# Patient Record
Sex: Female | Born: 1975 | Race: White | Hispanic: Yes | Marital: Married | Smoking: Never smoker
Health system: Southern US, Community
[De-identification: ages and names within clinical notes are randomized; demographics above are authoritative.]

## PROBLEM LIST (undated history)

## (undated) ENCOUNTER — Ambulatory Visit: Admission: EM | Payer: Self-pay | Source: Home / Self Care

## (undated) DIAGNOSIS — F32A Depression, unspecified: Secondary | ICD-10-CM

## (undated) DIAGNOSIS — K649 Unspecified hemorrhoids: Secondary | ICD-10-CM

## (undated) DIAGNOSIS — E7800 Pure hypercholesterolemia, unspecified: Secondary | ICD-10-CM

## (undated) DIAGNOSIS — F419 Anxiety disorder, unspecified: Secondary | ICD-10-CM

## (undated) DIAGNOSIS — F329 Major depressive disorder, single episode, unspecified: Secondary | ICD-10-CM

## (undated) DIAGNOSIS — R300 Dysuria: Secondary | ICD-10-CM

## (undated) HISTORY — DX: Pure hypercholesterolemia, unspecified: E78.00

## (undated) HISTORY — DX: Unspecified hemorrhoids: K64.9

## (undated) HISTORY — PX: HEMORRHOID SURGERY: SHX153

## (undated) HISTORY — DX: Dysuria: R30.0

---

## 2002-12-22 ENCOUNTER — Encounter: Payer: Self-pay | Admitting: Internal Medicine

## 2002-12-22 ENCOUNTER — Encounter: Admission: RE | Admit: 2002-12-22 | Discharge: 2002-12-22 | Payer: Self-pay | Admitting: Internal Medicine

## 2005-09-06 ENCOUNTER — Ambulatory Visit: Payer: Self-pay | Admitting: Internal Medicine

## 2005-09-10 ENCOUNTER — Ambulatory Visit: Payer: Self-pay | Admitting: *Deleted

## 2005-09-10 ENCOUNTER — Ambulatory Visit: Payer: Self-pay | Admitting: Nurse Practitioner

## 2005-09-11 ENCOUNTER — Ambulatory Visit (HOSPITAL_COMMUNITY): Admission: RE | Admit: 2005-09-11 | Discharge: 2005-09-11 | Payer: Self-pay | Admitting: Internal Medicine

## 2005-09-27 ENCOUNTER — Ambulatory Visit: Payer: Self-pay | Admitting: Internal Medicine

## 2005-10-25 ENCOUNTER — Ambulatory Visit: Payer: Self-pay | Admitting: Nurse Practitioner

## 2005-11-04 ENCOUNTER — Ambulatory Visit: Payer: Self-pay | Admitting: Nurse Practitioner

## 2005-11-11 ENCOUNTER — Ambulatory Visit: Payer: Self-pay | Admitting: Internal Medicine

## 2006-02-17 ENCOUNTER — Ambulatory Visit: Payer: Self-pay | Admitting: Nurse Practitioner

## 2006-03-03 ENCOUNTER — Ambulatory Visit: Payer: Self-pay | Admitting: Nurse Practitioner

## 2006-03-25 ENCOUNTER — Ambulatory Visit: Payer: Self-pay | Admitting: Nurse Practitioner

## 2006-04-09 ENCOUNTER — Ambulatory Visit: Payer: Self-pay | Admitting: Internal Medicine

## 2006-05-13 ENCOUNTER — Ambulatory Visit: Payer: Self-pay | Admitting: Internal Medicine

## 2006-06-10 ENCOUNTER — Ambulatory Visit: Payer: Self-pay | Admitting: Nurse Practitioner

## 2006-07-14 ENCOUNTER — Ambulatory Visit: Payer: Self-pay | Admitting: Internal Medicine

## 2006-07-30 ENCOUNTER — Ambulatory Visit: Payer: Self-pay | Admitting: Nurse Practitioner

## 2006-08-04 ENCOUNTER — Encounter: Admission: RE | Admit: 2006-08-04 | Discharge: 2006-08-04 | Payer: Self-pay | Admitting: Family Medicine

## 2007-05-06 ENCOUNTER — Encounter (INDEPENDENT_AMBULATORY_CARE_PROVIDER_SITE_OTHER): Payer: Self-pay | Admitting: *Deleted

## 2007-08-31 ENCOUNTER — Ambulatory Visit: Payer: Self-pay | Admitting: Internal Medicine

## 2007-09-28 ENCOUNTER — Ambulatory Visit: Payer: Self-pay | Admitting: Internal Medicine

## 2007-09-29 ENCOUNTER — Ambulatory Visit (HOSPITAL_COMMUNITY): Admission: RE | Admit: 2007-09-29 | Discharge: 2007-09-29 | Payer: Self-pay | Admitting: Internal Medicine

## 2007-12-10 ENCOUNTER — Ambulatory Visit: Payer: Self-pay | Admitting: Internal Medicine

## 2008-06-16 ENCOUNTER — Ambulatory Visit: Payer: Self-pay | Admitting: Internal Medicine

## 2008-06-20 ENCOUNTER — Ambulatory Visit: Payer: Self-pay | Admitting: *Deleted

## 2008-10-05 ENCOUNTER — Ambulatory Visit: Payer: Self-pay | Admitting: Internal Medicine

## 2008-11-14 ENCOUNTER — Encounter (INDEPENDENT_AMBULATORY_CARE_PROVIDER_SITE_OTHER): Payer: Self-pay | Admitting: General Surgery

## 2008-11-14 ENCOUNTER — Ambulatory Visit (HOSPITAL_BASED_OUTPATIENT_CLINIC_OR_DEPARTMENT_OTHER): Admission: RE | Admit: 2008-11-14 | Discharge: 2008-11-14 | Payer: Self-pay | Admitting: General Surgery

## 2009-01-10 ENCOUNTER — Ambulatory Visit: Payer: Self-pay | Admitting: Internal Medicine

## 2009-02-06 ENCOUNTER — Ambulatory Visit: Payer: Self-pay | Admitting: Internal Medicine

## 2009-02-15 ENCOUNTER — Ambulatory Visit: Payer: Self-pay | Admitting: Internal Medicine

## 2009-06-23 ENCOUNTER — Ambulatory Visit: Payer: Self-pay | Admitting: Internal Medicine

## 2009-07-24 ENCOUNTER — Ambulatory Visit: Payer: Self-pay | Admitting: Internal Medicine

## 2009-08-09 ENCOUNTER — Telehealth (INDEPENDENT_AMBULATORY_CARE_PROVIDER_SITE_OTHER): Payer: Self-pay | Admitting: *Deleted

## 2009-09-07 ENCOUNTER — Ambulatory Visit: Payer: Self-pay | Admitting: Internal Medicine

## 2009-09-25 ENCOUNTER — Ambulatory Visit: Payer: Self-pay | Admitting: Internal Medicine

## 2009-09-25 LAB — CONVERTED CEMR LAB
Cholesterol: 210 mg/dL — ABNORMAL HIGH (ref 0–200)
HDL: 58 mg/dL (ref >39)
LDL Cholesterol: 110 mg/dL — ABNORMAL HIGH (ref 0–99)
Total CHOL/HDL Ratio: 3.6
Triglycerides: 211 mg/dL — ABNORMAL HIGH (ref <150)
VLDL: 42 mg/dL — ABNORMAL HIGH (ref 0–40)

## 2009-10-05 ENCOUNTER — Ambulatory Visit: Payer: Self-pay | Admitting: Internal Medicine

## 2009-11-01 ENCOUNTER — Ambulatory Visit: Payer: Self-pay | Admitting: Internal Medicine

## 2009-11-01 LAB — CONVERTED CEMR LAB: Helicobacter Pylori Antibody-IgG: 3.9 — ABNORMAL HIGH

## 2009-12-21 ENCOUNTER — Ambulatory Visit: Payer: Self-pay | Admitting: Internal Medicine

## 2010-03-07 ENCOUNTER — Ambulatory Visit: Payer: Self-pay | Admitting: Internal Medicine

## 2010-03-08 ENCOUNTER — Encounter (INDEPENDENT_AMBULATORY_CARE_PROVIDER_SITE_OTHER): Payer: Self-pay | Admitting: Internal Medicine

## 2010-03-27 ENCOUNTER — Encounter (INDEPENDENT_AMBULATORY_CARE_PROVIDER_SITE_OTHER): Payer: Self-pay | Admitting: Family Medicine

## 2010-03-27 ENCOUNTER — Ambulatory Visit: Payer: Self-pay | Admitting: Internal Medicine

## 2010-06-18 ENCOUNTER — Encounter (INDEPENDENT_AMBULATORY_CARE_PROVIDER_SITE_OTHER): Payer: Self-pay | Admitting: *Deleted

## 2010-06-18 LAB — CONVERTED CEMR LAB
ALT: 18 U/L (ref 0–35)
AST: 15 U/L (ref 0–37)
Albumin: 4.3 g/dL (ref 3.5–5.2)
Alkaline Phosphatase: 43 U/L (ref 39–117)
BUN: 12 mg/dL (ref 6–23)
CO2: 27 meq/L (ref 19–32)
Calcium: 9.4 mg/dL (ref 8.4–10.5)
Chloride: 100 meq/L (ref 96–112)
Creatinine, Ser: 0.59 mg/dL (ref 0.40–1.20)
Glucose, Bld: 83 mg/dL (ref 70–99)
Potassium: 4.1 meq/L (ref 3.5–5.3)
Sodium: 138 meq/L (ref 135–145)
Total Bilirubin: 0.3 mg/dL (ref 0.3–1.2)
Total Protein: 7.1 g/dL (ref 6.0–8.3)

## 2010-08-19 NOTE — L&D Delivery Note (Signed)
Delivery Note At  a viable and healthy female was delivered via SVD (Presentation: LOA ).  APGAR: 9,9 ; weight 3,714 g .   Placenta status: spontaneous , via Tomasa Blase.  Cord: 3 vessels  with the following complications: None    Anesthesia:  None Episiotomy: None  Lacerations: None  Est. Blood Loss (mL): 250 mL  Mom to postpartum.  Baby to nursery-stable.  Jennifer Noble 05/26/2011, 9:00 PM

## 2010-09-09 ENCOUNTER — Encounter: Payer: Self-pay | Admitting: Internal Medicine

## 2010-09-19 NOTE — Miscellaneous (Signed)
 Summary: VIP  Patient: Jennifer Noble Note: All result statuses are Final unless otherwise noted.  Tests: (1) VIP (Medications)   LLIMPORTMEDS              Result Below...       RESULT: ZYRTEC ALLERGY TABS 10 MG*TOMAR UNA TABLETA DIARIA (TAKE ONE TABLET ONCE A DAY)*06/10/2006*Last Refill: Lwxwntw*[id number]***   LLIMPORTMEDS              Result Below...       RESULT: NAPROXEN  TABS 500 MG*TOMAR UNA TABLETA DOS VECES AL DIA DESPUES DE LAS COMIDAS SI LO NECISITA PARA DOLOR (TAKE 1 TABLET TWICE DAILY AFTER MEALS AS NEEDED FOR PAIN)*07/14/2006*Last Refill: Lwxwntw*[id number]*******   LLIMPORTMEDS              Result Below...       RESULT: DICLOFENAC SODIUM TBEC 75 MG*TOMAR UNA TABLETA DOS VECES AL DIA DESPUES DE LA COMIDAS (TAKE 1 TABLET 2 TIMES DAILY AFTER A MEAL)*05/01/2007*Last Refill: Lwxwntw*[id number]*******   LLIMPORTMEDS              Result Below...       RESULT: DESONIDE CREA 0.05 %*APLIQUE EN LAS AREAS AFECTADAS DOS VECES AL DIA POR 2 SEMANAS ENTONCES SI LA NECESITA (APPLY 2 TIMES A DAY FOR 2 WEEKS, THEN AS NEEDED)*06/10/2006*Last  Refill: Unknown*[id number]*******   LLIMPORTALLS              NKDA***  Note: An exclamation mark (!) indicates a result that was not dispersed into the flowsheet. Document Creation Date: 06/18/2007 3:00 PM _______________________________________________________________________  (1) Order result status: Final Collection or observation date-time: 05/06/2007 Requested date-time: 05/06/2007 Receipt date-time:  Reported date-time: 05/06/2007 Referring Physician:   Ordering Physician:   Specimen Source:  Source: [name] Order Number:  Lab site:

## 2010-09-19 NOTE — Progress Notes (Signed)
 Summary: triage-cold symptoms  Phone Note Call from Patient   Details for Reason: c/o 3 days of body aches, runny nose, flu symptoms. Has taken OTC flu cold medication with no improvement. Advised to use good hand hygine, ibu for body aches, can alternate with tylenol  if running fevers, plenty of fluids and rest. Advised to call office for worsening symptoms, respiratory distress call 911. This was communicated using interpretor Initial call taken by: Olam Alert RN,  August 09, 2009 2:59 PM

## 2010-10-12 ENCOUNTER — Ambulatory Visit: Payer: Self-pay | Admitting: Family Medicine

## 2010-10-12 DIAGNOSIS — Z0289 Encounter for other administrative examinations: Secondary | ICD-10-CM

## 2010-11-29 LAB — BASIC METABOLIC PANEL WITH GFR
BUN: 9 mg/dL (ref 6–23)
CO2: 25 meq/L (ref 19–32)
Calcium: 9.2 mg/dL (ref 8.4–10.5)
Chloride: 103 meq/L (ref 96–112)
Creatinine, Ser: 0.43 mg/dL (ref 0.4–1.2)
GFR calc Af Amer: >60 mL/min (ref >60)
GFR calc non Af Amer: >60 mL/min (ref >60)
Glucose, Bld: 116 mg/dL — ABNORMAL HIGH (ref 70–99)
Potassium: 3.9 meq/L (ref 3.5–5.1)
Sodium: 135 meq/L (ref 135–145)

## 2010-11-29 LAB — CBC
HCT: 38.5 % (ref 36.0–46.0)
Hemoglobin: 13.2 g/dL (ref 12.0–15.0)
MCHC: 34.4 g/dL (ref 30.0–36.0)
MCV: 90.9 fL (ref 78.0–100.0)
Platelets: 253 K/uL (ref 150–400)
RBC: 4.24 MIL/uL (ref 3.87–5.11)
RDW: 13.3 % (ref 11.5–15.5)
WBC: 10 K/uL (ref 4.0–10.5)

## 2010-11-29 LAB — DIFFERENTIAL
Basophils Absolute: 0 K/uL (ref 0.0–0.1)
Basophils Relative: 0 % (ref 0–1)
Eosinophils Absolute: 0.1 K/uL (ref 0.0–0.7)
Eosinophils Relative: 1 % (ref 0–5)
Lymphocytes Relative: 27 % (ref 12–46)
Lymphs Abs: 2.7 K/uL (ref 0.7–4.0)
Monocytes Absolute: 0.5 K/uL (ref 0.1–1.0)
Monocytes Relative: 5 % (ref 3–12)
Neutro Abs: 6.7 K/uL (ref 1.7–7.7)
Neutrophils Relative %: 67 % (ref 43–77)

## 2010-11-29 LAB — POCT HEMOGLOBIN-HEMACUE: Hemoglobin: 14.2 g/dL (ref 12.0–15.0)

## 2010-12-24 ENCOUNTER — Other Ambulatory Visit: Payer: Self-pay | Admitting: Family Medicine

## 2010-12-24 DIAGNOSIS — Z3689 Encounter for other specified antenatal screening: Secondary | ICD-10-CM

## 2010-12-24 LAB — ANTIBODY SCREEN: Antibody Screen: NEGATIVE

## 2010-12-24 LAB — HIV ANTIBODY (ROUTINE TESTING W REFLEX): HIV: NONREACTIVE

## 2010-12-24 LAB — ABO/RH: RH Type: POSITIVE

## 2010-12-24 LAB — RUBELLA ANTIBODY, IGM: Rubella: IMMUNE

## 2010-12-24 LAB — HEPATITIS B SURFACE ANTIGEN: Hepatitis B Surface Ag: NEGATIVE

## 2010-12-24 LAB — GC/CHLAMYDIA PROBE AMP, GENITAL: Chlamydia: NEGATIVE

## 2010-12-31 ENCOUNTER — Ambulatory Visit (HOSPITAL_COMMUNITY)
Admission: RE | Admit: 2010-12-31 | Discharge: 2010-12-31 | Disposition: A | Discharge status: Home / Self Care | Payer: Medicaid Other | Source: Ambulatory Visit | Attending: Family Medicine | Admitting: Family Medicine

## 2010-12-31 DIAGNOSIS — O358XX0 Maternal care for other (suspected) fetal abnormality and damage, not applicable or unspecified: Secondary | ICD-10-CM | POA: Insufficient documentation

## 2010-12-31 DIAGNOSIS — Z1389 Encounter for screening for other disorder: Secondary | ICD-10-CM | POA: Insufficient documentation

## 2010-12-31 DIAGNOSIS — Z3689 Encounter for other specified antenatal screening: Secondary | ICD-10-CM

## 2010-12-31 DIAGNOSIS — Z363 Encounter for antenatal screening for malformations: Secondary | ICD-10-CM | POA: Insufficient documentation

## 2011-01-01 NOTE — Op Note (Signed)
NAMEGENOVA, KINER  ACCOUNT NO.:  [id number]   MEDICAL RECORD NO.:  [id number]          PATIENT TYPE:  AMB   LOCATION:  DSC                          FACILITY:  [facility]   PHYSICIAN:  Juanetta Gosling, MDDATE OF BIRTH:  1976-01-04   DATE OF PROCEDURE:  DATE OF DISCHARGE:                               OPERATIVE REPORT   PREOPERATIVE DIAGNOSIS:  External hemorrhoids, status post incision and  drainage.   POSTOPERATIVE DIAGNOSIS:  External hemorrhoids, status post incision and  drainage.   PROCEDURES:  1. Exam under anesthesia with anoscopy.  2. External hemorrhoidectomy x1 column.   SURGEON:  Juanetta Gosling, MD   ASSISTANT:  None.   ANESTHESIA:  General.   FINDINGS:  Left lateral external hemorrhoidal skin tag.   SPECIMENS:  External hemorrhoidal skin tag to pathology.   ESTIMATED BLOOD LOSS:  Minimal.   COMPLICATIONS:  None.   DRAINS:  None.   DISPOSITION:  To recovery room in stable condition.   INDICATIONS:  Ms. [name] is a 35 year old female who in mid February  had a left lateral thrombosed external hemorrhoid that I incised and  evacuated clot at this time.  She still had some occasional discomfort  at this region as well as some itching around this area, some difficulty  with hygiene.  She returned desiring excision.  She and I talked for a  long time about conservative management for hemorrhoids, but she very  much wanted this area excised due to the hygiene issues.  We discussed  an external hemorrhoidectomy in this position.  I did discuss with her  before that this would not clear all her hemorrhoidal disease, just the  1 area that we could both identify that was bothering her at this point  and certainly she was at risk of having these areas again.   PROCEDURE IN DETAIL:  After informed consent was obtained, the patient  was taken to the operating room.  She was placed under general  anesthesia and rolled into the prone position.   She was appropriately  padded.  Sequential compression devices were placed on lower extremities  prior to induction.  Her buttocks were then taped apart and prepped and  draped in the standard sterile surgical fashion.  A surgical time-out was performed.  On external examination, she had a left lateral hemorrhoidal skin tag  from my prior clot evacuation as well as some very small anterior  hemorrhoidal disease.  We both identified the left-sided area as the  source of her current problems, and this certainly was the largest  hemorrhoidal disease.  I dilated her anus with 2 fingers.  I then placed  an anoscope.  She had very minimal internal hemorrhoidal disease  present, and there were no evidence of any other abnormalities on  anoscopy.  Following this, I then placed 2-0 chromic stitch at the apex  of this external and small internal hemorrhoidal complex.  I then used  electrocautery to remove this external hemorrhoidal complex off the  sphincter muscles.  I bovied several areas until they were hemostatic.  The specimen was then passed off the table.  I then did a running  chromic suture  that I locked and left the last 2 mm of this incision  open.  This was hemostatic upon completion.  I then infiltrated 10 mL of  0.25% Marcaine into a deep perianal block.  I also inserted a piece of  Gelfoam with dibucaine present on it overlying the incision.  She  tolerated this well, was extubated in the operating room, and  transferred to the recovery room in stable condition.      Juanetta Gosling, MD  Electronically Signed     MCW/MEDQ  D:  11/14/2008  T:  11/14/2008  Job:  [id number]

## 2011-01-18 ENCOUNTER — Inpatient Hospital Stay (HOSPITAL_COMMUNITY): Admission: AD | Admit: 2011-01-18 | Payer: Self-pay | Source: Ambulatory Visit | Admitting: Family Medicine

## 2011-01-21 ENCOUNTER — Other Ambulatory Visit: Payer: Self-pay | Admitting: Family Medicine

## 2011-01-21 DIAGNOSIS — Z0374 Encounter for suspected problem with fetal growth ruled out: Secondary | ICD-10-CM

## 2011-02-12 ENCOUNTER — Ambulatory Visit (HOSPITAL_COMMUNITY)
Admission: RE | Admit: 2011-02-12 | Discharge: 2011-02-12 | Disposition: A | Discharge status: Home / Self Care | Payer: Medicaid Other | Source: Ambulatory Visit | Attending: Family Medicine | Admitting: Family Medicine

## 2011-02-12 ENCOUNTER — Emergency Department (HOSPITAL_COMMUNITY)
Admission: EM | Admit: 2011-02-12 | Discharge: 2011-02-12 | Disposition: A | Discharge status: Home / Self Care | Payer: Medicaid Other | Attending: Emergency Medicine | Admitting: Emergency Medicine

## 2011-02-12 DIAGNOSIS — R6889 Other general symptoms and signs: Secondary | ICD-10-CM | POA: Insufficient documentation

## 2011-02-12 DIAGNOSIS — Z0374 Encounter for suspected problem with fetal growth ruled out: Secondary | ICD-10-CM

## 2011-02-12 DIAGNOSIS — Z3689 Encounter for other specified antenatal screening: Secondary | ICD-10-CM | POA: Insufficient documentation

## 2011-02-12 DIAGNOSIS — J029 Acute pharyngitis, unspecified: Secondary | ICD-10-CM | POA: Insufficient documentation

## 2011-02-12 DIAGNOSIS — R1310 Dysphagia, unspecified: Secondary | ICD-10-CM | POA: Insufficient documentation

## 2011-02-12 DIAGNOSIS — F458 Other somatoform disorders: Secondary | ICD-10-CM | POA: Insufficient documentation

## 2011-02-12 LAB — RAPID STREP SCREEN (MED CTR MEBANE ONLY): Streptococcus, Group A Screen (Direct): NEGATIVE

## 2011-02-21 ENCOUNTER — Ambulatory Visit (HOSPITAL_COMMUNITY): Payer: Self-pay

## 2011-05-02 LAB — STREP B DNA PROBE: GBS: NEGATIVE

## 2011-05-26 ENCOUNTER — Encounter (HOSPITAL_COMMUNITY): Payer: Self-pay

## 2011-05-26 ENCOUNTER — Inpatient Hospital Stay (HOSPITAL_COMMUNITY)
Admission: AD | Admit: 2011-05-26 | Discharge: 2011-05-28 | DRG: 775 | Disposition: A | Discharge status: Home / Self Care | Payer: Medicaid Other | Source: Ambulatory Visit | Attending: Obstetrics and Gynecology | Admitting: Obstetrics and Gynecology

## 2011-05-26 LAB — CBC
HCT: 38.1 % (ref 36.0–46.0)
Hemoglobin: 12.7 g/dL (ref 12.0–15.0)
MCH: 30.8 pg (ref 26.0–34.0)
MCHC: 33.3 g/dL (ref 30.0–36.0)
MCV: 92.5 fL (ref 78.0–100.0)
Platelets: 174 K/uL (ref 150–400)
RBC: 4.12 MIL/uL (ref 3.87–5.11)
RDW: 14.7 % (ref 11.5–15.5)
WBC: 11.5 K/uL — ABNORMAL HIGH (ref 4.0–10.5)

## 2011-05-26 LAB — SYPHILIS: RPR W/REFLEX TO RPR TITER AND TREPONEMAL ANTIBODIES, TRADITIONAL SCREENING AND DIAGNOSIS ALGORITHM: RPR Ser Ql: NONREACTIVE

## 2011-05-26 MED ORDER — NALBUPHINE SYRINGE 5 MG/0.5 ML
10.0000 mg | INJECTION | INTRAMUSCULAR | Status: DC | PRN
  Administered 2011-05-26 (×2): 10 mg via INTRAVENOUS
  Filled 2011-05-26 (×2): qty 1

## 2011-05-26 MED ORDER — LACTATED RINGERS IV SOLN: 500.0000 mL | INTRAVENOUS | Status: DC | PRN

## 2011-05-26 MED ORDER — OXYTOCIN 20 UNITS IN LACTATED RINGERS INFUSION - SIMPLE: 125.0000 mL/h | Freq: Once | INTRAVENOUS | Status: DC

## 2011-05-26 MED ORDER — WITCH HAZEL-GLYCERIN EX PADS: 1.0000 "application " | MEDICATED_PAD | CUTANEOUS | Status: DC | PRN

## 2011-05-26 MED ORDER — OXYCODONE-ACETAMINOPHEN 5-325 MG PO TABS: 2.0000 | ORAL_TABLET | ORAL | Status: DC | PRN

## 2011-05-26 MED ORDER — OXYTOCIN BOLUS FROM INFUSION
500.0000 mL | Freq: Once | INTRAVENOUS | Status: DC
  Administered 2011-05-26: 500 mL via INTRAVENOUS
  Filled 2011-05-26: qty 500
  Filled 2011-05-26: qty 1000

## 2011-05-26 MED ORDER — FLEET ENEMA 7-19 GM/118ML RE ENEM: 1.0000 | ENEMA | RECTAL | Status: DC | PRN

## 2011-05-26 MED ORDER — LANOLIN HYDROUS EX OINT: TOPICAL_OINTMENT | CUTANEOUS | Status: DC | PRN

## 2011-05-26 MED ORDER — LIDOCAINE HCL (PF) 1 % IJ SOLN
30.0000 mL | INTRAMUSCULAR | Status: DC | PRN
  Filled 2011-05-26: qty 30

## 2011-05-26 MED ORDER — LACTATED RINGERS IV SOLN
INTRAVENOUS | Status: DC
  Administered 2011-05-26: 17:00:00 via INTRAVENOUS

## 2011-05-26 MED ORDER — SIMETHICONE 80 MG PO CHEW: 80.0000 mg | CHEWABLE_TABLET | ORAL | Status: DC | PRN

## 2011-05-26 MED ORDER — IBUPROFEN 600 MG PO TABS
600.0000 mg | ORAL_TABLET | Freq: Four times a day (QID) | ORAL | Status: DC
  Administered 2011-05-26 – 2011-05-28 (×7): 600 mg via ORAL
  Filled 2011-05-26 (×7): qty 1

## 2011-05-26 MED ORDER — ZOLPIDEM TARTRATE 5 MG PO TABS: 5.0000 mg | ORAL_TABLET | Freq: Every evening | ORAL | Status: DC | PRN

## 2011-05-26 MED ORDER — PRENATAL PLUS 27-1 MG PO TABS
1.0000 | ORAL_TABLET | Freq: Every day | ORAL | Status: DC
  Administered 2011-05-27 – 2011-05-28 (×3): 1 via ORAL
  Filled 2011-05-26 (×3): qty 1

## 2011-05-26 MED ORDER — ONDANSETRON HCL 4 MG PO TABS: 4.0000 mg | ORAL_TABLET | ORAL | Status: DC | PRN

## 2011-05-26 MED ORDER — SENNOSIDES-DOCUSATE SODIUM 8.6-50 MG PO TABS
2.0000 | ORAL_TABLET | Freq: Every day | ORAL | Status: DC
  Administered 2011-05-27: 2 via ORAL

## 2011-05-26 MED ORDER — DIBUCAINE 1 % RE OINT: 1.0000 "application " | TOPICAL_OINTMENT | RECTAL | Status: DC | PRN

## 2011-05-26 MED ORDER — DIPHENHYDRAMINE HCL 25 MG PO CAPS: 25.0000 mg | ORAL_CAPSULE | Freq: Four times a day (QID) | ORAL | Status: DC | PRN

## 2011-05-26 MED ORDER — TETANUS-DIPHTH-ACELL PERTUSSIS 5-2.5-18.5 LF-MCG/0.5 IM SUSP
0.5000 mL | Freq: Once | INTRAMUSCULAR | Status: AC
  Administered 2011-05-27: 0.5 mL via INTRAMUSCULAR
  Filled 2011-05-26: qty 0.5

## 2011-05-26 MED ORDER — ONDANSETRON HCL 4 MG/2ML IJ SOLN: 4.0000 mg | INTRAMUSCULAR | Status: DC | PRN

## 2011-05-26 MED ORDER — ONDANSETRON HCL 4 MG/2ML IJ SOLN: 4.0000 mg | Freq: Four times a day (QID) | INTRAMUSCULAR | Status: DC | PRN

## 2011-05-26 MED ORDER — OXYCODONE-ACETAMINOPHEN 5-325 MG PO TABS: 1.0000 | ORAL_TABLET | ORAL | Status: DC | PRN

## 2011-05-26 MED ORDER — ACETAMINOPHEN 325 MG PO TABS: 650.0000 mg | ORAL_TABLET | ORAL | Status: DC | PRN

## 2011-05-26 MED ORDER — IBUPROFEN 600 MG PO TABS: 600.0000 mg | ORAL_TABLET | Freq: Four times a day (QID) | ORAL | Status: DC | PRN

## 2011-05-26 MED ORDER — CITRIC ACID-SODIUM CITRATE 334-500 MG/5ML PO SOLN: 30.0000 mL | ORAL | Status: DC | PRN

## 2011-05-26 MED ORDER — BENZOCAINE-MENTHOL 20-0.5 % EX AERO: 1.0000 "application " | INHALATION_SPRAY | CUTANEOUS | Status: DC | PRN

## 2011-05-26 NOTE — Progress Notes (Signed)
Interpreter at bedside.

## 2011-05-26 NOTE — Progress Notes (Signed)
Marlynn Perking CNM notified of patient arrival for labor eval. Notified of cervical exam. Plan to monitor patient times 1 hr and re-check.

## 2011-05-26 NOTE — H&P (Signed)
Jennifer Noble is a 35 y.o. female presenting for labor. Maternal Medical History:  Reason for admission: Reason for admission: contractions.  Contractions: Onset was 3-5 hours ago.   Frequency: regular.   Perceived severity is moderate.    Fetal activity: Perceived fetal activity is normal.   Last perceived fetal movement was within the past hour.      OB History    Grav Para Term Preterm Abortions TAB SAB Ect Mult Living   3 2 2  0 0 0 0 0 0 2     No past medical history on file. No past surgical history on file. Family History: family history is not on file. Social History:  does not have a smoking history on file. She does not have any smokeless tobacco history on file. Her alcohol and drug histories not on file.  Review of Systems  Constitutional: Negative.   HENT: Negative.   Eyes: Negative.   Respiratory: Negative.   Cardiovascular: Negative.   Gastrointestinal: Positive for abdominal pain.  Genitourinary: Negative.   Musculoskeletal: Negative.   Skin: Negative.   Neurological: Negative.   Psychiatric/Behavioral: Negative.     Dilation: 4 Effacement (%): 80 Station: -3;Ballotable Exam by:: J. Rasch RN  Blood pressure 126/66, pulse 81, temperature 98.9 F (37.2 C), temperature source Oral, resp. rate 16, height 5\' 3"  (1.6 m), weight 74.571 kg (164 lb 6.4 oz). Maternal Exam:  Uterine Assessment: Contraction strength is moderate.  Contraction frequency is regular.   Abdomen: Patient reports no abdominal tenderness. Fetal presentation: vertex  Introitus: Normal vulva. Normal vagina.    Physical Exam  Constitutional: She is oriented to person, place, and time. She appears well-developed and well-nourished.  HENT:  Head: Normocephalic.  Neck: Normal range of motion.  Cardiovascular: Normal rate, regular rhythm and normal heart sounds.   Respiratory: Effort normal and breath sounds normal.  GI: Soft. Bowel sounds are normal.  Genitourinary: Vagina  normal and uterus normal.  Musculoskeletal: Normal range of motion.  Neurological: She is alert and oriented to person, place, and time. She has normal reflexes.  Skin: Skin is warm and dry.  Psychiatric: She has a normal mood and affect. Her behavior is normal. Judgment and thought content normal.    Prenatal labs: ABO, Rh:   Antibody:   Rubella:   RPR:    HBsAg:    HIV:    GBS:     Assessment/Plan: Cervix now 4-5/9-/-1. Will admitin labor.   Jennifer Noble 05/26/2011, 3:05 PM

## 2011-05-26 NOTE — Progress Notes (Signed)
Contractions since 0200 this morning, G3P2   39.4 weeks EDC 05/29/11 dark black blood

## 2011-05-26 NOTE — Progress Notes (Signed)
05/26/11 1940  Pain Management  Is pain due to contractions? Yes  Labor Response Breathing well through contractions  Observed Emotional State Accepting  interpreter at bedside. Pt states nubian is not helping. Pt informed about pain management options, refused epidural at this time.

## 2011-05-26 NOTE — Progress Notes (Signed)
Marlynn Perking CNM notified of patient cervical exam 4, 80, -3, ballotable. Plan to offer patient to walk times 1 hr.

## 2011-05-26 NOTE — Progress Notes (Signed)
Jennifer Noble is a 35 y.o. G3P2002 at [redacted]w[redacted]d by ultrasound admitted labor.   Subjective: Patient without complaints.  Feels contractions Q 3 minutes.  Received some Nubain for pain about 1 hour ago, doing well now.    Objective: BP 121/70  Pulse 75  Temp(Src) 97.9 F (36.6 C) (Oral)  Resp 18  Ht 5\' 3"  (1.6 m)  Wt 164 lb 6.4 oz (74.571 kg)  BMI 29.12 kg/m2      FHT:  FHR: 150 bpm, variability: moderate,  accelerations:  Present,  decelerations:  Absent UC:   regular, every 3-5 minutes SVE:   Dilation: 5 Effacement (%): 80 Station: -2 Exam by:: Dr. Gwendolyn Grant  Labs: Lab Results  Component Value Date   WBC 11.5* 05/26/2011   HGB 12.7 05/26/2011   HCT 38.1 05/26/2011   MCV 92.5 05/26/2011   PLT 174 05/26/2011    Assessment / Plan: Spontaneous labor, progressing normally AROM at 1630, moderate meconium.  Still with minimal forebag, ruptured remaining forebag this examination.  Moderate meconium persists.    Labor: Progressing normally  Preeclampsia:  no signs or symptoms of toxicity Fetal Wellbeing:  Category I Pain Control:  Nubain x 1 dose I/D:  n/a Anticipated MOD:  NSVD  Jennifer Noble,JEFF 05/26/2011, 6:43 PM

## 2011-05-26 NOTE — Progress Notes (Signed)
Pt here for labor evaluation. Pt is G3P2 at 39.4 weeks.

## 2011-05-27 NOTE — Progress Notes (Signed)
Post Partum Day 1 Subjective: up ad lib, voiding, tolerating PO and mild abdominal cramping  Objective: Blood pressure 109/63, pulse 75, temperature 97.8 F (36.6 C), temperature source Oral, resp. rate 18, height 5\' 3"  (1.6 m), weight 164 lb 6.4 oz (74.571 kg), SpO2 97.00%, unknown if currently breastfeeding.  Physical Exam:  General: alert, cooperative, appears stated age and no distress Lochia: appropriate Uterine Fundus: firm DVT Evaluation: No evidence of DVT seen on physical exam.   Basename 05/26/11 1640  HGB 12.7  HCT 38.1    Assessment/Plan: Plan for discharge tomorrow, Breastfeeding and Contraception patient would like to know about possibility of BTL; She receives care at [facility]    LOS: 1 day   Mat Carne 05/27/2011, 5:25 AM

## 2011-05-27 NOTE — Progress Notes (Signed)
UR chart review completed.  

## 2011-05-28 MED ORDER — PRENATAL PLUS 27-1 MG PO TABS: 1.0000 | ORAL_TABLET | Freq: Every day | ORAL | Status: DC

## 2011-05-28 MED ORDER — IBUPROFEN 600 MG PO TABS: 600.0000 mg | ORAL_TABLET | Freq: Four times a day (QID) | ORAL | Status: AC

## 2011-05-28 NOTE — Discharge Instructions (Signed)
Nacimiento . Marland Kitchen Si tuvo un parto vaginal Cuidados luego (Birth - Vaginal Delivery, Care Following) 1. Cambie el apsito cada vez que vaya al bao.  1. Lmpiese suavemente con un papel tis durante su estada en el hospital, y siempre hgalo desde adelante hacia atrs. Puede utilizar un rociador con agua caliente del grifo o el tis descartable, o ambos.  1. Coloque el apsito y el tis sucios en el cesto de basura del bao, en la bolsa plstica de color rojo.  4. Durante su permanencia en el hospital, guarde los cogulos. Si elimina un cogulo, por favor no tire la  cadena. Tambin, si su flujo vaginal le parece excesivo, notifquelo al personal de enfermera. 5. La primera vez que se levante de la cama despus del parto, espere la ayuda de la enfermera. No se levante  sola en ningn momento si se siente dbil o mareada. 6. Flexione y extienda los tobillos vigorosamente, de modo que pueda sentir que las pantorrillas se endurecen,  media docena de veces por hora, cuando est en la cama y despierta. 7. No se siente sobre un pie suyo ni deje las piernas colgando del borde de la cama ni mantenga una posicin  que dificulte la circulacin de las piernas. 8. Muchas mujeres experimentan dolores de entuerto (contracciones uterinas leves) en los dos o tres das  despus del [place]. Pdale a la enfermera un medicamento contra el dolor si lo necesita. Algunas veces la  alimentacin a pecho estimula los entuertos; si le ocurre esto, pida la medicacin  -  de hora antes de la  prxima vez que alimente al beb.  9. Para su proteccin y la de su beb, le pedimos que no traspase las puertas dobles de la entrada de la unidad  de obstetricia. No lleve al beb en brazos por el corredor. Cuando saque o entre al beb de la habitacin, por  favor colquelo en el cochecito y empjelo. 10. Las mams pueden tener a sus bebs en la habitacin todo lo que deseen.  Document Released: 08/05/2005 Document Re-Released:  01/22/2008 [facility] Patient Information 2011 [place], [place].

## 2011-05-28 NOTE — Progress Notes (Signed)
Post Partum Day 3 Subjective: no complaints  Objective: Temp:  [97.9 F (36.6 C)-98.9 F (37.2 C)] 97.9 F (36.6 C) (10/09 0556) Pulse Rate:  [80-87] 80  (10/09 0556) Resp:  [18] 18  (10/09 0556) BP: (99-102)/(63-74) 99/65 mmHg (10/09 0556)  Physical Exam:  General: alert, cooperative and no distress Lochia: appropriate Uterine Fundus: firm DVT Evaluation: No evidence of DVT seen on physical exam.   Basename 05/26/11 1640  HGB 12.7  HCT 38.1    Assessment/Plan: Discharge home, Breastfeeding and Contraception Patient would like bilateral tubal ligation, but does not have Medicaid. Therefore, she will need to address this in the outpatient setting with her PCP at the health department.    LOS: 2 days   Mat Carne 05/28/2011, 9:11 AM

## 2011-05-28 NOTE — Discharge Summary (Signed)
Obstetric Discharge Summary Reason for Admission: onset of labor Prenatal Procedures: none Intrapartum Procedures: spontaneous vaginal delivery Postpartum Procedures: none Complications-Operative and Postpartum: none Hemoglobin  Date Value Range Status  05/26/2011 12.7  12.0-15.0 (g/dL) Final     HCT  Date Value Range Status  05/26/2011 38.1  36.0-46.0 (%) Final    Discharge Diagnoses: Term Pregnancy-delivered  Discharge Information: Date: 05/28/2011 Activity: pelvic rest Diet: routine Medications: PNV and Ibuprofen Condition: stable Instructions: refer to practice specific booklet Discharge to: home Follow-up Information    Make an appointment with [facility] [place]. (You need to be seen in 4-6 weeks for your postpartum visit.)    Contact information:   [address] [place] [address]          Newborn Data: Live born female  Birth Weight: 8 lb 3 oz (3714 g) APGAR: 9, 9  Home with mother.  Cam Hai 05/28/2011, 12:02 PM

## 2011-05-28 NOTE — Discharge Summary (Signed)
Obstetric Discharge Summary  Reason for Admission: onset of labor  Prenatal Procedures: none  Intrapartum Procedures: spontaneous vaginal delivery  Postpartum Procedures: none  Complications-Operative and Postpartum: none    Hemoglobin    Date  Value  Range  Status    05/26/2011  12.7  12.0-15.0 (g/dL)  Final       HCT    Date  Value  Range  Status    05/26/2011  38.1  36.0-46.0 (%)  Final     Discharge Diagnoses: Term Pregnancy-delivered  Discharge Information:  Date: 05/28/2011  Activity: pelvic rest  Diet: routine  Medications: PNV and Ibuprofen  Condition: stable  Instructions: refer to practice specific booklet  Discharge to: home    Follow-up Information     Make an appointment with [facility] [place]. (You need to be seen in 4-6 weeks for your postpartum visit.)     Contact information:     [address]  [place] [address]            Newborn Data:  Live born female  Birth Weight: 8 lb 3 oz (3714 g)  APGAR: 9, 9  Home with mother.         Clinton Sawyer, Ronesha Heenan 05/28/2011, 3:18 AM

## 2013-04-08 ENCOUNTER — Encounter: Payer: Self-pay | Admitting: Cardiovascular Disease

## 2013-04-08 ENCOUNTER — Ambulatory Visit (INDEPENDENT_AMBULATORY_CARE_PROVIDER_SITE_OTHER): Payer: Self-pay | Admitting: Cardiovascular Disease

## 2013-04-08 ENCOUNTER — Encounter: Payer: Self-pay | Admitting: *Deleted

## 2013-04-08 VITALS — BP 102/63 | HR 75 | Wt 144.0 lb

## 2013-04-08 DIAGNOSIS — R300 Dysuria: Secondary | ICD-10-CM

## 2013-04-08 DIAGNOSIS — Z8249 Family history of ischemic heart disease and other diseases of the circulatory system: Secondary | ICD-10-CM | POA: Insufficient documentation

## 2013-04-08 DIAGNOSIS — E785 Hyperlipidemia, unspecified: Secondary | ICD-10-CM | POA: Insufficient documentation

## 2013-04-08 DIAGNOSIS — K649 Unspecified hemorrhoids: Secondary | ICD-10-CM | POA: Insufficient documentation

## 2013-04-08 NOTE — Patient Instructions (Addendum)
Your physician recommends that you continue on your current medications as directed. Please refer to the Current Medication list given to you today.  Your physician recommends that you schedule a follow-up appointment in: as needed  

## 2013-04-08 NOTE — Assessment & Plan Note (Signed)
Asymptomatic yound hispanic female.  No indication for stress testing in absence of symptoms.  Rx risk factors  Could have calcium score but this is a $150 dollar out of pocket expense that she cannot afford

## 2013-04-08 NOTE — Assessment & Plan Note (Signed)
Per primary continue Rx  F/u labs in 2 months  Although recent guidelines suggest diet Rx appropriate

## 2013-04-08 NOTE — Progress Notes (Signed)
Patient ID: Jennifer Noble, female   DOB: 09-May-1976, 37 y.o.   MRN: [id number] 37 yo referred by primary for family history of CAD and elevated lipids  She has no symptoms.  She does not work From [place] 14 years ago and cares for 3 children Doesn't speak much english and daughter helps with history. No chest pain palpitatoins dyspnea or syncope.  TC 186 with LDL 124 started on chol Rx 4 months ago with no side effects.  She does not smoke.    ROS: Denies fever, malais, weight loss, blurry vision, decreased visual acuity, cough, sputum, SOB, hemoptysis, pleuritic pain, palpitaitons, heartburn, abdominal pain, melena, lower extremity edema, claudication, or rash.  All other systems reviewed and negative   General: Affect appropriate Healthy:  appears stated age HEENT: normal Neck supple with no adenopathy JVP normal no bruits no thyromegaly Lungs clear with no wheezing and good diaphragmatic motion Heart:  S1/S2 no murmur,rub, gallop or click PMI normal Abdomen: benighn, BS positve, no tenderness, no AAA no bruit.  No HSM or HJR Distal pulses intact with no bruits No edema Neuro non-focal Skin warm and dry No muscular weakness  Medications Current Outpatient Prescriptions  Medication Sig Dispense Refill  . FLUoxetine (PROZAC) 10 MG capsule Take 10 mg by mouth daily.      Marland Kitchen omeprazole (PRILOSEC) 20 MG capsule Take 20 mg by mouth daily.      . pravastatin (PRAVACHOL) 10 MG tablet Take 10 mg by mouth daily.       No current facility-administered medications for this visit.    Allergies Review of patient's allergies indicates no known allergies.  Family History: Family History  Problem Relation Age of Onset  . Diabetes Mother   . Heart disease      Social History: History   Social History  . Marital Status: Married    Spouse Name: N/A    Number of Children: N/A  . Years of Education: N/A   Occupational History  . Not on file.   Social History Main Topics  .  Smoking status: Never Smoker   . Smokeless tobacco: Not on file  . Alcohol Use: No  . Drug Use: No  . Sexual Activity: No   Other Topics Concern  . Not on file   Social History Narrative  . No narrative on file    Electrocardiogram:  NSR rate 75  Low voltage nonspecific ST/T wave changes  Assessment and Plan

## 2014-02-23 ENCOUNTER — Other Ambulatory Visit: Payer: Self-pay | Admitting: Primary Care

## 2014-02-23 DIAGNOSIS — N644 Mastodynia: Secondary | ICD-10-CM

## 2014-03-02 ENCOUNTER — Ambulatory Visit
Admission: RE | Admit: 2014-03-02 | Discharge: 2014-03-02 | Disposition: A | Discharge status: Home / Self Care | Payer: No Typology Code available for payment source | Source: Ambulatory Visit | Attending: Primary Care | Admitting: Primary Care

## 2014-03-02 ENCOUNTER — Encounter (INDEPENDENT_AMBULATORY_CARE_PROVIDER_SITE_OTHER): Payer: Self-pay

## 2014-03-02 DIAGNOSIS — N644 Mastodynia: Secondary | ICD-10-CM

## 2014-06-20 ENCOUNTER — Encounter: Payer: Self-pay | Admitting: *Deleted

## 2014-08-23 ENCOUNTER — Other Ambulatory Visit: Payer: Self-pay | Admitting: Primary Care

## 2014-08-23 DIAGNOSIS — R921 Mammographic calcification found on diagnostic imaging of breast: Secondary | ICD-10-CM

## 2014-09-05 ENCOUNTER — Other Ambulatory Visit: Payer: Self-pay | Admitting: Primary Care

## 2014-09-05 ENCOUNTER — Ambulatory Visit
Admission: RE | Admit: 2014-09-05 | Discharge: 2014-09-05 | Disposition: A | Discharge status: Home / Self Care | Payer: No Typology Code available for payment source | Source: Ambulatory Visit | Attending: Primary Care | Admitting: Primary Care

## 2014-09-05 DIAGNOSIS — R921 Mammographic calcification found on diagnostic imaging of breast: Secondary | ICD-10-CM

## 2014-09-12 ENCOUNTER — Ambulatory Visit
Admission: RE | Admit: 2014-09-12 | Discharge: 2014-09-12 | Disposition: A | Discharge status: Home / Self Care | Payer: No Typology Code available for payment source | Source: Ambulatory Visit | Attending: Primary Care | Admitting: Primary Care

## 2014-09-12 DIAGNOSIS — R921 Mammographic calcification found on diagnostic imaging of breast: Secondary | ICD-10-CM

## 2015-02-08 ENCOUNTER — Other Ambulatory Visit: Payer: Self-pay | Admitting: Primary Care

## 2015-02-08 DIAGNOSIS — N6310 Unspecified lump in the right breast, unspecified quadrant: Secondary | ICD-10-CM

## 2015-03-06 ENCOUNTER — Other Ambulatory Visit: Payer: Self-pay

## 2015-03-07 ENCOUNTER — Ambulatory Visit
Admission: RE | Admit: 2015-03-07 | Discharge: 2015-03-07 | Disposition: A | Discharge status: Home / Self Care | Payer: No Typology Code available for payment source | Source: Ambulatory Visit | Attending: Primary Care | Admitting: Primary Care

## 2015-03-07 DIAGNOSIS — N6310 Unspecified lump in the right breast, unspecified quadrant: Secondary | ICD-10-CM

## 2015-08-06 ENCOUNTER — Emergency Department (HOSPITAL_COMMUNITY)
Admission: EM | Admit: 2015-08-06 | Discharge: 2015-08-06 | Disposition: A | Discharge status: Home / Self Care | Payer: No Typology Code available for payment source | Attending: Emergency Medicine | Admitting: Emergency Medicine

## 2015-08-06 ENCOUNTER — Encounter (HOSPITAL_COMMUNITY): Payer: Self-pay | Admitting: *Deleted

## 2015-08-06 DIAGNOSIS — F419 Anxiety disorder, unspecified: Secondary | ICD-10-CM | POA: Insufficient documentation

## 2015-08-06 DIAGNOSIS — Z8719 Personal history of other diseases of the digestive system: Secondary | ICD-10-CM | POA: Insufficient documentation

## 2015-08-06 DIAGNOSIS — F329 Major depressive disorder, single episode, unspecified: Secondary | ICD-10-CM | POA: Insufficient documentation

## 2015-08-06 DIAGNOSIS — R51 Headache: Secondary | ICD-10-CM | POA: Insufficient documentation

## 2015-08-06 DIAGNOSIS — Z79899 Other long term (current) drug therapy: Secondary | ICD-10-CM | POA: Insufficient documentation

## 2015-08-06 DIAGNOSIS — M5412 Radiculopathy, cervical region: Secondary | ICD-10-CM

## 2015-08-06 MED ORDER — TRAMADOL HCL 50 MG PO TABS: 50.0000 mg | ORAL_TABLET | Freq: Four times a day (QID) | ORAL | Status: DC | PRN

## 2015-08-06 MED ORDER — DEXAMETHASONE SODIUM PHOSPHATE 10 MG/ML IJ SOLN
10.0000 mg | Freq: Once | INTRAMUSCULAR | Status: AC
  Administered 2015-08-06: 10 mg via INTRAMUSCULAR
  Filled 2015-08-06: qty 1

## 2015-08-06 MED ORDER — IBUPROFEN 800 MG PO TABS: 800.0000 mg | ORAL_TABLET | Freq: Three times a day (TID) | ORAL | Status: DC

## 2015-08-06 MED ORDER — PREDNISONE 20 MG PO TABS: ORAL_TABLET | ORAL | Status: DC

## 2015-08-06 MED ORDER — KETOROLAC TROMETHAMINE 60 MG/2ML IM SOLN
60.0000 mg | Freq: Once | INTRAMUSCULAR | Status: AC
  Administered 2015-08-06: 60 mg via INTRAMUSCULAR
  Filled 2015-08-06: qty 2

## 2015-08-06 NOTE — ED Notes (Signed)
Patient presents stating she has been having neck and head pain for 3 months but has gotten worse over the past week.  Has seen 3 MD's for the same and has been taking the meds without relief

## 2015-08-06 NOTE — ED Notes (Signed)
Pt c/o worsening neck pain over the past couple or days. Also reports inability to sleep due to pain in neck. Pt has seen other doctors and given medications for insomnia and anxiety without improvement of sleep. Pt reports pain radiates into both arms and worse with movement

## 2015-08-06 NOTE — Discharge Instructions (Signed)
Radiculopata cervical (Cervical Radiculopathy) El trmino radiculopata cervical significa que un nervio del cuello est comprimido o lesionado. Esto puede causar dolor o la prdida de la sensibilidad (adormecimiento) que se extiende desde el cuello hasta el brazo y los dedos de la [place]. CUIDADOS EN EL HOGAR Control del W. R. Berkleydolor  Tome los medicamentos de venta libre y los recetados solamente como se lo haya indicado el mdico.  Si se lo indican, coloque hielo en la zona del dolor o la lesin.  Ponga el hielo en una bolsa plstica.  Coloque una toalla entre la piel y la bolsa de hielo.  Coloque el hielo durante 20minutos, 2 a 3veces por Futures traderda.  Si el hielo no le Research scientist (life sciences)alivia el dolor, intente Company secretaryaplicarse calor. Tome una ducha o un bao tibios, o colquese una compresa de Museum/gallery curatorcalor como se lo haya indicado el mdico.  Puede intentar darse un masaje suave en el cuello y el hombro. Actividad  Descanse todo lo que sea necesario. Siga las indicaciones del mdico respecto de las actividades que Personnel officerdebe evitar.  Haga ejercicios como se lo hayan indicado el mdico o el fisioterapeuta. Instrucciones generales   Si le indicaron que use un collarn blando, selo como se lo haya indicado el mdico.  Use una almohada plana para dormir.  Concurra a todas las visitas de control como se lo haya indicado el mdico. Esto es importante. SOLICITE AYUDA SI:  La afeccin no mejora con tratamiento. SOLICITE AYUDA DE INMEDIATO SI:   El dolor empeora y no se alivia con los United Parcelmedicamentos.  Pierde la sensibilidad o siente debilidad en la mano, el brazo, el rostro o la pierna.  Tiene fiebre.  Presenta rigidez en el cuello.  No puede controlar la vejiga o los intestinos (tiene incontinencia).  Tiene dificultad para caminar, mantener el equilibrio o hablar.   Esta informacin no tiene Theme park managercomo fin reemplazar el consejo del mdico. Asegrese de hacerle al mdico cualquier pregunta que tenga.   Document Released:  07/25/2011 Document Revised: 04/26/2015 Elsevier Interactive Patient Education [employer]2016 Elsevier Inc.

## 2015-08-06 NOTE — ED Provider Notes (Signed)
CSN: [id number]     Arrival date & time 08/06/15  0424 History   First MD Initiated Contact with Patient 08/06/15 [phone]     Chief Complaint  Patient presents with  . Headache  . Neck Pain     (Consider location/radiation/quality/duration/timing/severity/associated sxs/prior Treatment) HPI Comments: Patient presents with multiple problems. Patient is complaining of neck pain that has been ongoing for 3 months. She has seen several doctors for this problem and has been given medicines that have not helped. Patient reports that she has been experiencing anxiety. She at times feels like she is hot on the inside and cold on the outside. She has not been able to sleep. Insomnia is mostly secondary to the pain in her neck. Pain is constant, worsens with movement and radiates down her arms. She has not had weakness in the arms. There has not been any injury.  Patient is a 39 y.o. female presenting with headaches and neck pain.  Headache Associated symptoms: neck pain   Neck Pain Associated symptoms: headaches     Past Medical History  Diagnosis Date  . Hemorrhoid   . Dysuria    History reviewed. No pertinent past surgical history. Family History  Problem Relation Age of Onset  . Diabetes Mother   . Heart disease     Social History  Substance Use Topics  . Smoking status: Never Smoker   . Smokeless tobacco: Never Used  . Alcohol Use: No   OB History    Gravida Para Term Preterm AB TAB SAB Ectopic Multiple Living   0 0 0 0 0 0 3     Review of Systems  Musculoskeletal: Positive for neck pain.  Neurological: Positive for headaches.  All other systems reviewed and are negative.     Allergies  Review of patient's allergies indicates no known allergies.  Home Medications   Prior to Admission medications   Medication Sig Start Date End Date Taking? Authorizing Provider  FLUoxetine (PROZAC) 10 MG capsule Take 10 mg by mouth daily.    Historical Provider, MD  omeprazole  (PRILOSEC) 20 MG capsule Take 20 mg by mouth daily.    Historical Provider, MD  pravastatin (PRAVACHOL) 10 MG tablet Take 10 mg by mouth daily.    Historical Provider, MD   BP 126/78 mmHg  Pulse 94  Temp(Src) 98.5 F (36.9 C) (Oral)  Resp 16  SpO2 98% Physical Exam  Constitutional: She is oriented to person, place, and time. She appears well-developed and well-nourished. No distress.  HENT:  Head: Normocephalic and atraumatic.  Right Ear: Hearing normal.  Left Ear: Hearing normal.  Nose: Nose normal.  Mouth/Throat: Oropharynx is clear and moist and mucous membranes are normal.  Eyes: Conjunctivae and EOM are normal. Pupils are equal, round, and reactive to light.  Neck: Neck supple. Muscular tenderness present. Decreased range of motion (due to pain) present.  Cardiovascular: Regular rhythm, S1 normal and S2 normal.  Exam reveals no gallop and no friction rub.   No murmur heard. Pulmonary/Chest: Effort normal and breath sounds normal. No respiratory distress. She exhibits no tenderness.  Abdominal: Soft. Normal appearance and bowel sounds are normal. There is no hepatosplenomegaly. There is no tenderness. There is no rebound, no guarding, no tenderness at McBurney's point and negative Murphy's sign. No hernia.  Neurological: She is alert and oriented to person, place, and time. She has normal strength. No cranial nerve deficit or sensory deficit. Coordination normal. GCS eye subscore is 4. GCS  verbal subscore is 5. GCS motor subscore is 6.  Skin: Skin is warm, dry and intact. No rash noted. No cyanosis.  Psychiatric: She has a normal mood and affect. Her speech is normal and behavior is normal. Thought content normal.  Nursing note and vitals reviewed.   ED Course  Procedures (including critical care time) Labs Review Labs Reviewed - No data to display  Imaging Review No results found. I have personally reviewed and evaluated these images and lab results as part of my medical  decision-making.   EKG Interpretation None      MDM   Final diagnoses:  None   cervical radiculopathy  Patient presents to the ER for evaluation of multiple problems. Patient was interviewed via interpreter. Patient has been expressing pain in her neck for the last 3 months. She has seen multiple doctors for this. It appears that she has been complaining about symptoms of anxiety and depression in addition to her neck pain. She has been given a prescription for Prozac, Paxil and Lexapro. She has also been prescribed Vistaril, clonazepam and Ambien. It's not clear if she is taking all these medications together. I have reduced her medications to Lexapro, Ambien as needed at night and her clonazepam as needed. Other medications were stopped.  His have diffuse neck pain with increased pain with range of motion. She does not have any weakness in her upper extremities. Strength and sensation are normal. This is consistent with cervical radiculopathy. Will treat with steroids, anti-inflammatory, Ultram.    Gilda Creasehristopher J Aneyah Lortz, MD 08/06/15 [phone]

## 2015-08-11 ENCOUNTER — Emergency Department (HOSPITAL_COMMUNITY): Payer: Self-pay

## 2015-08-11 ENCOUNTER — Emergency Department (HOSPITAL_COMMUNITY): Payer: No Typology Code available for payment source

## 2015-08-11 ENCOUNTER — Emergency Department (HOSPITAL_COMMUNITY)
Admission: EM | Admit: 2015-08-11 | Discharge: 2015-08-11 | Disposition: A | Discharge status: Home / Self Care | Payer: No Typology Code available for payment source | Attending: Emergency Medicine | Admitting: Emergency Medicine

## 2015-08-11 ENCOUNTER — Encounter (HOSPITAL_COMMUNITY): Payer: Self-pay | Admitting: *Deleted

## 2015-08-11 DIAGNOSIS — Z791 Long term (current) use of non-steroidal anti-inflammatories (NSAID): Secondary | ICD-10-CM | POA: Insufficient documentation

## 2015-08-11 DIAGNOSIS — Z79899 Other long term (current) drug therapy: Secondary | ICD-10-CM | POA: Insufficient documentation

## 2015-08-11 DIAGNOSIS — Z7952 Long term (current) use of systemic steroids: Secondary | ICD-10-CM | POA: Insufficient documentation

## 2015-08-11 DIAGNOSIS — G479 Sleep disorder, unspecified: Secondary | ICD-10-CM | POA: Insufficient documentation

## 2015-08-11 DIAGNOSIS — R202 Paresthesia of skin: Secondary | ICD-10-CM | POA: Insufficient documentation

## 2015-08-11 DIAGNOSIS — M542 Cervicalgia: Secondary | ICD-10-CM | POA: Insufficient documentation

## 2015-08-11 LAB — CBC WITH DIFFERENTIAL/PLATELET
Basophils Absolute: 0 K/uL (ref 0.0–0.1)
Basophils Relative: 0 %
Eosinophils Absolute: 0.1 K/uL (ref 0.0–0.7)
Eosinophils Relative: 1 %
HCT: 42.6 % (ref 36.0–46.0)
Hemoglobin: 14.2 g/dL (ref 12.0–15.0)
Lymphocytes Relative: 28 %
Lymphs Abs: 4.7 K/uL — ABNORMAL HIGH (ref 0.7–4.0)
MCH: 30.8 pg (ref 26.0–34.0)
MCHC: 33.3 g/dL (ref 30.0–36.0)
MCV: 92.4 fL (ref 78.0–100.0)
Monocytes Absolute: 1.7 K/uL — ABNORMAL HIGH (ref 0.1–1.0)
Monocytes Relative: 10 %
Neutro Abs: 10.4 K/uL — ABNORMAL HIGH (ref 1.7–7.7)
Neutrophils Relative %: 61 %
Platelets: 267 K/uL (ref 150–400)
RBC: 4.61 MIL/uL (ref 3.87–5.11)
RDW: 13.4 % (ref 11.5–15.5)
WBC: 16.9 K/uL — ABNORMAL HIGH (ref 4.0–10.5)

## 2015-08-11 LAB — COMPREHENSIVE METABOLIC PANEL WITH GFR
ALT: 18 U/L (ref 14–54)
AST: 17 U/L (ref 15–41)
Albumin: 3.6 g/dL (ref 3.5–5.0)
Alkaline Phosphatase: 51 U/L (ref 38–126)
Anion gap: 9 (ref 5–15)
BUN: 10 mg/dL (ref 6–20)
CO2: 24 mmol/L (ref 22–32)
Calcium: 9.2 mg/dL (ref 8.9–10.3)
Chloride: 103 mmol/L (ref 101–111)
Creatinine, Ser: 0.7 mg/dL (ref 0.44–1.00)
GFR calc Af Amer: >60 mL/min (ref >60)
GFR calc non Af Amer: >60 mL/min (ref >60)
Glucose, Bld: 115 mg/dL — ABNORMAL HIGH (ref 65–99)
Potassium: 3.8 mmol/L (ref 3.5–5.1)
Sodium: 136 mmol/L (ref 135–145)
Total Bilirubin: 0.5 mg/dL (ref 0.3–1.2)
Total Protein: 6.8 g/dL (ref 6.5–8.1)

## 2015-08-11 NOTE — ED Provider Notes (Signed)
CSN: [id number]     Arrival date & time 08/11/15  1626 History   First MD Initiated Contact with Patient 08/11/15 1733     Chief Complaint  Patient presents with  . Neck Pain     Patient is a 39 y.o. female presenting with neck pain. The history is provided by the patient. A language interpreter was used.  Neck Pain Jennifer Noble is a 39 y.o. female who presents to the Emergency Department complaining of neck pain.  She reports two weeks of neck pain with a sensation of ants going up and down her left arm.  She has tried the prescribed medications from her last ED visit (12/18) without significant improvement in her sxs. She reports feeling poorly in general and inability to sleep lately.  She denies fevers, vomiting, abdominal pain, chest pain, sob, weakness.  Sxs are moderate, constant, worsening.     Past Medical History  Diagnosis Date  . Hemorrhoid   . Dysuria    History reviewed. No pertinent past surgical history. Family History  Problem Relation Age of Onset  . Diabetes Mother   . Heart disease     Social History  Substance Use Topics  . Smoking status: Never Smoker   . Smokeless tobacco: Never Used  . Alcohol Use: No   OB History    Gravida Para Term Preterm AB TAB SAB Ectopic Multiple Living   0 0 0 0 0 0 3     Review of Systems  Musculoskeletal: Positive for neck pain.  All other systems reviewed and are negative.     Allergies  Review of patient's allergies indicates no known allergies.  Home Medications   Prior to Admission medications   Medication Sig Start Date End Date Taking? Authorizing Provider  escitalopram (LEXAPRO) 10 MG tablet Take 10 mg by mouth at bedtime.   Yes Historical Provider, MD  ibuprofen (ADVIL,MOTRIN) 800 MG tablet Take 1 tablet (800 mg total) by mouth 3 (three) times daily. 08/06/15  Yes Gilda Crease, MD  predniSONE (DELTASONE) 20 MG tablet 3 tabs po daily x 3 days, then 2 tabs x 3 days, then 1.5 tabs  x 3 days, then 1 tab x 3 days, then 0.5 tabs x 3 days 08/06/15  Yes Gilda Crease, MD  traMADol (ULTRAM) 50 MG tablet Take 1 tablet (50 mg total) by mouth every 6 (six) hours as needed. Patient taking differently: Take 50 mg by mouth every 6 (six) hours as needed for moderate pain or severe pain.  08/06/15  Yes Gilda Crease, MD  zolpidem (AMBIEN) 10 MG tablet Take 10 mg by mouth at bedtime.   Yes Historical Provider, MD   BP 136/87 mmHg  Pulse 89  Temp(Src) 97.6 F (36.4 C) (Oral)  Resp 18  SpO2 100% Physical Exam  Constitutional: She is oriented to person, place, and time. She appears well-developed and well-nourished.  HENT:  Head: Normocephalic and atraumatic.  Cardiovascular: Normal rate and regular rhythm.   No murmur heard. Pulmonary/Chest: Effort normal and breath sounds normal. No respiratory distress.  Abdominal: Soft. There is no tenderness. There is no rebound and no guarding.  Musculoskeletal: She exhibits no edema or tenderness.  Minimal c spine tenderness to palpation.  2+ radial pulses.   Neurological: She is alert and oriented to person, place, and time.  5/5 strength in BUE.  Symmetric grip strength bilaterally. Sensation to light touch intact throughout BUE but feels different.  Skin: Skin  is warm and dry.  Psychiatric: She has a normal mood and affect. Her behavior is normal.  Nursing note and vitals reviewed.   ED Course  Procedures (including critical care time) Labs Review Labs Reviewed  COMPREHENSIVE METABOLIC PANEL - Abnormal; Notable for the following:    Glucose, Bld 115 (*)    All other components within normal limits  CBC WITH DIFFERENTIAL/PLATELET - Abnormal; Notable for the following:    WBC 16.9 (*)    Neutro Abs 10.4 (*)    Lymphs Abs 4.7 (*)    Monocytes Absolute 1.7 (*)    All other components within normal limits    Imaging Review Ct Head Wo Contrast  08/11/2015  CLINICAL DATA:  POSTERIOR HEAD AND NECK PAIN X 2 WEEKS  FATIGUE AND RIGHT SHOULDER NUMBNESS SEEN 12-18 FOR SIMILAR COMPLAINT PMH: EXAM: CT HEAD WITHOUT CONTRAST TECHNIQUE: Contiguous axial images were obtained from the base of the skull through the vertex without intravenous contrast. COMPARISON:  MR 09/11/2005 FINDINGS: There is no evidence of acute intracranial hemorrhage, brain edema, mass lesion, acute infarction, mass effect, or midline shift. Acute infarct may be inapparent on noncontrast CT. No other intra-axial abnormalities are seen, and the ventricles and sulci are within normal limits in size and symmetry. No abnormal extra-axial fluid collections or masses are identified. No significant calvarial abnormality. IMPRESSION: 1. Negative for bleed or other acute intracranial process. Electronically Signed   By: Corlis Leak  Hassell M.D.   On: 08/11/2015 19:37   Mr Cervical Spine Wo Contrast  08/11/2015  CLINICAL DATA:  Neck pain with left face and left arm weakness. EXAM: MRI CERVICAL SPINE WITHOUT CONTRAST TECHNIQUE: Multiplanar, multisequence MR imaging of the cervical spine was performed. No intravenous contrast was administered. COMPARISON:  None. FINDINGS: Straightening of the cervical lordosis. Normal alignment. Negative for fracture or mass. Negative for cord compression. Spinal cord signal has normal signal. C2-3:  Negative C3-4:  Negative C4-5: Small central disc protrusion. Negative for spinal or foraminal stenosis C5-6: Small central disc protrusion without spinal or foraminal stenosis C6-7: Small central disc protrusion without spinal or foraminal stenosis C7-T1: Negative IMPRESSION: Mild cervical degenerative change without significant neural impingement. Electronically Signed   By: Marlan Palauharles  Clark M.D.   On: 08/11/2015 20:47   I have personally reviewed and evaluated these images and lab results as part of my medical decision-making.   EKG Interpretation None      MDM   Final diagnoses:  Paresthesia  Neck pain    Pt here for paresthesias and  neck pain.  No focal objective neuro deficits on exam.  No evidence of herniated disc on MRI.  Pt is requesting refills on medications that she has more than two weeks of pills remaining.  Discussed that we cannot fill her chronic medications - she can follow up with her family doctor for refills.  Discussed discontinuing her prednisone since she is not receiving any benefit.  CBC with leukocytosis - likely secondary to prednisone use.  Discussed outpatient follow up, return precautions.     Jennifer FossaElizabeth Murial Beam, MD 08/12/15 2340

## 2015-08-11 NOTE — Discharge Instructions (Signed)
Dolor sin causa aparente (Pain Without a Known Cause) QU ES EL DOLOR SIN CAUSA APARENTE? El dolor puede presentarse en cualquier parte del cuerpo y puede ser de leve a grave. En algunos casos, las causas del dolor se desconocen. Algunos tipos de dolor que pueden ocurrir sin causa aparente incluyen los siguientes:   Dolor de Turkmenistancabeza.  Dolor de espalda.  Dolor abdominal.  Dolor en el cuello. CMO SE DIAGNOSTICA EL DOLOR SIN CAUSA APARENTE?  Su mdico tratar de Production assistant, radiodeterminar la causa del dolor. Para esto, realizar lo siguiente:  Examen fsico.  Historia clnica.  Anlisis de Claryvillesangre.  Anlisis de [place].  Radiografas. Si no se descubre ninguna causa, el mdico puede diagnosticarle "dolor sin causa aparente".  HAY TRATAMIENTO PARA EL DOLOR SIN CAUSA APARENTE?  El tratamiento depende del tipo de dolor que tenga. El mdico puede recetar medicamentos para ayudar a Primary school teachercalmar el dolor.  QU PUEDO HACER EN MI CASA PARA CONTROLAR EL DOLOR?   Tome los medicamentos solamente como se lo haya indicado el mdico.  Interrumpa las actividades que le causen dolor. En los momentos que sienta dolor intenso, haga reposo en cama.  Trate de reducir el estrs realizando actividades, como yoga o meditacin. Hable con su mdico para que le d otras recomendaciones sobre cmo reducir el estrs con la actividad fsica.  Haga ejercicio regularmente si el mdico lo autoriza.  Siga una dieta saludable que incluya frutas y verduras. Esto puede Curatormejorar el dolor. Hable con el mdico si tiene alguna pregunta Liz Claibornesobre la dieta. QU PASA SI EL DOLOR NO MEJORA?  Si tiene Scientific laboratory technicianmucho dolor y no se Administrator, sportsencuentran los motivos de Insurance risk surveyordicho dolor o este [place]empeora, es importante que realice un seguimiento con su mdico. Puede ser necesario repetir las pruebas y Education officer, environmentalrealizar estudios ms profundos para hallar la posible causa.    Esta informacin no tiene Theme park managercomo fin reemplazar el consejo del mdico. Asegrese de hacerle al mdico cualquier  pregunta que tenga.   Document Released: 05/15/2005 Document Revised: 08/26/2014 Elsevier Interactive Patient Education 2016 [employer]  Parestesia (Paresthesia) La parestesia es una sensacin de ardor o picazn fuera de lo normal que, en general, aparece en las manos, los brazos, las piernas o los pies. Sin embargo, puede Comptrollermanifestarse en cualquier parte del cuerpo. Por lo general, no es dolorosa. La sensacin puede describirse con los siguientes trminos:  Hormigueo o adormecimiento.  Pinchazos.  Algo que trepa por la piel.  Vibracin.  Extremidades que se duermen.  Picazn. La mayora de las personas sienten parestesia temporal en algn momento de la vida. La parestesia puede producirse al respirar muy rpido (hiperventilacin). Tambin puede aparecer sin causa aparente. Es comn que haya parestesia al hacer presin sobre un nervio, pero la sensacin desaparece rpidamente al quitar la presin. Sin embargo, para Engineering geologistalgunas personas la parestesia es un problema de larga duracin (crnico) causado por un trastorno preexistente. Si sigue teniendo parestesia, es posible que necesite evaluacin mdica adicional. INSTRUCCIONES PARA EL CUIDADO EN EL HOGAR Controle su afeccin para ver si hay cambios. Las siguientes medidas pueden servir para Psychologist, educationalaliviar cualquier molestia que sienta:  Evite el consumo de alcohol.  Pruebe con masajes o acupuntura como ayuda para Eastman Kodakaliviar los sntomas.  Concurra a todas las visitas de control como se lo haya indicado el mdico. Esto es importante. SOLICITE ATENCIN MDICA SI:  Contina teniendo episodios de parestesia.  La sensacin de ardor o picazn empeora al caminar.  Tiene dolor, calambres o mareos.  Le aparece una erupcin cutnea. SOLICITE  ATENCIN MDICA DE INMEDIATO SI:  Se siente dbil.  Tiene dificultad para caminar o moverse.  Tiene problemas con el habla, la comprensin o la visin.  Se siente confundido.  No puede controlar la  vejiga o los intestinos.  Siente adormecimiento despus de una lesin.  Se desmaya.   Esta informacin no tiene Theme park manager el consejo del mdico. Asegrese de hacerle al mdico cualquier pregunta que tenga.   Document Released: 11/21/2008 Document Revised: 12/20/2014 Elsevier Interactive Patient Education [employer]

## 2015-08-11 NOTE — ED Notes (Signed)
Dr. Rees MD at bedside. 

## 2015-08-11 NOTE — ED Notes (Signed)
Used translator phones: Pt has multiple complaints. Was seen here on 12/18 for same, treated with prescriptions for neck pain with no relief. Reports that her "head feels foggy" and numbness sensation to right shoulder and arm, generalized fatigue. No acute distress noted at triage.

## 2015-08-14 ENCOUNTER — Encounter (HOSPITAL_COMMUNITY): Payer: Self-pay | Admitting: *Deleted

## 2015-08-14 ENCOUNTER — Emergency Department (HOSPITAL_COMMUNITY)
Admission: EM | Admit: 2015-08-14 | Discharge: 2015-08-14 | Disposition: A | Discharge status: Home / Self Care | Payer: No Typology Code available for payment source | Attending: Emergency Medicine | Admitting: Emergency Medicine

## 2015-08-14 DIAGNOSIS — G4700 Insomnia, unspecified: Secondary | ICD-10-CM | POA: Insufficient documentation

## 2015-08-14 DIAGNOSIS — Z8719 Personal history of other diseases of the digestive system: Secondary | ICD-10-CM | POA: Insufficient documentation

## 2015-08-14 DIAGNOSIS — R531 Weakness: Secondary | ICD-10-CM | POA: Insufficient documentation

## 2015-08-14 DIAGNOSIS — R202 Paresthesia of skin: Secondary | ICD-10-CM | POA: Insufficient documentation

## 2015-08-14 DIAGNOSIS — Z3202 Encounter for pregnancy test, result negative: Secondary | ICD-10-CM | POA: Insufficient documentation

## 2015-08-14 DIAGNOSIS — Z79899 Other long term (current) drug therapy: Secondary | ICD-10-CM | POA: Insufficient documentation

## 2015-08-14 DIAGNOSIS — Z791 Long term (current) use of non-steroidal anti-inflammatories (NSAID): Secondary | ICD-10-CM | POA: Insufficient documentation

## 2015-08-14 LAB — CBC WITH DIFFERENTIAL/PLATELET
Basophils Absolute: 0 K/uL (ref 0.0–0.1)
Basophils Relative: 0 %
Eosinophils Absolute: 0.1 K/uL (ref 0.0–0.7)
Eosinophils Relative: 1 %
HCT: 44 % (ref 36.0–46.0)
Hemoglobin: 14.3 g/dL (ref 12.0–15.0)
Lymphocytes Relative: 31 %
Lymphs Abs: 3.5 K/uL (ref 0.7–4.0)
MCH: 29.9 pg (ref 26.0–34.0)
MCHC: 32.5 g/dL (ref 30.0–36.0)
MCV: 92.1 fL (ref 78.0–100.0)
Monocytes Absolute: 1 K/uL (ref 0.1–1.0)
Monocytes Relative: 9 %
Neutro Abs: 6.6 K/uL (ref 1.7–7.7)
Neutrophils Relative %: 59 %
Platelets: 292 K/uL (ref 150–400)
RBC: 4.78 MIL/uL (ref 3.87–5.11)
RDW: 13.6 % (ref 11.5–15.5)
WBC: 11.1 K/uL — ABNORMAL HIGH (ref 4.0–10.5)

## 2015-08-14 LAB — I-STAT CHEM 8, ED
BUN: 7 mg/dL (ref 6–20)
Calcium, Ion: 1.18 mmol/L (ref 1.12–1.23)
Chloride: 102 mmol/L (ref 101–111)
Creatinine, Ser: 0.4 mg/dL — ABNORMAL LOW (ref 0.44–1.00)
Glucose, Bld: 97 mg/dL (ref 65–99)
HCT: 44 % (ref 36.0–46.0)
Hemoglobin: 15 g/dL (ref 12.0–15.0)
Potassium: 3.4 mmol/L — ABNORMAL LOW (ref 3.5–5.1)
Sodium: 140 mmol/L (ref 135–145)
TCO2: 25 mmol/L (ref 0–100)

## 2015-08-14 LAB — POC URINE PREG, ED: Preg Test, Ur: NEGATIVE

## 2015-08-14 LAB — CK: Total CK: 28 U/L — ABNORMAL LOW (ref 38–234)

## 2015-08-14 MED ORDER — POTASSIUM CHLORIDE CRYS ER 20 MEQ PO TBCR
40.0000 meq | EXTENDED_RELEASE_TABLET | Freq: Once | ORAL | Status: AC
  Administered 2015-08-14: 40 meq via ORAL
  Filled 2015-08-14: qty 2

## 2015-08-14 NOTE — ED Notes (Signed)
Pt reports insomnia and fatigue x2 weeks, pt also has "neck tingling" as well - pt has been seen at this facility x2 for neck pain and insomnia. Pt was prescribed zolidem and has had no relief from her insomnia.

## 2015-08-14 NOTE — Discharge Instructions (Signed)
Insomnio (Insomnia) Discontinue Zolpidem (Ambien). Instead try Benadryl 50 mg at bedtime as needed for sleep. Ask your primary care physician on [facility] whether you should continue the antidepressant. Follow-up with your primary care physician if you continue to have insomnia. Return if concerned for any reason. El insomnio es un trastorno del sueo que causa dificultades para conciliar el sueo o para [place]. Puede producir cansancio (fatiga), falta de energa, dificultad para concentrarse, cambios en el estado de nimo y mal rendimiento escolar o laboral.  Hay tres formas diferentes de clasificar el insomnio:   Dificultad para conciliar el sueo.  Dificultad para mantener el sueo.  Despertar muy precoz por la maana. Cualquier tipo de insomnio puede ser a Air cabin crew (crnico) o a Product manager (agudo). Ambos son frecuentes. Generalmente, el insomnio a corto plazo dura tres meses o menos tiempo. El crnico ocurre al menos tres veces por semana durante ms de tres meses. CAUSAS  El insomnio puede deberse a otra afeccin, situacin o sustancia, por ejemplo:  Ansiedad.  Algunos medicamentos.  Enfermedad por reflujo gastroesofgico (ERGE) u otras enfermedades gastrointestinales.  Asma y otras enfermedades respiratorias.  Sndrome de las piernas inquietas, apnea del sueo u otros trastornos del sueo.  Dolor crnico.  Menopausia, que puede incluir calores repentinos.  Ictus.  Consumo excesivo de alcohol, tabaco u drogas ilegales.  Depresin.  Cafena.  Trastornos neurolgicos, como enfermedad de Alzheimer.  Hiperactividad tiroidea (hipertiroidismo). Es posible que la causa del insomnio no se conozca. FACTORES DE RIESGO Los factores de riesgo de tener insomnio incluyen lo siguiente:  El sexo. La mujeres se ven ms afectadas que los hombres.  La edad. El insomnio es ms frecuente a medida que una persona envejece.  El estrs. Esto puede incluir su vida  profesional o personal.  Los ingresos. El insomnio es ms frecuente en las personas cuyos ingresos son ms bajos.  La falta de actividad fsica.  Los horarios de trabajo irregulares o los turnos nocturnos.  Los viajes a lugares de diferentes zonas horarias. SIGNOS Y SNTOMAS Si tiene insomnio, el sntoma principal es la dificultad para conciliar el sueo o mantenerlo. Esto puede derivar en otros sntomas, por ejemplo:  Sentirse fatigado.  Ponerse nervioso por [employer] irse a dormir.  No sentirse descansado por la maana.  Tener dificultad para concentrarse.  Sentirse irritable, ansioso o deprimido. TRATAMIENTO  El tratamiento para el insomnio depende de la causa. Si se debe a una enfermedad preexistente, el tratamiento se centrar en el abordaje de la enfermedad. El tratamiento tambin puede incluir lo siguiente:   Medicamentos que lo ayuden a dormir.  Asesoramiento psicolgico o terapia.  Cambios en el estilo de vida. INSTRUCCIONES PARA EL CUIDADO EN EL HOGAR   Tome los medicamentos solamente como se lo haya indicado el mdico.  Establezca horarios habituales para dormir y Chiropodist. No tome siestas.  Lleve un registro del sueo ya que podra ser de utilidad para que usted y a su mdico puedan determinar qu podra estar causndole insomnio. Incluya lo siguiente:  Cundo duerme.  Cundo se despierta durante la noche.  Qu tan bien duerme.  Qu tan relajado se siente al da siguiente.  Cualquier efecto secundario de los medicamentos que toma.  Lo que usted come y bebe.  Convierta a su habitacin en un lugar cmodo donde sea fcil conciliar el sueo:  Coloque persianas o cortinas especiales oscuras que impidan la entrada de la luz del exterior.  Para bloquear los ruidos, use un aparato que reproduce sonidos  ambientales o relajantes de fondo.  Mantenga baja la temperatura.  Haga ejercicio regularmente como se lo haya indicado el mdico. No haga ejercicio  justo antes de la hora de [place]acostarse.  Utilice tcnicas de relajacin para controlar el estrs. Pdale al mdico que le sugiera algunas tcnicas que sean adecuadas para usted. Estos pueden incluir lo siguiente:  Ejercicios de respiracin.  Rutinas para aliviar la tensin muscular.  Visualizacin de escenas apacibles.  Disminucin del consumo de alcohol, bebidas con cafena y cigarrillos, especialmente cerca de la hora de Kentacostarse, ya que pueden perturbarle el sueo.  No coma en exceso ni consuma comidas picantes justo antes de la hora de [place]bronacostarse. Esto puede causarle molestias digestivas y dificultades para dormir.  Limite el uso de pantallas antes de la hora de Martacostarse. Esto incluye lo siguiente:  Mirar televisin.  Usar el telfono inteligente, la tableta y la computadora.  Siga una rutina. Esto puede ayudarlo a conciliar el sueo ms rpidamente. Intente hacer una actividad tranquila, cepillarse los dientes e irse a la cama a la misma hora todas las noches.  Levntese de la cama si sigue despierto despus de 15minutos de haber intentado dormirse. Mantenga bajas las luces, pero intente leer o hacer una actividad tranquila. Cuando tenga sueo, regrese a Pharmacist, hospitalla cama.  Conduzca con cuidado. No conduzca si est muy somnoliento.  Concurra a todas las visitas de control, segn le indique su mdico. Esto es importante. SOLICITE ATENCIN MDICA SI:   Est cansado durante el da o tiene dificultades en su rutina diaria debido a la somnolencia.  Sigue teniendo problemas para dormir o Teacher, English as a foreign languageestos empeoran. SOLICITE ATENCIN MDICA DE INMEDIATO SI:   Tiene pensamientos serios acerca de lastimarse a usted mismo o daar a Engineer, maintenance (IT)otra persona.   Esta informacin no tiene Theme park managercomo fin reemplazar el consejo del mdico. Asegrese de hacerle al mdico cualquier pregunta que tenga.   Document Released: 08/05/2005 Document Revised: 08/26/2014 Elsevier Interactive Patient Education [employer]2016 Elsevier Inc.

## 2015-08-14 NOTE — ED Provider Notes (Signed)
CSN: [id number]     Arrival date & time 08/14/15  1154 History   First MD Initiated Contact with Patient 08/14/15 1422     Chief Complaint  Patient presents with  . Insomnia  . Fatigue   speaks no [place] and history is obtained using professional interpreter using [employer] which line.   (Consider location/radiation/quality/duration/timing/severity/associated sxs/prior Treatment) HPI Planes of generalized weakness, and insomnia as well as diffuse burning in both arms both legs and neck tingling for the proximal 2 weeks. She also feels "pressure in her head. She's been seen by a primary care physician in [place] prescribe an antidepressant and Ambien, which she's taken without relief. No other associated symptoms. Patient was seen in the emergency department 08/11/2015 had CT of brain and MRi of cervical spine which showed no acute abnormality. Last normal menstrual period 2 weeks ago. No urinary symptoms no abdominal pain. No other associated symptoms Past Medical History  Diagnosis Date  . Hemorrhoid   . Dysuria    Past Surgical History  Procedure Laterality Date  . Hemorrhoid surgery     Family History  Problem Relation Age of Onset  . Diabetes Mother   . Heart disease     Social History  Substance Use Topics  . Smoking status: Never Smoker   . Smokeless tobacco: Never Used  . Alcohol Use: No   OB History    Gravida Para Term Preterm AB TAB SAB Ectopic Multiple Living   0 0 0 0 0 0 3     Review of Systems  Constitutional: Positive for fatigue.       Insomnia  HENT: Negative.   Respiratory: Negative.   Cardiovascular: Negative.   Gastrointestinal: Negative.   Musculoskeletal: Positive for myalgias.       Burning sensation in arms and legs  Skin: Negative.   Neurological: Negative.   Psychiatric/Behavioral: Negative.   All other systems reviewed and are negative.     Allergies  Review of patient's allergies indicates no known allergies.  Home  Medications   Prior to Admission medications   Medication Sig Start Date End Date Taking? Authorizing Provider  escitalopram (LEXAPRO) 10 MG tablet Take 10 mg by mouth at bedtime.    Historical Provider, MD  ibuprofen (ADVIL,MOTRIN) 800 MG tablet Take 1 tablet (800 mg total) by mouth 3 (three) times daily. 08/06/15   Gilda Crease, MD  predniSONE (DELTASONE) 20 MG tablet 3 tabs po daily x 3 days, then 2 tabs x 3 days, then 1.5 tabs x 3 days, then 1 tab x 3 days, then 0.5 tabs x 3 days 08/06/15   Gilda Crease, MD  traMADol (ULTRAM) 50 MG tablet Take 1 tablet (50 mg total) by mouth every 6 (six) hours as needed. Patient taking differently: Take 50 mg by mouth every 6 (six) hours as needed for moderate pain or severe pain.  08/06/15   Gilda Crease, MD  zolpidem (AMBIEN) 10 MG tablet Take 10 mg by mouth at bedtime.    Historical Provider, MD   LMP 07/31/2015 (Within Weeks) Physical Exam  Constitutional: She is oriented to person, place, and time. She appears well-developed and well-nourished.  HENT:  Head: Normocephalic and atraumatic.  Eyes: Conjunctivae are normal. Pupils are equal, round, and reactive to light.  Fundi benign  Neck: Neck supple. No tracheal deviation present. No thyromegaly present.  Cardiovascular: Normal rate and regular rhythm.   No murmur heard. Pulmonary/Chest: Effort normal and breath sounds  normal.  Abdominal: Soft. Bowel sounds are normal. She exhibits no distension. There is no tenderness.  Musculoskeletal: Normal range of motion. She exhibits no edema or tenderness.  Neurological: She is alert and oriented to person, place, and time. She displays normal reflexes. No cranial nerve deficit. Coordination normal.  Gait normal motor strength 5 over 5 overall to DTRs symmetric bilaterally and knee jerk and ankle jerk and biceps was toes downgoing bilaterally  Skin: Skin is warm and dry. No rash noted.  Psychiatric: She has a normal mood and  affect.  Nursing note and vitals reviewed.   ED Course  Procedures (including critical care time) Labs Review Labs Reviewed - No data to display  Imaging Review No results found. I have personally reviewed and evaluated these images and lab results as part of my medical decision-making.   EKG Interpretation None      4:40 PM patient appears comfortable. Results for orders placed or performed during the hospital encounter of 08/14/15  CK  Result Value Ref Range   Total CK 28 (L) 38 - 234 U/L  CBC with Differential/Platelet  Result Value Ref Range   WBC 11.1 (H) 4.0 - 10.5 K/uL   RBC 4.78 3.87 - 5.11 MIL/uL   Hemoglobin 14.3 12.0 - 15.0 g/dL   HCT 40.9 81.1 - 91.4 %   MCV 92.1 78.0 - 100.0 fL   MCH 29.9 26.0 - 34.0 pg   MCHC 32.5 30.0 - 36.0 g/dL   RDW 78.2 95.6 - 21.3 %   Platelets 292 150 - 400 K/uL   Neutrophils Relative % 59 %   Neutro Abs 6.6 1.7 - 7.7 K/uL   Lymphocytes Relative 31 %   Lymphs Abs 3.5 0.7 - 4.0 K/uL   Monocytes Relative 9 %   Monocytes Absolute 1.0 0.1 - 1.0 K/uL   Eosinophils Relative 1 %   Eosinophils Absolute 0.1 0.0 - 0.7 K/uL   Basophils Relative 0 %   Basophils Absolute 0.0 0.0 - 0.1 K/uL  I-stat chem 8, ed  Result Value Ref Range   Sodium 140 135 - 145 mmol/L   Potassium 3.4 (L) 3.5 - 5.1 mmol/L   Chloride 102 101 - 111 mmol/L   BUN 7 6 - 20 mg/dL   Creatinine, Ser 0.86 (L) 0.44 - 1.00 mg/dL   Glucose, Bld 97 65 - 99 mg/dL   Calcium, Ion 5.78 4.69 - 1.23 mmol/L   TCO2 25 0 - 100 mmol/L   Hemoglobin 15.0 12.0 - 15.0 g/dL   HCT 62.9 52.8 - 41.3 %  POC urine preg, ED (not at [facility])  Result Value Ref Range   Preg Test, Ur NEGATIVE NEGATIVE   Ct Head Wo Contrast  08/11/2015  CLINICAL DATA:  POSTERIOR HEAD AND NECK PAIN X 2 WEEKS FATIGUE AND RIGHT SHOULDER NUMBNESS SEEN 12-18 FOR SIMILAR COMPLAINT PMH: EXAM: CT HEAD WITHOUT CONTRAST TECHNIQUE: Contiguous axial images were obtained from the base of the skull through the vertex without  intravenous contrast. COMPARISON:  MR 09/11/2005 FINDINGS: There is no evidence of acute intracranial hemorrhage, brain edema, mass lesion, acute infarction, mass effect, or midline shift. Acute infarct may be inapparent on noncontrast CT. No other intra-axial abnormalities are seen, and the ventricles and sulci are within normal limits in size and symmetry. No abnormal extra-axial fluid collections or masses are identified. No significant calvarial abnormality. IMPRESSION: 1. Negative for bleed or other acute intracranial process. Electronically Signed   By: Ronald Pippins.D.  On: 08/11/2015 19:37   Mr Cervical Spine Wo Contrast  08/11/2015  CLINICAL DATA:  Neck pain with left face and left arm weakness. EXAM: MRI CERVICAL SPINE WITHOUT CONTRAST TECHNIQUE: Multiplanar, multisequence MR imaging of the cervical spine was performed. No intravenous contrast was administered. COMPARISON:  None. FINDINGS: Straightening of the cervical lordosis. Normal alignment. Negative for fracture or mass. Negative for cord compression. Spinal cord signal has normal signal. C2-3:  Negative C3-4:  Negative C4-5: Small central disc protrusion. Negative for spinal or foraminal stenosis C5-6: Small central disc protrusion without spinal or foraminal stenosis C6-7: Small central disc protrusion without spinal or foraminal stenosis C7-T1: Negative IMPRESSION: Mild cervical degenerative change without significant neural impingement. Electronically Signed   By: Marlan Palauharles  Clark M.D.   On: 08/11/2015 20:47    MDM  Plan discontinue Ambien, Benadryl 50 mg at bedtime as needed for sleep. No signs of rhabdomyolysis. Instructions given to patient verbally using medical interpreter. Final diagnoses:  None   diagnosis #1 insomnia #2 myalgias #3 hypokalemia      Doug SouSam Romanda Turrubiates, MD 08/14/15 1649

## 2015-08-17 ENCOUNTER — Encounter: Payer: Self-pay | Admitting: Neurology

## 2015-08-17 ENCOUNTER — Ambulatory Visit (INDEPENDENT_AMBULATORY_CARE_PROVIDER_SITE_OTHER): Payer: Self-pay | Admitting: Neurology

## 2015-08-17 VITALS — BP 98/73 | HR 105 | Resp 20 | Ht 64.0 in | Wt 136.0 lb

## 2015-08-17 DIAGNOSIS — F329 Major depressive disorder, single episode, unspecified: Secondary | ICD-10-CM

## 2015-08-17 DIAGNOSIS — F32A Depression, unspecified: Secondary | ICD-10-CM | POA: Insufficient documentation

## 2015-08-17 DIAGNOSIS — G4700 Insomnia, unspecified: Secondary | ICD-10-CM | POA: Insufficient documentation

## 2015-08-17 MED ORDER — VENLAFAXINE HCL ER 37.5 MG PO CP24: ORAL_CAPSULE | ORAL | Status: DC

## 2015-08-17 MED ORDER — CLONAZEPAM 1 MG PO TABS: 1.0000 mg | ORAL_TABLET | Freq: Two times a day (BID) | ORAL | Status: DC | PRN

## 2015-08-17 NOTE — Patient Instructions (Signed)
[facility]  Address: [address], [place], [place] [address] Phone: [phone]    [facility]  [address]  [phone]
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
## 2015-08-27 ENCOUNTER — Encounter (HOSPITAL_COMMUNITY): Payer: Self-pay | Admitting: *Deleted

## 2015-08-27 ENCOUNTER — Emergency Department (HOSPITAL_COMMUNITY)
Admission: EM | Admit: 2015-08-27 | Discharge: 2015-08-27 | Disposition: A | Discharge status: Home / Self Care | Payer: No Typology Code available for payment source | Attending: Emergency Medicine | Admitting: Emergency Medicine

## 2015-08-27 ENCOUNTER — Emergency Department (HOSPITAL_COMMUNITY): Payer: Self-pay

## 2015-08-27 ENCOUNTER — Emergency Department (HOSPITAL_COMMUNITY): Payer: No Typology Code available for payment source

## 2015-08-27 DIAGNOSIS — S82141A Displaced bicondylar fracture of right tibia, initial encounter for closed fracture: Secondary | ICD-10-CM

## 2015-08-27 DIAGNOSIS — M25569 Pain in unspecified knee: Secondary | ICD-10-CM

## 2015-08-27 DIAGNOSIS — M25561 Pain in right knee: Secondary | ICD-10-CM

## 2015-08-27 DIAGNOSIS — Z791 Long term (current) use of non-steroidal anti-inflammatories (NSAID): Secondary | ICD-10-CM | POA: Insufficient documentation

## 2015-08-27 DIAGNOSIS — Y92000 Kitchen of unspecified non-institutional (private) residence as  the place of occurrence of the external cause: Secondary | ICD-10-CM | POA: Insufficient documentation

## 2015-08-27 DIAGNOSIS — S82291A Other fracture of shaft of right tibia, initial encounter for closed fracture: Secondary | ICD-10-CM | POA: Insufficient documentation

## 2015-08-27 DIAGNOSIS — Z79899 Other long term (current) drug therapy: Secondary | ICD-10-CM | POA: Insufficient documentation

## 2015-08-27 DIAGNOSIS — Y9389 Activity, other specified: Secondary | ICD-10-CM | POA: Insufficient documentation

## 2015-08-27 DIAGNOSIS — Y999 Unspecified external cause status: Secondary | ICD-10-CM | POA: Insufficient documentation

## 2015-08-27 DIAGNOSIS — Z8719 Personal history of other diseases of the digestive system: Secondary | ICD-10-CM | POA: Insufficient documentation

## 2015-08-27 DIAGNOSIS — X509XXA Other and unspecified overexertion or strenuous movements or postures, initial encounter: Secondary | ICD-10-CM | POA: Insufficient documentation

## 2015-08-27 LAB — I-STAT BETA HCG BLOOD, ED (MC, WL, AP ONLY): I-stat hCG, quantitative: <5 m[IU]/mL (ref <5)

## 2015-08-27 MED ORDER — OXYCODONE-ACETAMINOPHEN 5-325 MG PO TABS: 1.0000 | ORAL_TABLET | Freq: Four times a day (QID) | ORAL | Status: DC | PRN

## 2015-08-27 MED ORDER — OXYCODONE-ACETAMINOPHEN 5-325 MG PO TABS
1.0000 | ORAL_TABLET | Freq: Once | ORAL | Status: AC
  Administered 2015-08-27: 1 via ORAL
  Filled 2015-08-27: qty 1

## 2015-08-27 NOTE — Discharge Instructions (Signed)
Please read and follow all provided instructions.  Your diagnoses today include:  1. Tibial plateau fracture, right, closed, initial encounter   2. Right knee pain   3. Knee pain    Tests performed today include:  Vital signs. See below for your results today.   Medications prescribed:   Percocet  You have been prescribed a narcotic medication on an "as needed" basis. Take only as prescribed. Do not drive, operate any machinery or make any important decisions while taking this medication as it is sedating. It may cause constipation take over the counter stool softeners or add fiber to your diet to treat this (Metamucil, Psyllium Fiber, Colace, Miralax) Further refills will need to be obtained from your primary care doctor and will not be prescribed through the Emergency Department. You will test positive on most drug tests while taking this medciation.   Home care instructions:  Follow any educational materials contained in this packet.  Follow-up instructions: Cita maana a las 8:30 am con Dr. Eulah PontMurphy un cirujano ortopdico.   Return instructions:   Please return to the Emergency Department if you do not get better, if you get worse, or new symptoms OR  - Fever (temperature greater than 101.5F)  - Bleeding that does not stop with holding pressure to the area    -Severe pain (please note that you may be more sore the day after your accident)  - Chest Pain  - Difficulty breathing  - Severe nausea or vomiting  - Inability to tolerate food and liquids  - Passing out  - Skin becoming red around your wounds  - Change in mental status (confusion or lethargy)  - New numbness or weakness     Please return if you have any other emergent concerns.  Additional Information:  Your vital signs today were: BP 156/98 mmHg   Pulse 69   Temp(Src) 97.3 F (36.3 C) (Oral)   Resp 18   SpO2 100%   LMP 07/31/2015 (Within Weeks) If your blood pressure (BP) was elevated above 135/85 this visit,  please have this repeated by your doctor within one month. ---------------

## 2015-08-27 NOTE — ED Notes (Signed)
PT reports that she stepped into the floor heating grate. Pt reports pain to Rt knee. Pain is located below knee .

## 2015-08-27 NOTE — ED Notes (Signed)
Declined W/C at D/C and was escorted to lobby by RN. 

## 2015-08-27 NOTE — ED Provider Notes (Signed)
CSN: [id number]     Arrival date & time 08/27/15  1110 History  By signing my name below, I, Jennifer Noble, attest that this documentation has been prepared under the direction and in the presence of Audry Pili, PA-C. Electronically Signed: Elon Noble ED Scribe. 08/27/2015. 11:37 AM.  Chief Complaint  Patient presents with  . Knee Pain   The history is provided by the patient. No language interpreter was used.   HPI Comments: Jennifer Noble [employer] Jennifer Noble is a 40 y.o. female who presents to the Emergency Department complaining of constant, moderate right knee and shin pain onset 8:00 pm last night after stepping in an open, 1.5 foot deep heating duct on the floor of her kitchen and twisting her knee; unrelieved by ibuprofen.  She states she was unable to bear weight this morning after waking due to pain.  Patient does not use anti-coagulants.  She denies fever, ankle pain, other complaints.  NKA.    A language interpreter was used to communicate with the patient.   Past Medical History  Diagnosis Date  . Hemorrhoid   . Dysuria    Past Surgical History  Procedure Laterality Date  . Hemorrhoid surgery     Family History  Problem Relation Age of Onset  . Diabetes Mother   . Heart disease     Social History  Substance Use Topics  . Smoking status: Never Smoker   . Smokeless tobacco: Never Used  . Alcohol Use: No   OB History    Gravida Para Term Preterm AB TAB SAB Ectopic Multiple Living   3 3 3  0 0 0 0 0 0 3     Review of Systems   10 Systems reviewed and all are negative for acute change except as noted in the HPI.  Allergies  Review of patient's allergies indicates no known allergies.  Home Medications   Prior to Admission medications   Medication Sig Start Date End Date Taking? Authorizing Provider  clonazePAM (KLONOPIN) 1 MG tablet Take 1 tablet (1 mg total) by mouth 2 (two) times daily as needed for anxiety. 08/17/15   Levert Feinstein, MD  escitalopram (LEXAPRO) 10 MG  tablet Take 10 mg by mouth at bedtime.    Historical Provider, MD  ibuprofen (ADVIL,MOTRIN) 800 MG tablet Take 1 tablet (800 mg total) by mouth 3 (three) times daily. 08/06/15   Gilda Crease, MD  predniSONE (DELTASONE) 20 MG tablet 3 tabs po daily x 3 days, then 2 tabs x 3 days, then 1.5 tabs x 3 days, then 1 tab x 3 days, then 0.5 tabs x 3 days Patient not taking: Reported on 08/14/2015 08/06/15   Gilda Crease, MD  traMADol (ULTRAM) 50 MG tablet Take 1 tablet (50 mg total) by mouth every 6 (six) hours as needed. Patient not taking: Reported on 08/14/2015 08/06/15   Gilda Crease, MD  venlafaxine XR (EFFEXOR XR) 37.5 MG 24 hr capsule One po qday xone week, then 2 tabs po qday 08/17/15   Levert Feinstein, MD  zolpidem (AMBIEN) 10 MG tablet Take 10 mg by mouth at bedtime.    Historical Provider, MD   BP 109/58 mmHg  Pulse 80  Temp(Src) 97.4 F (36.3 C) (Oral)  Resp 18  SpO2 100%  LMP 07/31/2015 (Within Weeks)   Physical Exam  Constitutional: She is oriented to person, place, and time. She appears well-developed and well-nourished. No distress.  HENT:  Head: Normocephalic and atraumatic.  Eyes: Conjunctivae and EOM are normal.  Neck: Neck supple. No tracheal deviation present.  Cardiovascular: Normal rate and regular rhythm.   Pulmonary/Chest: Effort normal and breath sounds normal. No respiratory distress.  Musculoskeletal:       Right knee: She exhibits decreased range of motion and swelling. She exhibits no deformity, no laceration, no LCL laxity, no bony tenderness and no MCL laxity. Tenderness found. Medial joint line and lateral joint line tenderness noted. No MCL, no LCL and no patellar tendon tenderness noted.       Left knee: Normal.       Right ankle: Normal.       Left ankle: Normal.  Swelling suprapatellar, medially, and laterally. ACL/PCL/MCL/LCL appear intact upon examination. Pain with flexion/extension of knee. Ankle normal ROM w/ no pain  Neurological:  She is alert and oriented to person, place, and time.  Skin: Skin is warm and dry.  Psychiatric: She has a normal mood and affect. Her behavior is normal.  Nursing note and vitals reviewed.  ED Course  Procedures (including critical care time)  DIAGNOSTIC STUDIES: Oxygen Saturation is 100% on RA, normal by my interpretation.    COORDINATION OF CARE:  11:43 AM discussed plan to obtain imaging of knee. Patient acknowledges and agrees with plan.    Labs Review Labs Reviewed  I-STAT BETA HCG BLOOD, ED ([facility], [facility], [facility] ONLY)    Imaging Review Ct Knee Right Wo Contrast  08/27/2015  CLINICAL DATA:  Patient fell at home yesterday with right knee pain and swelling EXAM: CT OF THE RIGHT KNEE WITHOUT CONTRAST TECHNIQUE: Multidetector CT imaging of the RIGHT knee was performed according to the standard protocol. Multiplanar CT image reconstructions were also generated. COMPARISON:  08/27/2015 radiographs FINDINGS: There is a moderate joint effusion with fluid fluid level consistent with lipoma hemarthrosis. There is a fracture involving the central to lateral tibial plateau. Fracture fragments are minimally displaced. Fracture line extends into the lateral tibial metaphysis. No fractures identified involving the proximal fibula. There is no evidence of fracture involving the femur or patella. IMPRESSION: Moderate lipoma hemarthrosis.  Tibial plateau fracture. Electronically Signed   By: Esperanza Heiraymond  Rubner M.D.   On: 08/27/2015 15:46   Dg Knee Complete 4 Views Right  08/27/2015  CLINICAL DATA:  Right knee pain onset last night after injury. EXAM: RIGHT KNEE - COMPLETE 4+ VIEW COMPARISON:  None. FINDINGS: There is a slightly displaced/impacted tibial plateau fracture, involving the central and lateral aspects of the tibial plateau with extension to the lateral tibial metaphysis. No discrete fracture seen within the medial tibial plateau. Associated joint effusion noted, most prominently within the suprapatellar  bursa. Distal femur and proximal fibula appear intact and well aligned. The patella appears intact and well aligned. IMPRESSION: 1. Slightly displaced/impacted tibial plateau fracture, involving the central and lateral aspects of the tibial plateau with extension to the lateral tibial metaphysis. 2.  Associated joint effusion. Electronically Signed   By: Bary RichardStan  Maynard M.D.   On: 08/27/2015 13:22   I have personally reviewed and evaluated these images and lab results as part of my medical decision-making.   EKG Interpretation None     MDM  I have reviewed relevant imaging studies. I have reviewed the relevant previous healthcare records. I obtained HPI from historian. Pt discussed with supervising physician  ED Course: Xray R Knee 1:43 PM Consult to Orthopedic Surgery  Assessment: [age]y F presenting with L knee pain. Based on HPI, I believe the patient might have a meniscal tear due to forced rotation of  the knee while ankle was in a fixed position. ACL/PCL/MCL/LCL remain intact on examination. Patellar tendon palpable with intact ROM of knee. No compartment syndrome noted on exam.  Xray revealed a slightly displaced tibial plateau fracture involving the central and lateral aspects with extension into the lateral tibial metaphysis. CT ordered as requested by Ortho Surg  Will have pt follow up outpatient with Orthopedics Renaye Rakers) for appointment at 8:30am tomorrow. Will DC with knee immobilizer, crutches, non weight bearing and pain medication.  Patient is in no acute distress. Vital Signs are stable. Patient is able to ambulate w/ crutches. Patient able to tolerate PO.   Disposition/Plan:  DC Home. Additional Verbal discharge instructions given and discussed with patient.  Pt Instructed to f/u with Orthopedics Renaye Rakers) at 8:30am tomorrow  Supervising Physician Arby Barrette, MD   Final diagnoses:  Right knee pain  Tibial plateau fracture, right, closed, initial encounter    I  personally performed the services described in this documentation, which was scribed in my presence. The recorded information has been reviewed and is accurate.   Audry Pili, PA-C 08/27/15 1755  Arby Barrette, MD 08/29/15 [phone]

## 2015-08-27 NOTE — ED Notes (Signed)
SEE PA Assessment 

## 2015-10-23 ENCOUNTER — Ambulatory Visit: Payer: No Typology Code available for payment source | Attending: Sports Medicine | Admitting: Physical Therapy

## 2015-10-23 DIAGNOSIS — R29898 Other symptoms and signs involving the musculoskeletal system: Secondary | ICD-10-CM | POA: Insufficient documentation

## 2015-10-23 DIAGNOSIS — R6889 Other general symptoms and signs: Secondary | ICD-10-CM | POA: Insufficient documentation

## 2015-10-23 DIAGNOSIS — R269 Unspecified abnormalities of gait and mobility: Secondary | ICD-10-CM | POA: Insufficient documentation

## 2015-10-23 DIAGNOSIS — R600 Localized edema: Secondary | ICD-10-CM | POA: Insufficient documentation

## 2015-10-23 DIAGNOSIS — M25561 Pain in right knee: Secondary | ICD-10-CM | POA: Insufficient documentation

## 2015-10-23 NOTE — Therapy (Signed)
[facility] [address] [place], [place], [address] Phone: [phone]   Fax:  [phone]  Physical Therapy Evaluation  Patient Details  Name: Jennifer Noble MRN: [id number] Date of Birth: [birth year] Referring Provider: Margarita Rana MD  Encounter Date: 10/23/2015      PT End of Session - 10/23/15 1722    Visit Number 1   Number of Visits 16   Date for PT Re-Evaluation 12/18/15   Activity Tolerance Patient tolerated treatment well   Behavior During Therapy [facility] for tasks assessed/performed      Past Medical History  Diagnosis Date  . Hemorrhoid   . Dysuria     Past Surgical History  Procedure Laterality Date  . Hemorrhoid surgery      There were no vitals filed for this visit.  Visit Diagnosis:  Right knee pain  Localized edema  Right leg weakness  Abnormality of gait  Activity intolerance      Subjective Assessment - 10/23/15 1641    Subjective pt is a 40 y.o F with CC of R knee pain started 8 weeks ago due to cleaning air conditioner outlets and she stepped into it and hurt the knee about 8 weeks ago. She reports pain in the knee with swelling in the ankle that seems to be getting better. she reports she has been able to get better with putting weight on it but uses a RW.  She reports having more pain at times in the ankle.    Limitations Standing;Walking;House hold activities;Lifting   How long can you sit comfortably? 1 hour   How long can you stand comfortably? 30 min with RW, without RW 10 min   How long can you walk comfortably? 30 min with RW, without RW 10 min   Diagnostic tests x-ray    Patient Stated Goals To be able ot bend the knee, walking without RW,    Currently in Pain? Yes   Pain Score 3    Pain Location Knee   Pain Orientation Right   Pain Descriptors / Indicators Squeezing;Tightness  pressure   Pain Type Chronic pain   Pain Radiating Towards To the Right ankle with  swelling   Pain Onset More than a month ago   Pain Frequency Constant   Aggravating Factors  bending the knee,   Pain Relieving Factors rest   Multiple Pain Sites Yes   Pain Score 3   Pain Location Ankle   Pain Orientation Right   Pain Descriptors / Indicators Tightness;Squeezing  pressure   Pain Type Chronic pain   Pain Onset More than a month ago   Pain Frequency Intermittent   Aggravating Factors  prolonged standing   Pain Relieving Factors resting            [facility] PT Assessment - 10/23/15 1604    Assessment   Medical Diagnosis R Tibial plateau fracture   Referring Provider timothy Murphy MD   Onset Date/Surgical Date --  8 weeks ago   Hand Dominance Right   Next MD Visit 11/10/2015   Prior Therapy no   Precautions   Precautions None   Restrictions   Weight Bearing Restrictions No   Balance Screen   Has the patient fallen in the past 6 months Yes   How many times? 1   Has the patient had a decrease in activity level because of a fear of falling?  No   Is the patient reluctant to leave their home because of a  fear of falling?  No   Home Environment   Living Environment Private residence   Living Arrangements Spouse/significant other;Children   Available Help at Discharge Available 24 hours/day;Available PRN/intermittently   Type of Home Mobile home   Home Access Ramped entrance   Home Layout One level   Home Equipment Walker - 2 wheels   Prior Function   Level of Independence Independent;Independent with basic ADLs   Vocation Full time employment   Leisure hanging with family   Cognition   Overall Cognitive Status Within Functional Limits for tasks assessed   Observation/Other Assessments   Focus on Therapeutic Outcomes (FOTO)  55% limited  predicted 36% limited   Observation/Other Assessments-Edema    Edema Circumferential   Circumferential Edema   Circumferential - Right 44cm 10 cm above the knee, 42.8 @ the knee, 38 cm 10 cm below the knee   ROM /  Strength   AROM / PROM / Strength AROM;PROM;Strength   AROM   AROM Assessment Site Knee   Right/Left Knee Right;Left   Right Knee Extension -4   Right Knee Flexion 55   Left Knee Extension 0   Left Knee Flexion 129   PROM   PROM Assessment Site Knee   Right/Left Knee Right;Left   Right Knee Extension 0   Right Knee Flexion 64   Strength   Strength Assessment Site Hip;Knee   Right/Left Hip Right;Left   Right/Left Knee Right;Left   Right Knee Flexion 3+/5  due to limited ROm   Right Knee Extension 3+/5  due to limited ROM   Left Knee Flexion 5/5   Left Knee Extension 5/5   Palpation   Palpation comment tenderness located peripatellar and at the tibial plateuas   Ambulation/Gait   Ambulation/Gait Yes   Assistive device Rolling walker   Gait Pattern Step-to pattern;Decreased stride length;Decreased stance time - right;Decreased step length - left;Antalgic;Trunk flexed;Narrow base of support   Ambulation Surface Level                             PT Short Term Goals - 10/23/15 1735    PT SHORT TERM GOAL #1   Title pt will be I with inital HEP (11/23/2015)   Time 4   Period Weeks   Status New   PT SHORT TERM GOAL #2   Title pt will be able to verbalize and demonstrate techniques to reduce R knee inflammation and swelling via RICE and HEP (11/23/2015)   Time 4   Period Weeks   Status New   PT SHORT TERM GOAL #3   Title pt will improve her R knee flexion to >/= 80 degrees to assist with funcitonal progression (11/23/2015)   Time 4   Period Weeks   Status New   PT SHORT TERM GOAL #4   Title pt will improve her R knee strength to >/=4-/5 to promote safety with weight bearing activities  (11/23/2015)   Time 4   Period Weeks   Status New   PT SHORT TERM GOAL #5   Title pt will demonstate decreased R knee edema by >/= 1 cm to promote R knee AROM with </= 4/10 pain (11/23/2015)   Time 4   Period Weeks   Status New           PT Long Term Goals - 10/23/15  1737    PT LONG TERM GOAL #1   Title pt will be I with all  HEP as of last visit (12/18/2015)   Time 8   Period Weeks   Status New   PT LONG TERM GOAL #2   Title she will improver her R knee flexion to >/= 120 degrees to assist with normal and energy efficient gait pattern ( 12/18/2015)   Time 8   Period Weeks   Status New   PT LONG TERM GOAL #3   Title she will improve her R knee strenght to >/= 4/5 with </= 2/10 pain to promote safety with prolonged walking standing activities (12/18/2015)   Time 8   Period Weeks   Status New   PT LONG TERM GOAL #4   Title she will be able to walk/stand >/=45 min and ascending/ descend >/=10 step with LRAD and </= 2/10 pain to promote community ambulation for ADLS (12/18/2015)   Time 8   Period Weeks   Status New   PT LONG TERM GOAL #5   Title she will improver her FOTO score to >/= 64 at discharge to demonstrate improvment in function (12/18/2015)   Time 8   Period Weeks   Status New               Plan - 10/23/15 1728    Clinical Impression Statement Jennifer Noble presents OPPT as moderate complexity evaluation with a R tibial plateau fracture that occurred from falling through a heat registry about 8 weeks ago. She demonstrates limited Knee AROM secondary to pain and swelling, MMT revealed weakness based off her lack or ROM. She demonstrates signficant pitting edema around the knee with pain peripatellar. She reports having pain in the R ankle which due to time constraints was unable to assess today, but plan to look at next visit.  She currenlty ambulates with RW demonstrates antalgic gait pattern with limited weight bearing on the RLE. She would benefit from physical therapy to decresae her edema, improve her knee AROM, and strength, to utilze a proper gait pattern safely and overall maximize her function by addressing the impairments listed.    Pt will benefit from skilled therapeutic intervention in order to improve on the following deficits  Pain;Improper body mechanics;Postural dysfunction;Decreased activity tolerance;Decreased endurance;Decreased balance;Hypomobility;Decreased strength;Increased edema;Decreased mobility;Decreased range of motion;Difficulty walking   PT Frequency 2x / week   PT Duration 8 weeks   PT Treatment/Interventions ADLs/Self Care Home Management;Cryotherapy;Electrical Stimulation;Iontophoresis 4mg /ml Dexamethasone;Moist Heat;Therapeutic exercise;Manual techniques;Vasopneumatic Device;Passive range of motion;Taping;Dry needling;Ultrasound;Patient/family education;Therapeutic activities;Gait training   PT Next Visit Plan assess and review HEP, manual knee mobility and swelling, bike, Vaso, review ankle ROM/ strength   PT Home Exercise Plan standing weight shifts, LAQ, quad sets, heel slides with strap, icing with elevation.    Consulted and Agree with Plan of Care Patient         Problem List Patient Active Problem List   Diagnosis Date Noted  . Depression 08/17/2015  . Insomnia 08/17/2015  . Elevated lipids 04/08/2013  . Family history of early CAD 04/08/2013  . Hemorrhoid   . Dysuria    Jennifer Noble PT, DPT, LAT, ATC  10/23/2015  5:45 PM     [facility] [address] [place], [place], [address] Phone: [phone]   Fax:  [phone]  Name: Jennifer Noble MRN: [id number] Date of Birth: 08/25/1975

## 2015-10-26 ENCOUNTER — Ambulatory Visit: Payer: No Typology Code available for payment source | Admitting: Physical Therapy

## 2015-10-26 DIAGNOSIS — R6889 Other general symptoms and signs: Secondary | ICD-10-CM

## 2015-10-26 DIAGNOSIS — R269 Unspecified abnormalities of gait and mobility: Secondary | ICD-10-CM

## 2015-10-26 DIAGNOSIS — M25561 Pain in right knee: Secondary | ICD-10-CM

## 2015-10-26 DIAGNOSIS — R29898 Other symptoms and signs involving the musculoskeletal system: Secondary | ICD-10-CM

## 2015-10-26 DIAGNOSIS — R600 Localized edema: Secondary | ICD-10-CM

## 2015-10-26 NOTE — Therapy (Signed)
[facility] [address] [place], [place], [address] Phone: [phone]   Fax:  [phone]  Physical Therapy Treatment  Patient Details  Name: Jennifer Noble MRN: [id number] Date of Birth: 1976/07/29 Referring Provider: Margarita Rana MD  Encounter Date: 10/26/2015      PT End of Session - 10/26/15 1803    Visit Number 3   Number of Visits 16   Date for PT Re-Evaluation 12/18/15   Activity Tolerance Patient tolerated treatment well   Behavior During Therapy [facility] for tasks assessed/performed      Past Medical History  Diagnosis Date  . Hemorrhoid   . Dysuria     Past Surgical History  Procedure Laterality Date  . Hemorrhoid surgery      There were no vitals filed for this visit.  Visit Diagnosis:  Right knee pain  Localized edema  Right leg weakness  Abnormality of gait  Activity intolerance      Subjective Assessment - 10/26/15 1513    Subjective 3/10 pain RT knee and ankle.   Patient is accompained by: Interpreter;Family member                         [facility] Adult PT Treatment/Exercise - 10/26/15 0001    Ambulation/Gait   Gait Comments trial cane in clinic patient was walking without the walker at home a little.    gait with brace on   Self-Care   Self-Care RICE;Retrograde Massage  husband learned how, able to demonstrate understsnding   Retrograde Massage education   Knee/Hip Exercises: Aerobic   Recumbent Bike for ROM 3 minutes with brace on.     Knee/Hip Exercises: Seated   Long Arc Quad 10 reps   Heel Slides Limitations leg flexion 10 X foot off floor, AROM   Knee/Hip Exercises: Supine   Quad Sets 10 reps;2 sets   Short Arc Quad Sets 10 reps;1 set  No weight   Heel Slides 10 reps;1 set  strap   Knee Flexion 10 reps  with strap   Cryotherapy   Number Minutes Cryotherapy 10 Minutes   Cryotherapy Location Knee   Type of Cryotherapy --  cold pack   Manual  Therapy   Manual therapy comments retrograde  tissue softened                PT Education - 10/26/15 1803    Education provided Yes   Education Details retrograde,  RICE   Person(s) Educated Patient;Spouse   Comprehension Verbalized understanding;Returned demonstration          PT Short Term Goals - 10/23/15 1735    PT SHORT TERM GOAL #1   Title pt will be I with inital HEP (11/23/2015)   Time 4   Period Weeks   Status New   PT SHORT TERM GOAL #2   Title pt will be able to verbalize and demonstrate techniques to reduce R knee inflammation and swelling via RICE and HEP (11/23/2015)   Time 4   Period Weeks   Status New   PT SHORT TERM GOAL #3   Title pt will improve her R knee flexion to >/= 80 degrees to assist with funcitonal progression (11/23/2015)   Time 4   Period Weeks   Status New   PT SHORT TERM GOAL #4   Title pt will improve her R knee strength to >/=4-/5 to promote safety with weight bearing activities  (11/23/2015)   Time 4  Period Weeks   Status New   PT SHORT TERM GOAL #5   Title pt will demonstate decreased R knee edema by >/= 1 cm to promote R knee AROM with </= 4/10 pain (11/23/2015)   Time 4   Period Weeks   Status New           PT Long Term Goals - 10/23/15 1737    PT LONG TERM GOAL #1   Title pt will be I with all HEP as of last visit (12/18/2015)   Time 8   Period Weeks   Status New   PT LONG TERM GOAL #2   Title she will improver her R knee flexion to >/= 120 degrees to assist with normal and energy efficient gait pattern ( 12/18/2015)   Time 8   Period Weeks   Status New   PT LONG TERM GOAL #3   Title she will improve her R knee strenght to >/= 4/5 with </= 2/10 pain to promote safety with prolonged walking standing activities (12/18/2015)   Time 8   Period Weeks   Status New   PT LONG TERM GOAL #4   Title she will be able to walk/stand >/=45 min and ascending/ descend >/=10 step with LRAD and </= 2/10 pain to promote community  ambulation for ADLS (12/18/2015)   Time 8   Period Weeks   Status New   PT LONG TERM GOAL #5   Title she will improver her FOTO score to >/= 64 at discharge to demonstrate improvment in function (12/18/2015)   Time 8   Period Weeks   Status New               Plan - 10/26/15 1804    Clinical Impression Statement Beginning exercises and edema/pain work today.  Patient did not realize she had a fracture.  She is interested in walking with a cane. Extra time required with interperter.   PT Next Visit Plan assess and review HEP, manual knee mobility and swelling, bike, Vaso,review ankle ROM/ strength .  update home exercises   PT Home Exercise Plan standing weight shifts, LAQ, quad sets, heel slides with strap, icing with elevation. continue   Consulted and Agree with Plan of Care Patient;Family member/caregiver   Family Member Consulted husband        Problem List Patient Active Problem List   Diagnosis Date Noted  . Depression 08/17/2015  . Insomnia 08/17/2015  . Elevated lipids 04/08/2013  . Family history of early CAD 04/08/2013  . Hemorrhoid   . Dysuria     Kennan Detter 10/26/2015, 6:12 PM  [facility] [address] [place], [place], [address] Phone: [phone]   Fax:  [phone]  Name: Jennifer Noble MRN: [id number] Date of Birth: 04/22/1976    Liz BeachKaren Daune [employer], PTA 10/26/2015 6:12 PM Phone: [phone] Fax: [phone]

## 2015-10-30 ENCOUNTER — Ambulatory Visit: Payer: No Typology Code available for payment source | Admitting: Physical Therapy

## 2015-10-30 DIAGNOSIS — R269 Unspecified abnormalities of gait and mobility: Secondary | ICD-10-CM

## 2015-10-30 DIAGNOSIS — R29898 Other symptoms and signs involving the musculoskeletal system: Secondary | ICD-10-CM

## 2015-10-30 DIAGNOSIS — M25561 Pain in right knee: Secondary | ICD-10-CM

## 2015-10-30 DIAGNOSIS — R6889 Other general symptoms and signs: Secondary | ICD-10-CM

## 2015-10-30 DIAGNOSIS — R600 Localized edema: Secondary | ICD-10-CM

## 2015-10-30 NOTE — Therapy (Addendum)
[place]ennifer Noble, [place], [address] Phone: [phone]   Fax:  [phone]  Physical Therapy Treatment  Patient Details  Name: Jennifer Noble MRN: [id number] Date of Birth: 1976/06/24 Referring Provider: Edmonia Lynch MD  Encounter Date: 10/30/2015      PT End of Session - 10/30/15 1614    Visit Number 4   Number of Visits 16   Date for PT Re-Evaluation 12/18/15   Activity Tolerance Patient tolerated treatment well   Behavior During Therapy [facility] for tasks assessed/performed      Past Medical History  Diagnosis Date  . Hemorrhoid   . Dysuria     Past Surgical History  Procedure Laterality Date  . Hemorrhoid surgery      There were no vitals filed for this visit.  Visit Diagnosis:  Right knee pain  Localized edema  Right leg weakness  Abnormality of gait  Activity intolerance      Subjective Assessment - 10/30/15 1507    Subjective Knee is a little better the problem is my foot.     Patient is accompained by: Family member;Interpreter   Currently in Pain? Yes   Pain Score 3    Pain Location Knee   Pain Orientation Right   Pain Descriptors / Indicators Squeezing;Tightness   Pain Frequency Intermittent   Aggravating Factors  bending,  walking it hurts   Pain Relieving Factors rest sitting down,  icce.   Multiple Pain Sites Yes   Pain Score 5   Pain Location Ankle   Pain Orientation Right;Anterior   Aggravating Factors  weight bearing.     Pain Relieving Factors rests ,  ice            [facility] PT Assessment - 10/31/15 0001    Palpation   Palpation comment tenderness located in the sinus tarsi of the R ankle of the anterior talofibular ligament.    Special Tests    Special Tests Ankle/Foot Special Tests   Ankle/Foot Special Tests  Anterior Drawer Test;Talar Tilt Test   Anterior Drawer Test   Findings Positive   Side  Right   Talar Tilt Test    Findings Negative    Side  Right                             PT Education - 10/30/15 1613    Education provided Yes   Education Details heel slides   Person(s) Educated Patient;Spouse   Methods Explanation;Demonstration;Tactile cues;Verbal cues;Handout   Comprehension Verbalized understanding;Returned demonstration          PT Short Term Goals - 10/30/15 1619    PT SHORT TERM GOAL #1   Title pt will be I with inital HEP (11/23/2015)   Time 4   Period Weeks   Status On-going   PT SHORT TERM GOAL #2   Title pt will be able to verbalize and demonstrate techniques to reduce R knee inflammation and swelling via RICE and HEP (11/23/2015)   Baseline Husband asked if she still needed to use ICE and elevation, (Currently doing at home) He has been assisting a little with retrograde work thigh/knee.    Time 4   Period Weeks   Status Partially Met   PT SHORT TERM GOAL #3   Title pt will improve her R knee flexion to >/= 80 degrees to assist with funcitonal progression (11/23/2015)   Baseline 60   Time 4  Period Weeks   Status On-going   PT SHORT TERM GOAL #4   Title pt will improve her R knee strength to >/=4-/5 to promote safety with weight bearing activities  (11/23/2015)   Time 4   Period Weeks   Status Unable to assess   PT SHORT TERM GOAL #5   Title pt will demonstate decreased R knee edema by >/= 1 cm to promote R knee AROM with </= 4/10 pain (11/23/2015)   Time 4   Period Weeks   Status Unable to assess           PT Long Term Goals - 10/23/15 1737    PT LONG TERM GOAL #1   Title pt will be I with all HEP as of last visit (12/18/2015)   Time 8   Period Weeks   Status New   PT LONG TERM GOAL #2   Title she will improver her R knee flexion to >/= 120 degrees to assist with normal and energy efficient gait pattern ( 12/18/2015)   Time 8   Period Weeks   Status New   PT LONG TERM GOAL #3   Title she will improve her R knee strenght to >/= 4/5 with </= 2/10 pain to promote  safety with prolonged walking standing activities (12/18/2015)   Time 8   Period Weeks   Status New   PT LONG TERM GOAL #4   Title she will be able to walk/stand >/=45 min and ascending/ descend >/=10 step with LRAD and </= 2/10 pain to promote community ambulation for ADLS (01/19/8465)   Time 8   Period Weeks   Status New   PT LONG TERM GOAL #5   Title she will improver her FOTO score to >/= 64 at discharge to demonstrate improvment in function (12/18/2015)   Time 8   Period Weeks   Status New        Upon further assessment of the R ankle pt demonstrates tendnerness of the R anterior talofibular ligament with positive Anterior drawer testing special testing. She denies night pain and reports only having pain with walking. Visual inspection reveals Significant swelling around the  R talocrural joint. Cyriax findings are consistent with non-contractile structure involvement. Discussed with  patient That if she goes back to the doctor it would be beneficial to get imaging to rule out boney issues with the distal fibula.        Plan - 10/30/15 1614    Clinical Impression Statement PT Carlus Pavlov evaluated patient's foot RT. and reccomended soft tissue work and taping.  Progress toward patient's home exercise goal with the addition of heel slides.  She is still using a walker and knee brace and also uses a soft neopreen ankle support.  Extra time required todayduring session for communication and questions.    PT Next Visit Plan ROM knee and ankle.  A more comprehensive home exercise program for knee and ankle. Print out last exercise sheet.  Handdrawn one today was  inferior quality.    PT Home Exercise Plan knee/foot heel slides.    Consulted and Agree with Plan of Care Patient;Family member/caregiver   Family Member Consulted husband        Problem List Patient Active Problem List   Diagnosis Date Noted  . Depression 08/17/2015  . Insomnia 08/17/2015  . Elevated lipids 04/08/2013   . Family history of early CAD 04/08/2013  . Hemorrhoid   . Dysuria    Kristoffer Leamon PT, DPT, LAT,  ATC  10/31/2015  1:41 PM      [facility] [address] [place], [place], [address] Phone: [phone]   Fax:  [phone]  Name: Jennifer Noble MRN: [id number] Date of Birth: 1975-11-30    Melvenia Needles, PTA 10/31/2015 1:28 PM Phone: [phone] Fax: [phone]

## 2015-10-30 NOTE — Patient Instructions (Signed)
Chair Knee Flexion    Manteniendo los pies en el suelo, deslice el pie de la pierna operada hacia atrs, doblando la rodilla. Mantenga _1-3___ segundos. Repita _10___ veces. Haga _3___ sesiones por da. (3 X a day, 10 X 1-3 sets. ) http://gt2.exer.us/305   Copyright  VHI. All rights reserved.

## 2015-11-06 ENCOUNTER — Ambulatory Visit: Payer: No Typology Code available for payment source

## 2015-11-06 DIAGNOSIS — M25561 Pain in right knee: Secondary | ICD-10-CM

## 2015-11-06 DIAGNOSIS — R29898 Other symptoms and signs involving the musculoskeletal system: Secondary | ICD-10-CM

## 2015-11-06 DIAGNOSIS — R6889 Other general symptoms and signs: Secondary | ICD-10-CM

## 2015-11-06 DIAGNOSIS — R600 Localized edema: Secondary | ICD-10-CM

## 2015-11-06 DIAGNOSIS — R269 Unspecified abnormalities of gait and mobility: Secondary | ICD-10-CM

## 2015-11-06 NOTE — Therapy (Signed)
[place], [place], [address] Phone: [phone]   Fax:  [phone]  Physical Therapy Treatment  Patient Details  Name: Jennifer Noble MRN: [id number] Date of Birth: Jul 17, 1976 Referring Provider: Edmonia Lynch MD  Encounter Date: 11/06/2015      PT End of Session - 11/06/15 1723    Visit Number 4   Number of Visits 16   Date for PT Re-Evaluation 12/18/15   Activity Tolerance Patient tolerated treatment well   Behavior During Therapy [facility] for tasks assessed/performed      Past Medical History  Diagnosis Date  . Hemorrhoid   . Dysuria     Past Surgical History  Procedure Laterality Date  . Hemorrhoid surgery      There were no vitals filed for this visit.  Visit Diagnosis:  Right knee pain  Localized edema  Right leg weakness  Abnormality of gait  Activity intolerance      Subjective Assessment - 11/06/15 1645    Subjective Knee and ankle rate 5/10 today. Only painful when walking on it. X ray of ankle was negative. Pt saw MD today and was prescribed an anti-inflammatory. Pt reports she was a little better after last PT visit.    Currently in Pain? Yes   Pain Score 5    Pain Location Knee  and ankle    Pain Type Chronic pain   Pain Onset More than a month ago   Pain Score 5   Pain Location Ankle   Pain Orientation Right;Anterior   Pain Onset More than a month ago   Pain Frequency Intermittent                         [facility] Adult PT Treatment/Exercise - 11/06/15 0001    Exercises   Exercises Ankle   Knee/Hip Exercises: Seated   Long Arc Quad 2 sets;10 reps   Heel Slides 10 reps   Other Seated Knee/Hip Exercises Marches 10 x    Knee/Hip Exercises: Supine   Quad Sets 10 reps;2 sets   Short Arc Quad Sets 10 reps;1 set  No weight   Heel Slides 10 reps;1 set  strap   Straight Leg Raises 10 reps   Knee Flexion AAROM;10 reps  10 sec holds    Knee  Flexion Limitations 75   Cryotherapy   Number Minutes Cryotherapy 10 Minutes  elevated ankle and knee    Cryotherapy Location Knee;Ankle   Type of Cryotherapy Ice pack   Manual Therapy   Manual therapy comments retrograde soft tissue work RT leg including foot .  tissue softened with some tissue still stiff and congested.    Patellar mobs sup/ inf.    Ankle Exercises: Seated   Ankle Circles/Pumps 10 reps   Other Seated Ankle Exercises seated DF/PF, INV/ EV  10 x each                   PT Short Term Goals - 10/30/15 1619    PT SHORT TERM GOAL #1   Title pt will be I with inital HEP (11/23/2015)   Time 4   Period Weeks   Status On-going   PT SHORT TERM GOAL #2   Title pt will be able to verbalize and demonstrate techniques to reduce R knee inflammation and swelling via RICE and HEP (11/23/2015)   Baseline Husband asked if she still needed to use ICE and elevation, (Currently doing at home)  He has been assisting a little with retrograde work thigh/knee.    Time 4   Period Weeks   Status Partially Met   PT SHORT TERM GOAL #3   Title pt will improve her R knee flexion to >/= 80 degrees to assist with funcitonal progression (11/23/2015)   Baseline 60   Time 4   Period Weeks   Status On-going   PT SHORT TERM GOAL #4   Title pt will improve her R knee strength to >/=4-/5 to promote safety with weight bearing activities  (11/23/2015)   Time 4   Period Weeks   Status Unable to assess   PT SHORT TERM GOAL #5   Title pt will demonstate decreased R knee edema by >/= 1 cm to promote R knee AROM with </= 4/10 pain (11/23/2015)   Time 4   Period Weeks   Status Unable to assess           PT Long Term Goals - 10/23/15 1737    PT LONG TERM GOAL #1   Title pt will be I with all HEP as of last visit (12/18/2015)   Time 8   Period Weeks   Status New   PT LONG TERM GOAL #2   Title she will improver her R knee flexion to >/= 120 degrees to assist with normal and energy efficient gait  pattern ( 12/18/2015)   Time 8   Period Weeks   Status New   PT LONG TERM GOAL #3   Title she will improve her R knee strenght to >/= 4/5 with </= 2/10 pain to promote safety with prolonged walking standing activities (12/18/2015)   Time 8   Period Weeks   Status New   PT LONG TERM GOAL #4   Title she will be able to walk/stand >/=45 min and ascending/ descend >/=10 step with LRAD and </= 2/10 pain to promote community ambulation for ADLS (10/21/3612)   Time 8   Period Weeks   Status New   PT LONG TERM GOAL #5   Title she will improver her FOTO score to >/= 64 at discharge to demonstrate improvment in function (12/18/2015)   Time 8   Period Weeks   Status New               Plan - 11/06/15 1724    Clinical Impression Statement Peformed knee strengthening and ankle AROM ther ex. Extension lag noted with SLR. VCs and tactile to maintain knee extension during SLR ther ex. Knee flexion AAROM improved to 82 degrees following ther ex and patellar mobs.    PT Next Visit Plan ROM knee and ankle.  A more comprehensive home exercise program for knee and ankle. Print out last exercise sheet.  Handdrawn one today was  inferior quality. Add SLR to HEP    PT Home Exercise Plan knee/foot heel slides.    Consulted and Agree with Plan of Care Patient;Family member/caregiver   Family Member Consulted husband        Problem List Patient Active Problem List   Diagnosis Date Noted  . Depression 08/17/2015  . Insomnia 08/17/2015  . Elevated lipids 04/08/2013  . Family history of early CAD 04/08/2013  . Hemorrhoid   . Dysuria     Dollene Cleveland, PT 11/06/2015, 5:32 PM  [facility] [address] [place], [place], [address] Phone: [phone]   Fax:  [phone]  Name: Jennifer Noble MRN: [id number] Date of Birth: Nov 03, 1975

## 2015-11-08 ENCOUNTER — Ambulatory Visit: Payer: No Typology Code available for payment source | Admitting: Physical Therapy

## 2015-11-08 DIAGNOSIS — R269 Unspecified abnormalities of gait and mobility: Secondary | ICD-10-CM

## 2015-11-08 DIAGNOSIS — R29898 Other symptoms and signs involving the musculoskeletal system: Secondary | ICD-10-CM

## 2015-11-08 DIAGNOSIS — R6889 Other general symptoms and signs: Secondary | ICD-10-CM

## 2015-11-08 DIAGNOSIS — R600 Localized edema: Secondary | ICD-10-CM

## 2015-11-08 DIAGNOSIS — M25561 Pain in right knee: Secondary | ICD-10-CM

## 2015-11-08 NOTE — Therapy (Signed)
[place], [place], [address] Phone: [phone]   Fax:  [phone]  Physical Therapy Treatment  Patient Details  Name: Jennifer Noble MRN: [id number] Date of Birth: 02-Feb-1976 Referring Provider: Edmonia Lynch MD  Encounter Date: 11/08/2015      PT End of Session - 11/08/15 1729    Visit Number 5   Number of Visits 16   Date for PT Re-Evaluation 12/18/15   Activity Tolerance Patient tolerated treatment well   Behavior During Therapy [facility] for tasks assessed/performed      Past Medical History  Diagnosis Date  . Hemorrhoid   . Dysuria     Past Surgical History  Procedure Laterality Date  . Hemorrhoid surgery      There were no vitals filed for this visit.  Visit Diagnosis:  Right knee pain  Right leg weakness  Localized edema  Abnormality of gait  Activity intolerance      Subjective Assessment - 11/08/15 1625    Subjective Less pain,  more motion.  4/10 ankle and knee pain. Doing her home exercises            [facility] PT Assessment - 11/08/15 0001    PROM   Right Knee Flexion 90  strap.                     [facility] Adult PT Treatment/Exercise - 11/08/15 1635    Ambulation/Gait   Gait Comments on level no assist device   she looked quite good.  Cautioned her from walking in crowds   Knee/Hip Exercises: Standing   Forward Step Up Right;1 set;10 reps;Hand Hold: 2;Step Height: 6"   SLS with Vectors 3 way Lt leg sliding pillowcase 10 X,  intermittant contact guard.   Knee/Hip Exercises: Seated   Hamstring Curl 10 reps   Sit to Sand 5 reps  cues for equal weight shifts,  pulling noted   Knee/Hip Exercises: Supine   Quad Sets 10 reps   Straight Leg Raises 10 reps  cues   Manual Therapy   Manual therapy comments fibula A/P mobs,  Mid foot Mobs with movement  to assist DF foor RT   Ankle Exercises: Seated   Other Seated Ankle Exercises 3 way isomertice  10 X 5 each   Ankle Exercises: Standing   SLS 5 X  RT 6 seconds, LT 12 seconds,  shakey, both   Heel Raises 10 reps  both   Toe Raise 10 reps  both   Ankle Exercises: Stretches   Gastroc Stretch 3 reps;30 seconds  step                PT Education - 11/08/15 1721    Education provided Yes   Education Details standing ankle strength.   Person(s) Educated Patient   Methods Demonstration   Comprehension Verbalized understanding;Returned demonstration          PT Short Term Goals - 10/30/15 1619    PT SHORT TERM GOAL #1   Title pt will be I with inital HEP (11/23/2015)   Time 4   Period Weeks   Status On-going   PT SHORT TERM GOAL #2   Title pt will be able to verbalize and demonstrate techniques to reduce R knee inflammation and swelling via RICE and HEP (11/23/2015)   Baseline Husband asked if she still needed to use ICE and elevation, (Currently doing at home) He has been assisting a little with retrograde work  thigh/knee.    Time 4   Period Weeks   Status Partially Met   PT SHORT TERM GOAL #3   Title pt will improve her R knee flexion to >/= 80 degrees to assist with funcitonal progression (11/23/2015)   Baseline 60   Time 4   Period Weeks   Status On-going   PT SHORT TERM GOAL #4   Title pt will improve her R knee strength to >/=4-/5 to promote safety with weight bearing activities  (11/23/2015)   Time 4   Period Weeks   Status Unable to assess   PT SHORT TERM GOAL #5   Title pt will demonstate decreased R knee edema by >/= 1 cm to promote R knee AROM with </= 4/10 pain (11/23/2015)   Time 4   Period Weeks   Status Unable to assess           PT Long Term Goals - 10/23/15 1737    PT LONG TERM GOAL #1   Title pt will be I with all HEP as of last visit (12/18/2015)   Time 8   Period Weeks   Status New   PT LONG TERM GOAL #2   Title she will improver her R knee flexion to >/= 120 degrees to assist with normal and energy efficient gait pattern ( 12/18/2015)    Time 8   Period Weeks   Status New   PT LONG TERM GOAL #3   Title she will improve her R knee strenght to >/= 4/5 with </= 2/10 pain to promote safety with prolonged walking standing activities (12/18/2015)   Time 8   Period Weeks   Status New   PT LONG TERM GOAL #4   Title she will be able to walk/stand >/=45 min and ascending/ descend >/=10 step with LRAD and </= 2/10 pain to promote community ambulation for ADLS (03/27/1693)   Time 8   Period Weeks   Status New   PT LONG TERM GOAL #5   Title she will improver her FOTO score to >/= 64 at discharge to demonstrate improvment in function (12/18/2015)   Time 8   Period Weeks   Status New               Plan - 11/08/15 1729    Clinical Impression Statement Less pain post exercise.  Knee ROM gradually improving. Gait improving, Progressing with ankle ROM.     PT Next Visit Plan Add SLR to home exercise,  review standing ankle strengthening.  Balance .  Consider cane outside for practice.    PT Home Exercise Plan standing ankle   Consulted and Agree with Plan of Care Patient        Problem List Patient Active Problem List   Diagnosis Date Noted  . Depression 08/17/2015  . Insomnia 08/17/2015  . Elevated lipids 04/08/2013  . Family history of early CAD 04/08/2013  . Hemorrhoid   . Dysuria     Kathey Simer 11/08/2015, 5:35 PM  [facility] [address] [place], [place], [address] Phone: [phone]   Fax:  [phone]  Name: Arta Linnaea Ahn MRN: [id number] Date of Birth: August 31, 1975    Melvenia Needles, PTA 11/08/2015 5:35 PM Phone: [phone] Fax: [phone]

## 2015-11-08 NOTE — Patient Instructions (Addendum)
From exercise drawer:  Ankle strengthening standing.  Single leg stand 5 X as long as you can.  Heel lifts, both 10-30x Toe lifts 10-30 X 1 to 3 x a day    May need walker or cane in crowd, or hold to husband. For safety.

## 2015-11-14 ENCOUNTER — Ambulatory Visit: Payer: No Typology Code available for payment source | Admitting: Physical Therapy

## 2015-11-14 DIAGNOSIS — R600 Localized edema: Secondary | ICD-10-CM

## 2015-11-14 DIAGNOSIS — R6889 Other general symptoms and signs: Secondary | ICD-10-CM

## 2015-11-14 DIAGNOSIS — M25561 Pain in right knee: Secondary | ICD-10-CM

## 2015-11-14 DIAGNOSIS — R269 Unspecified abnormalities of gait and mobility: Secondary | ICD-10-CM

## 2015-11-14 DIAGNOSIS — R29898 Other symptoms and signs involving the musculoskeletal system: Secondary | ICD-10-CM

## 2015-11-14 NOTE — Patient Instructions (Signed)
Ankle: Calf Raise        De pie con apoyo, levante los talones, despus balancee hacia atrs y levante las puntas de los pies. Repita 10-20__ veces.  Copyright  VHI. All rights reserved.  _1__ sesiones por semana.  Copyright  VHI. All rights reserved.

## 2015-11-14 NOTE — Therapy (Signed)
[facility] Outpatient Rehabilitation [facility] [address] [place], [place], [address] Phone: [phone]   Fax:  [phone]  Physical Therapy Treatment  Patient Details  Name: Jennifer Noble MRN: [id number] Date of Birth: 07/06/1976 Referring Provider: Margarita Rana MD  Encounter Date: 11/14/2015      PT End of Session - 11/14/15 1743    Visit Number 6   Number of Visits 16   Date for PT Re-Evaluation 12/18/15   Activity Tolerance Patient tolerated treatment well   Behavior During Therapy [name] for tasks assessed/performed      Past Medical History  Diagnosis Date  . Hemorrhoid   . Dysuria     Past Surgical History  Procedure Laterality Date  . Hemorrhoid surgery      There were no vitals filed for this visit.  Visit Diagnosis:  Right knee pain  Right leg weakness  Localized edema  Abnormality of gait  Activity intolerance      Subjective Assessment - 11/14/15 1630    Subjective 4/10 pain walking  knee,, ankle pulls calf area first 10 minutes with walking with sneakers.   Currently in Pain? No/denies   Pain Score 6    Pain Location Knee   Pain Orientation Right   Pain Descriptors / Indicators Sore   Pain Frequency Intermittent   Aggravating Factors  walking   Pain Relieving Factors ice, elevation   Pain Location Ankle   Pain Orientation Posterior;Right   Pain Frequency Intermittent   Aggravating Factors  1st thing in morning   Pain Relieving Factors rest, sneakers, ice elevation            [facility] PT Assessment - 11/14/15 0001    Circumferential Edema   Circumferential - Right --  figure8 21.5 inches   AROM   Right Knee Flexion 93   PROM   Right Knee Flexion 95  recumbant                     [facility] Adult PT Treatment/Exercise - 11/14/15 0001    High Level Balance   High Level Balance Comments heel toe walk 10 feet X2,  high march with arm reach 10 feet X2   Knee/Hip Exercises: Aerobic   Recumbent Bike for ROM 3-4 minutes   Knee/Hip Exercises: Standing   Other Standing Knee Exercises Opposits knee and same arm reach for march in place, tandem weight shifting,  RT stand gluteal med work multiple reps.  Fatigues SBA for safety   Knee/Hip Exercises: Seated   Heel Slides 10 reps  for knee flexion, ankle DF using towel   Knee/Hip Exercises: Supine   Heel Slides 10 reps   Straight Leg Raises 10 reps  able to correct quad lag with cues   Straight Leg Raises Limitations prone SLR 10 X   Cryotherapy   Number Minutes Cryotherapy 15 Minutes   Cryotherapy Location Knee;Ankle   Type of Cryotherapy --  cold pack   Manual Therapy   Manual therapy comments retrograde RT calf,  tissue tight and softened a little, prior to tape distal to proximal quads.  2 X due to lotion.  Fan X 2 anterior shin for edema post retrograde , patella mobilization, brief.   Ankle Exercises: Standing   SLS 12 seconds   Heel Raises 10 reps  both   Toe Raise 10 reps  both   Other Standing Ankle Exercises plyotoss with Lt toe on floor,10 X and single leg rt 1 X SBA, difficult  Ankle Exercises: Stretches   Gastroc Stretch 3 reps;30 seconds  on step                PT Education - 11/14/15 1743    Education provided Yes   Education Details heel lift toe lifts   Person(s) Educated Patient;Spouse   Methods Explanation;Demonstration;Verbal cues;Handout   Comprehension Verbalized understanding;Returned demonstration          PT Short Term Goals - 11/14/15 1747    PT SHORT TERM GOAL #1   Title pt will be I with inital HEP (11/23/2015)   Time 4   Period Weeks   Status Achieved   PT SHORT TERM GOAL #2   Title pt will be able to verbalize and demonstrate techniques to reduce R knee inflammation and swelling via RICE and HEP (11/23/2015)   Baseline uses   Time 4   Period Weeks   Status Achieved   PT SHORT TERM GOAL #3   Title pt will improve her R knee flexion to >/= 80 degrees to assist with  funcitonal progression (11/23/2015)   Baseline 93   Time 4   Period Weeks   Status Achieved   PT SHORT TERM GOAL #4   Title pt will improve her R knee strength to >/=4-/5 to promote safety with weight bearing activities  (11/23/2015)   Time 4   Period Weeks   Status Unable to assess   PT SHORT TERM GOAL #5   Title pt will demonstate decreased R knee edema by >/= 1 cm to promote R knee AROM with </= 4/10 pain (11/23/2015)   Time 4   Period Weeks   Status Unable to assess           PT Long Term Goals - 10/23/15 1737    PT LONG TERM GOAL #1   Title pt will be I with all HEP as of last visit (12/18/2015)   Time 8   Period Weeks   Status New   PT LONG TERM GOAL #2   Title she will improver her R knee flexion to >/= 120 degrees to assist with normal and energy efficient gait pattern ( 12/18/2015)   Time 8   Period Weeks   Status New   PT LONG TERM GOAL #3   Title she will improve her R knee strenght to >/= 4/5 with </= 2/10 pain to promote safety with prolonged walking standing activities (12/18/2015)   Time 8   Period Weeks   Status New   PT LONG TERM GOAL #4   Title she will be able to walk/stand >/=45 min and ascending/ descend >/=10 step with LRAD and </= 2/10 pain to promote community ambulation for ADLS (12/18/2015)   Time 8   Period Weeks   Status New   PT LONG TERM GOAL #5   Title she will improver her FOTO score to >/= 64 at discharge to demonstrate improvment in function (12/18/2015)   Time 8   Period Weeks   Status New               Plan - 11/14/15 1743    Clinical Impression Statement No pain post exercise.  Walks with no assistive devices.  Could benifit from an ASO, copy of what onr looked like given.  Ligament support for ankle would be of benifit.  3 degrees DF with AAROM.   PT Next Visit Plan Balance, strengthening, closed chain knee and ankle   PT Home Exercise Plan standing ankle  Consulted and Agree with Plan of Care Patient;Family member/caregiver    Family Member Consulted husband        Problem List Patient Active Problem List   Diagnosis Date Noted  . Depression 08/17/2015  . Insomnia 08/17/2015  . Elevated lipids 04/08/2013  . Family history of early CAD 04/08/2013  . Hemorrhoid   . Dysuria     Zahki Hoogendoorn 11/14/2015, 5:53 PM  [facility] [address] [place], [place], [address] Phone: [phone]   Fax:  [phone]  Name: Jennifer Noble MRN: [id number] Date of Birth: 09/26/1975    Liz BeachKaren Annina Piotrowski, PTA 11/14/2015 5:53 PM Phone: [phone] Fax: [phone]

## 2015-11-16 ENCOUNTER — Ambulatory Visit: Payer: No Typology Code available for payment source | Admitting: Physical Therapy

## 2015-11-16 DIAGNOSIS — M25561 Pain in right knee: Secondary | ICD-10-CM

## 2015-11-16 DIAGNOSIS — R29898 Other symptoms and signs involving the musculoskeletal system: Secondary | ICD-10-CM

## 2015-11-16 NOTE — Therapy (Signed)
[facility] Outpatient Rehabilitation [facility] [address] [place], [place], [address] Phone: [phone]   Fax:  [phone]  Physical Therapy Treatment  Patient Details  Name: Jennifer Noble MRN: [id number] Date of Birth: [birth year] Referring Provider: Margarita Rana MD  Encounter Date: 11/16/2015      PT End of Session - 11/16/15 1738    Visit Number 7   Number of Visits 16   Date for PT Re-Evaluation 12/18/15   Activity Tolerance Patient tolerated treatment well   Behavior During Therapy [facility] for tasks assessed/performed      Past Medical History  Diagnosis Date  . Hemorrhoid   . Dysuria     Past Surgical History  Procedure Laterality Date  . Hemorrhoid surgery      There were no vitals filed for this visit.  Visit Diagnosis:  Right knee pain  Right leg weakness      Subjective Assessment - 11/16/15 1642    Subjective Sore in muscles after the last visit. Tape did not help swelling or calf pain   Patient is accompained by: Family member;Interpreter   Currently in Pain? No/denies   Pain Score 5    Pain Location Knee   Pain Orientation Right   Pain Descriptors / Indicators Sore   Pain Radiating Towards thigh and gluteals   Pain Score 5   Pain Location Ankle   Pain Orientation Right;Posterior;Anterior   Pain Frequency Intermittent   Aggravating Factors  ist thing in the morning, and with walking it get swollen.    Pain Relieving Factors Has a new brace, similar to ASO,   ice ,                          [facility] Adult PT Treatment/Exercise - 11/16/15 0001    Self-Care   Self-Care --  education on how exercise makes muscles sore.etc.     Knee/Hip Exercises: Aerobic   Recumbent Bike 6 minutes L5   Knee/Hip Exercises: Standing   SLS 5 X   Rebounder plyotoss SBA   Other Standing Knee Exercises Opposits knee and same arm reach for march in place, tandem weight shifting,  RT stand gluteal med work multiple reps.   Fatigues SBA for safety  retro walking,  gluteal med   Cryotherapy   Number Minutes Cryotherapy 10 Minutes   Cryotherapy Location Knee;Ankle   Type of Cryotherapy --  cold pack,  leg elevated.   Manual Therapy   Manual therapy comments retrograde work to thigh for edema at patient's request.   tissue congested    Ankle Exercises: Standing   SLS 10 seconds RT   Heel Raises 10 reps  both   Toe Raise 10 reps  both   Other Standing Ankle Exercises plyotoss  with toe tapLt, 20 X                PT Education - 11/16/15 1728    Education provided Yes   Education Details Diccussed arthritis. Discussed the use of heat /ice and when.  precautions.  Discussed soreness is to be expected after new exercises. many questions answered .     Person(s) Educated Patient;Spouse   Methods Explanation   Comprehension Verbalized understanding          PT Short Term Goals - 11/14/15 1747    PT SHORT TERM GOAL #1   Title pt will be I with inital HEP (11/23/2015)   Time 4   Period Weeks  Status Achieved   PT SHORT TERM GOAL #2   Title pt will be able to verbalize and demonstrate techniques to reduce R knee inflammation and swelling via RICE and HEP (11/23/2015)   Baseline uses   Time 4   Period Weeks   Status Achieved   PT SHORT TERM GOAL #3   Title pt will improve her R knee flexion to >/= 80 degrees to assist with funcitonal progression (11/23/2015)   Baseline 93   Time 4   Period Weeks   Status Achieved   PT SHORT TERM GOAL #4   Title pt will improve her R knee strength to >/=4-/5 to promote safety with weight bearing activities  (11/23/2015)   Time 4   Period Weeks   Status Unable to assess   PT SHORT TERM GOAL #5   Title pt will demonstate decreased R knee edema by >/= 1 cm to promote R knee AROM with </= 4/10 pain (11/23/2015)   Time 4   Period Weeks   Status Unable to assess           PT Long Term Goals - 10/23/15 1737    PT LONG TERM GOAL #1   Title pt will be I with all  HEP as of last visit (12/18/2015)   Time 8   Period Weeks   Status New   PT LONG TERM GOAL #2   Title she will improver her R knee flexion to >/= 120 degrees to assist with normal and energy efficient gait pattern ( 12/18/2015)   Time 8   Period Weeks   Status New   PT LONG TERM GOAL #3   Title she will improve her R knee strenght to >/= 4/5 with </= 2/10 pain to promote safety with prolonged walking standing activities (12/18/2015)   Time 8   Period Weeks   Status New   PT LONG TERM GOAL #4   Title she will be able to walk/stand >/=45 min and ascending/ descend >/=10 step with LRAD and </= 2/10 pain to promote community ambulation for ADLS (12/18/2015)   Time 8   Period Weeks   Status New   PT LONG TERM GOAL #5   Title she will improver her FOTO score to >/= 64 at discharge to demonstrate improvment in function (12/18/2015)   Time 8   Period Weeks   Status New               Plan - 11/16/15 1738    Clinical Impression Statement Ankle braces here.  I picked the best of the 2 they brought.  They did not have an ASO.  Brace was helpful with pain control.  Extra time spent on advice,  soreness with exercises, use of heat and Ice arthritis bolsters,.  Pain was reported 6/10 with exercise during session with no visible demonstrations of more than minimal pain.  Brief.  She felt less pain at end of session   PT Next Visit Plan Balance, strengthening, closed chain knee and ankle   PT Home Exercise Plan continue   Consulted and Agree with Plan of Care Patient   Family Member Consulted husband        Problem List Patient Active Problem List   Diagnosis Date Noted  . Depression 08/17/2015  . Insomnia 08/17/2015  . Elevated lipids 04/08/2013  . Family history of early CAD 04/08/2013  . Hemorrhoid   . Dysuria     HARRIS,KAREN 11/16/2015, 5:50 PM  [facility] [address] [place], [place], [address] Phone: [phone]   Fax:   [phone]  Name: Jennifer Noble MRN: [id number] Date of Birth: Jun 27, 1976    Liz Beach, PTA 11/16/2015 5:50 PM Phone: [phone] Fax: [phone]

## 2015-11-21 ENCOUNTER — Ambulatory Visit: Payer: No Typology Code available for payment source | Attending: Sports Medicine | Admitting: Physical Therapy

## 2015-11-21 DIAGNOSIS — M25661 Stiffness of right knee, not elsewhere classified: Secondary | ICD-10-CM | POA: Insufficient documentation

## 2015-11-21 DIAGNOSIS — M25571 Pain in right ankle and joints of right foot: Secondary | ICD-10-CM | POA: Insufficient documentation

## 2015-11-21 DIAGNOSIS — R600 Localized edema: Secondary | ICD-10-CM | POA: Insufficient documentation

## 2015-11-21 DIAGNOSIS — M6281 Muscle weakness (generalized): Secondary | ICD-10-CM | POA: Insufficient documentation

## 2015-11-21 DIAGNOSIS — M25561 Pain in right knee: Secondary | ICD-10-CM | POA: Insufficient documentation

## 2015-11-21 DIAGNOSIS — R2689 Other abnormalities of gait and mobility: Secondary | ICD-10-CM | POA: Insufficient documentation

## 2015-11-21 DIAGNOSIS — R29898 Other symptoms and signs involving the musculoskeletal system: Secondary | ICD-10-CM | POA: Insufficient documentation

## 2015-11-21 DIAGNOSIS — M25671 Stiffness of right ankle, not elsewhere classified: Secondary | ICD-10-CM | POA: Insufficient documentation

## 2015-11-21 NOTE — Therapy (Signed)
[facility] Outpatient Rehabilitation [facility] [address] [place], [place], [address] Phone: [phone]   Fax:  [phone]  Physical Therapy Treatment  Patient Details  Name: Jennifer Noble MRN: [id number] Date of Birth: 09-21-1975 Referring Provider: Margarita Rana MD  Encounter Date: 11/21/2015    Past Medical History  Diagnosis Date  . Hemorrhoid   . Dysuria     Past Surgical History  Procedure Laterality Date  . Hemorrhoid surgery      There were no vitals filed for this visit.  Visit Diagnosis:  Right knee pain  Muscle weakness (generalized)  Localized edema  Other abnormalities of gait and mobility  Other symptoms and signs involving the musculoskeletal system      Subjective Assessment - 11/21/15 1658    Subjective "I still have pain the knee and ankle rated about 5/10"   Currently in Pain? Yes   Pain Score 5    Pain Location Knee   Pain Orientation Right   Pain Descriptors / Indicators Sore   Pain Type Chronic pain   Aggravating Factors  walking, specifcally stepping down   Pain Relieving Factors ice, elevation   Pain Score 5   Pain Location Ankle   Pain Orientation Right   Pain Type Chronic pain   Pain Onset More than a month ago   Pain Frequency Intermittent   Aggravating Factors  stepping down during walking   Pain Relieving Factors brace            [facility] PT Assessment - 11/21/15 0001    Strength   Strength Assessment Site Ankle   Right/Left Ankle Right;Left   Right Ankle Dorsiflexion 3+/5   Right Ankle Plantar Flexion 3+/5   Right Ankle Inversion 3+/5   Right Ankle Eversion 3+/5   Left Ankle Dorsiflexion 4+/5   Left Ankle Plantar Flexion 4+/5   Left Ankle Inversion 4+/5   Left Ankle Eversion 4+/5                     [facility] Adult PT Treatment/Exercise - 11/21/15 0001    Static Standing Balance   Tandem Stance - Right Leg 30  x 2 in mild postural sway   Tandem Stance - Left Leg 30  x  2 with mild postural sway   Self-Care   Self-Care Other Self-Care Comments   Other Self-Care Comments  discussed RICE with specific emphasis on elevation and compression with ice to reduce swelling.   Knee/Hip Exercises: Aerobic   Recumbent Bike 5 min rocking 1/2 revolutions    Knee/Hip Exercises: Seated   Long Arc Quad 3 sets;10 reps;AROM;Strengthening;Right;Weights   Long Arc Quad Weight 3 lbs.   Stool Scoot - Round Trips 4 x 20 ft  cues to use R leg   Manual Therapy   Manual Therapy Joint mobilization;Myofascial release   Manual therapy comments retrograde STM with quad activiation to facilitate edema reduction   Joint Mobilization grade 2 tibiofemoral A>P to facilitate flexion    Myofascial Release manual trigger point release of the Distal quad x 3                  PT Short Term Goals - 11/21/15 1739    PT SHORT TERM GOAL #1   Title pt will be I with inital HEP (11/23/2015)   Time 4   Period Weeks   Status Achieved   PT SHORT TERM GOAL #2   Title pt will be able to verbalize and demonstrate techniques  to reduce R knee inflammation and swelling via RICE and HEP (11/23/2015)   Time 4   Period Weeks   Status Achieved   PT SHORT TERM GOAL #3   Title pt will improve her R knee flexion to >/= 80 degrees to assist with funcitonal progression (11/23/2015)   Time 4   Period Weeks   Status Achieved   PT SHORT TERM GOAL #4   Title pt will improve her R knee strength to >/=4-/5 to promote safety with weight bearing activities  (11/23/2015)   Time 4   Period Weeks   Status On-going   PT SHORT TERM GOAL #5   Title pt will demonstate decreased R knee edema by >/= 1 cm to promote R knee AROM with </= 4/10 pain (11/23/2015)   Time 4   Period Weeks   Status Unable to assess           PT Long Term Goals - 11/21/15 1737    PT LONG TERM GOAL #1   Title pt will be I with all HEP as of last visit (12/18/2015)   Time 8   Period Weeks   Status On-going   PT LONG TERM GOAL #2    Title she will improver her R knee flexion to >/= 120 degrees to assist with normal and energy efficient gait pattern ( 12/18/2015)   Time 8   Period Weeks   Status On-going   PT LONG TERM GOAL #3   Title she will improve her R knee strenght to >/= 4/5 with </= 2/10 pain to promote safety with prolonged walking standing activities (12/18/2015)   Time 8   Period Weeks   Status On-going   PT LONG TERM GOAL #4   Title she will be able to walk/stand >/=45 min and ascending/ descend >/=10 step with LRAD and </= 2/10 pain to promote community ambulation for ADLS (12/18/2015)   Time 8   Status On-going   PT LONG TERM GOAL #5   Title she will improver her FOTO score to >/= 64 at discharge to demonstrate improvment in function (12/18/2015)   Time 8   Period Weeks   Status On-going   Additional Long Term Goals   Additional Long Term Goals Yes   PT LONG TERM GOAL #6   Title pt will improve R ankle strength to >/=4/5 strength with </= 4/10 pain to assist with walking and standing activities    Time 4   Period Weeks   Status New               Plan - 11/21/15 1743    Clinical Impression Statement Jennifer Noble reports pain in both the knee and ankle that is 5/10 that only occurs with initial weight bearing. Worked on edema reduction to promote ROM  with mobilizations. She was able to perform stool scooting and balancing exercises without complaint of pain. Post session she reported pain dropped to a 3/10 and declined ice/ heat.    Pt will benefit from skilled therapeutic intervention in order to improve on the following deficits --  Right knee pain,Muscle weakness (generalized),Localized edema,Other abnormalities of gait and mobility,Other symptoms and signs involving the musculoskeletal system,   PT Next Visit Plan Balance, strengthening, closed chain knee and ankle   Consulted and Agree with Plan of Care Patient   Family Member Consulted husband        Problem List Patient Active Problem List    Diagnosis Date Noted  . Depression 08/17/2015  .  Insomnia 08/17/2015  . Elevated lipids 04/08/2013  . Family history of early CAD 04/08/2013  . Hemorrhoid   . Dysuria     Lulu Riding PT, DPT, LAT, ATC  11/21/2015  5:49 PM      [facility] [address] [place], [place], [address] Phone: [phone]   Fax:  [phone]  Name: Jennifer Noble MRN: [id number] Date of Birth: Nov 08, 1975

## 2015-11-23 ENCOUNTER — Ambulatory Visit: Payer: No Typology Code available for payment source | Admitting: Physical Therapy

## 2015-11-23 DIAGNOSIS — R600 Localized edema: Secondary | ICD-10-CM

## 2015-11-23 DIAGNOSIS — M6281 Muscle weakness (generalized): Secondary | ICD-10-CM

## 2015-11-23 DIAGNOSIS — R29898 Other symptoms and signs involving the musculoskeletal system: Secondary | ICD-10-CM

## 2015-11-23 DIAGNOSIS — M25561 Pain in right knee: Secondary | ICD-10-CM

## 2015-11-23 DIAGNOSIS — R2689 Other abnormalities of gait and mobility: Secondary | ICD-10-CM

## 2015-11-23 NOTE — Therapy (Addendum)
[facility] [address] [place], [place], [address] Phone: [phone]   Fax:  [phone]  Physical Therapy Treatment  Patient Details  Name: Braeley Denishia Citro MRN: [id number] Date of Birth: Jul 21, 1976 Referring Provider: Margarita Rana MD  Encounter Date: 11/23/2015      PT End of Session - 11/23/15 1723    Visit Number 8   Number of Visits 16   Date for PT Re-Evaluation 12/18/15   PT Start Time 1631   PT Stop Time 1718   PT Time Calculation (min) 47 min   Activity Tolerance Patient tolerated treatment well   Behavior During Therapy [facility] for tasks assessed/performed      Past Medical History  Diagnosis Date  . Hemorrhoid   . Dysuria     Past Surgical History  Procedure Laterality Date  . Hemorrhoid surgery      There were no vitals filed for this visit.  Visit Diagnosis:  Right knee pain  Muscle weakness (generalized)  Localized edema  Other abnormalities of gait and mobility  Other symptoms and signs involving the musculoskeletal system      Subjective Assessment - 11/23/15 1631    Subjective "I am feeling almost the same after I left last session"    Currently in Pain? Yes   Pain Score 3    Pain Orientation Right   Pain Descriptors / Indicators --  stiffness   Pain Type Chronic pain   Pain Score 4   Pain Location Ankle   Pain Orientation Right   Pain Type Chronic pain   Pain Onset More than a month ago   Pain Frequency Intermittent                         [facility] Adult PT Treatment/Exercise - 11/23/15 1641    Static Standing Balance   Single Leg Stance - Right Leg 30  x 2   Single Leg Stance - Left Leg 30  x 2   Self-Care   Self-Care --   Other Self-Care Comments  proper gait mechanics with focus of heel strike and toe off   Knee/Hip Exercises: Aerobic   Recumbent Bike x 5 min  moved foot in pedal forward    Knee/Hip Exercises: Machines for Strengthening   Cybex Leg Press Omega 1 x 10 25#, 1x 10 35#, 1 x 10 45#   Knee/Hip Exercises: Standing   Rebounder 1 x 10 Rhomber with red ball, 2 x 10 tosses tandem switching lead foot  R SLS 3 attempts with 5 tosses best     Gait Training exaggerated heel strike and toe off 4 x 20 ft, ascending /descending 6 inch  4 steps x 5 sets with cues for touch down with toes and controlling down   Knee/Hip Exercises: Seated   Long Arc Quad 10 reps;Right;AROM;Weights;3 sets  1 set with 4#,  set with 5#   Stool Scoot - Round Trips 4 x 30 ft                PT Education - 11/23/15 1721    Education provided Yes   Education Details educated on proper gait with focus on heel strike and toe off for energy efficiency   Person(s) Educated Patient   Methods Explanation   Comprehension Verbalized understanding          PT Short Term Goals - 11/21/15 1739    PT SHORT TERM GOAL #1   Title pt  will be I with inital HEP (11/23/2015)   Time 4   Period Weeks   Status Achieved   PT SHORT TERM GOAL #2   Title pt will be able to verbalize and demonstrate techniques to reduce R knee inflammation and swelling via RICE and HEP (11/23/2015)   Time 4   Period Weeks   Status Achieved   PT SHORT TERM GOAL #3   Title pt will improve her R knee flexion to >/= 80 degrees to assist with funcitonal progression (11/23/2015)   Time 4   Period Weeks   Status Achieved   PT SHORT TERM GOAL #4   Title pt will improve her R knee strength to >/=4-/5 to promote safety with weight bearing activities  (11/23/2015)   Time 4   Period Weeks   Status On-going   PT SHORT TERM GOAL #5   Title pt will demonstate decreased R knee edema by >/= 1 cm to promote R knee AROM with </= 4/10 pain (11/23/2015)   Time 4   Period Weeks   Status Unable to assess           PT Long Term Goals - 11/21/15 1737    PT LONG TERM GOAL #1   Title pt will be I with all HEP as of last visit (12/18/2015)   Time 8   Period Weeks   Status On-going   PT LONG  TERM GOAL #2   Title she will improver her R knee flexion to >/= 120 degrees to assist with normal and energy efficient gait pattern ( 12/18/2015)   Time 8   Period Weeks   Status On-going   PT LONG TERM GOAL #3   Title she will improve her R knee strenght to >/= 4/5 with </= 2/10 pain to promote safety with prolonged walking standing activities (12/18/2015)   Time 8   Period Weeks   Status On-going   PT LONG TERM GOAL #4   Title she will be able to walk/stand >/=45 min and ascending/ descend >/=10 step with LRAD and </= 2/10 pain to promote community ambulation for ADLS (12/18/2015)   Time 8   Status On-going   PT LONG TERM GOAL #5   Title she will improver her FOTO score to >/= 64 at discharge to demonstrate improvment in function (12/18/2015)   Time 8   Period Weeks   Status On-going   Additional Long Term Goals   Additional Long Term Goals Yes   PT LONG TERM GOAL #6   Title pt will improve R ankle strength to >/=4/5 strength with </= 4/10 pain to assist with walking and standing activities    Time 4   Period Weeks   Status New               Plan - 11/23/15 2039    Clinical Impression Statement Yanina continues to make progress and was able to make full revolutions with the bike. worked on proper gait training which she did well. she was able to do all exercise given and reported pain dropped to 2/10 post session. She reported ordering an ASO online and plans to bring it in when she gets it to be shown how to apply it.    PT Next Visit Plan Knee/ hip strengthening, closed chain knee and ankle, dynamic balance training, bike for knee mobility, Ankle strengthening/ balance without brace on   Consulted and Agree with Plan of Care Patient        Problem  List Patient Active Problem List   Diagnosis Date Noted  . Depression 08/17/2015  . Insomnia 08/17/2015  . Elevated lipids 04/08/2013  . Family history of early CAD 04/08/2013  . Hemorrhoid   . Dysuria    Lulu Riding PT, DPT, LAT, ATC  11/23/2015  8:43 PM          [facility] [address] [place], [place], [address] Phone: [phone]   Fax:  [phone]  Name: Clelia Ivie Savitt MRN: [id number] Date of Birth: December 07, 1975

## 2015-11-27 ENCOUNTER — Ambulatory Visit: Payer: No Typology Code available for payment source | Admitting: Physical Therapy

## 2015-11-27 DIAGNOSIS — M25661 Stiffness of right knee, not elsewhere classified: Secondary | ICD-10-CM

## 2015-11-27 DIAGNOSIS — M25671 Stiffness of right ankle, not elsewhere classified: Secondary | ICD-10-CM

## 2015-11-27 DIAGNOSIS — M25571 Pain in right ankle and joints of right foot: Secondary | ICD-10-CM

## 2015-11-27 DIAGNOSIS — R2689 Other abnormalities of gait and mobility: Secondary | ICD-10-CM

## 2015-11-27 DIAGNOSIS — M25561 Pain in right knee: Secondary | ICD-10-CM

## 2015-11-27 DIAGNOSIS — M6281 Muscle weakness (generalized): Secondary | ICD-10-CM

## 2015-11-27 DIAGNOSIS — R600 Localized edema: Secondary | ICD-10-CM

## 2015-11-27 NOTE — Therapy (Addendum)
[facility] [address] [place], [place], [address] Phone: [phone]   Fax:  [phone]  Physical Therapy Treatment  Patient Details  Name: Jennifer Noble MRN: [id number] Date of Birth: 03/04/1976 Referring Provider: Margarita Ranatimothy Murphy MD  Encounter Date: 11/27/2015      PT End of Session - 11/27/15 1740    Visit Number 9   Number of Visits 16   Date for PT Re-Evaluation 12/18/15   PT Start Time 1630   PT Stop Time 1735   PT Time Calculation (min) 65 min   Activity Tolerance Patient tolerated treatment well   Behavior [facility] Therapy [facility] for tasks assessed/performed      Past Medical History  Diagnosis Date  . Hemorrhoid   . Dysuria     Past Surgical History  Procedure Laterality Date  . Hemorrhoid surgery      There were no vitals filed for this visit.      Subjective Assessment - 11/27/15 1629    Subjective ASO brace here.  Mild ankle pain.  Sometimes hip hurts.     Patient is accompained by: Interpreter   Currently in Pain? Yes   Pain Score --  mild ankle,  intermittant hip pain, mild with no reason.  brief,  shooting pain.     Pain Location Knee   Pain Orientation Right   Pain Descriptors / Indicators Sore   Pain Frequency Intermittent   Aggravating Factors  weight bearing   Pain Location Ankle   Pain Orientation Right;Posterior;Lateral;Upper   Pain Descriptors / Indicators Aching;Tightness;Squeezing   Pain Frequency Intermittent   Aggravating Factors  weightbearing   Pain Relieving Factors rest, old brace, ice            [facility] PT Assessment - 11/27/15 1639    PROM   Right Knee Flexion 112  AA  with strap                     [facility] Adult PT Treatment/Exercise - 11/27/15 1639    High Level Balance   High Level Balance Comments steps with march and arm reach,  retro walk with opposite reach , heel toe, toe walking   Self-Care   Other Self-Care Comments  ASO application  instruction   Knee/Hip Exercises: Standing   Heel Raises 10 reps;2 sets  1 set single 4/10,  toe lifts 10 X 2 sets painful anterior f   Heel Raises Limitations tip toe walking   Functional Squat 4 sets  2 steps RT/LT mini squat, no band.     Wall Squat 10 reps  cues   SLS 25 seconds RT   SLS with Vectors 20 reps Single Leg,  SBA  plyotoss   Other Standing Knee Exercises retro walk with arm reach.  march forward with reach,  walking   SBA for Safety.   Knee/Hip Exercises: Seated   Stool Scoot - Round Trips 2 X 30 feet .     Sit to [employer] CorporationSand 10 reps  encouraged equal weight shifting   Knee/Hip Exercises: Supine   Heel Slides 10 reps  with strap   Bridges Limitations 10 X 2 sets, both   Cryotherapy   Number Minutes Cryotherapy 10 Minutes   Cryotherapy Location Knee;Ankle   Type of Cryotherapy --  coldpack   Ankle Exercises: Stretches   Gastroc Stretch 3 reps;30 seconds  RT with strap, 90 degrees  PT Education - 11/27/15 1724    Education provided Yes   Education Details Bridges.  ASO don/doff education.    Person(s) Educated Patient   Methods Explanation;Tactile cues;Verbal cues;Handout   Comprehension Verbalized understanding;Returned demonstration          PT Short Term Goals - 11/27/15 1756    PT SHORT TERM GOAL #1   Title pt will be I with inital HEP (11/23/2015)   Time 4   Period Weeks   Status Achieved   PT SHORT TERM GOAL #2   Title pt will be able to verbalize and demonstrate techniques to reduce R knee inflammation and swelling via RICE and HEP (11/23/2015)   Baseline uses   Time 4   Period Weeks   Status Achieved   PT SHORT TERM GOAL #3   Title pt will improve her R knee flexion to >/= 80 degrees to assist with funcitonal progression (11/23/2015)   Time 4   Period Weeks   Status Achieved   PT SHORT TERM GOAL #4   Title pt will improve her R knee strength to >/=4-/5 to promote safety with weight bearing activities  (11/23/2015)   Time 4    Period Weeks   Status Unable to assess   PT SHORT TERM GOAL #5   Title pt will demonstate decreased R knee edema by >/= 1 cm to promote R knee AROM with </= 4/10 pain (11/23/2015)   Time 4   Period Weeks   Status Unable to assess           PT Long Term Goals - 11/27/15 1757    PT LONG TERM GOAL #1   Title pt will be I with all HEP as of last visit (12/18/2015)   Baseline independent so far   Time 8   Period Weeks   Status On-going   PT LONG TERM GOAL #2   Title she will improver her R knee flexion to >/= 120 degrees to assist with normal and energy efficient gait pattern ( 12/18/2015)   Baseline 112, AAROM   Time 8   Period Weeks   Status On-going   PT LONG TERM GOAL #3   Title she will improve her R knee strenght to >/= 4/5 with </= 2/10 pain to promote safety with prolonged walking standing activities (12/18/2015)   Time 8   Period Weeks   Status Unable to assess   PT LONG TERM GOAL #4   Title she will be able to walk/stand >/=45 min and ascending/ descend >/=10 step with LRAD and </= 2/10 pain to promote community ambulation for ADLS (12/18/2015)   Time 8   Period Weeks   Status Unable to assess   PT LONG TERM GOAL #5   Time 8   Period Weeks   Status Unable to assess   PT LONG TERM GOAL #6   Title pt will improve R ankle strength to >/=4/5 strength with </= 4/10 pain to assist with walking and standing activities    Time 4   Period Weeks   Status Unable to assess               Plan - 11/27/15 1749    Clinical Impression Statement Jennifer Noble is improving with her knee flexion which today was 112 AAROM.  She now has her ASO Brace.  She is not sure if it helps yet.  She will continue to benifit from skilled PT to continue to address:  Right knee pain, pain in  right ankle and joints of right foot, stiffness of right knee, not elsewhere classified, stiffness of right ankle not elsewhere classified, localized edema, other abnormalities of gait and mobility, muscle weakness,  (generalized)4/10 pain in arch of foot with weight bearing exercises.     Rehab Potential Good   PT Frequency 2x / week   PT Duration 8 weeks   PT Treatment/Interventions ADLs/Self Care Home Management;Cryotherapy;Electrical Stimulation;Iontophoresis /ml Dexamethasone;Moist Heat;Therapeutic exercise;Manual techniques;Vasopneumatic Device;Passive range of motion;Taping;Dry needling;Ultrasound;Patient/family education;Therapeutic activities;Gait training   PT Next Visit Plan Knee/ hip strengthening, closed chain knee and ankle, dynamic balance training, bike for knee mobility, Ankle strengthening/ balance without brace on.  FOTO next visit.  She will need to schedule more appointments.    PT Home Exercise Plan added bridges to home exercise   Consulted and Agree with Plan of Care Patient      Patient will benefit from skilled therapeutic intervention in order to improve the following deficits and impairments:  Difficulty walking, Decreased range of motion, Decreased mobility, Decreased strength, Increased edema, Hypomobility, Decreased balance, Decreased activity tolerance, Postural dysfunction, Improper body mechanics, Pain  Visit Diagnosis: Right knee pain - Plan: PT plan of care cert/re-cert  Muscle weakness (generalized) - Plan: PT plan of care cert/re-cert  Other abnormalities of gait and mobility - Plan: PT plan of care cert/re-cert  Localized edema - Plan: PT plan of care cert/re-cert  Pain in right ankle and joints of right foot - Plan: PT plan of care cert/re-cert  Stiffness of right ankle, not elsewhere classified - Plan: PT plan of care cert/re-cert  Stiffness of right knee, not elsewhere classified - Plan: PT plan of care cert/re-cert     Problem List Patient Active Problem List   Diagnosis Date Noted  . Depression 08/17/2015  . Insomnia 08/17/2015  . Elevated lipids 04/08/2013  . Family history of early CAD 04/08/2013  . Hemorrhoid   . Dysuria     Jennifer Noble 11/27/2015, 6:17 PM  [facility] [address] [place], [place], [address] Phone: [phone]   Fax:  [phone]  Name: Jennifer Noble MRN: [id number] Date of Birth: Mar 18, 1976    Liz Beach, PTA 11/27/2015 6:17 PM Phone: [phone] Fax: [phone]

## 2015-11-27 NOTE — Patient Instructions (Signed)
Bridge    De espaldas, con las piernas dobladas a 90 y los pies planos en el suelo. Presione hacia arriba con el torso y las caderas,  Mantenga durante _1___ respiraciones.  10 - 20 X.  3-4 X semana.  Copyright  VHI. All rights reserved.

## 2015-11-27 NOTE — Addendum Note (Signed)
Addended by: Milford CageLEAMON, Loreen Bankson L on: 11/27/2015 06:20 PM   Modules accepted: Orders

## 2015-11-29 ENCOUNTER — Ambulatory Visit: Payer: No Typology Code available for payment source | Admitting: Physical Therapy

## 2015-11-29 DIAGNOSIS — M25571 Pain in right ankle and joints of right foot: Secondary | ICD-10-CM

## 2015-11-29 DIAGNOSIS — M6281 Muscle weakness (generalized): Secondary | ICD-10-CM

## 2015-11-29 DIAGNOSIS — M25661 Stiffness of right knee, not elsewhere classified: Secondary | ICD-10-CM

## 2015-11-29 DIAGNOSIS — R2689 Other abnormalities of gait and mobility: Secondary | ICD-10-CM

## 2015-11-29 DIAGNOSIS — M25561 Pain in right knee: Secondary | ICD-10-CM

## 2015-11-29 DIAGNOSIS — R600 Localized edema: Secondary | ICD-10-CM

## 2015-11-29 DIAGNOSIS — M25671 Stiffness of right ankle, not elsewhere classified: Secondary | ICD-10-CM

## 2015-11-29 NOTE — Therapy (Signed)
[facility] [address] [place], [place], [address] Phone: [phone]   Fax:  [phone]  Physical Therapy Treatment  Patient Details  Name: Jennifer Noble MRN: [id number] Date of Birth: 04/01/1976 Referring Provider: Margarita Rana MD  Encounter Date: 11/29/2015      PT End of Session - 11/29/15 1635    Visit Number 10   Number of Visits 16   Date for PT Re-Evaluation 12/18/15   PT Start Time 1628   PT Stop Time 1710   PT Time Calculation (min) 42 min   Activity Tolerance Patient tolerated treatment well   Behavior During Therapy [facility] for tasks assessed/performed      Past Medical History  Diagnosis Date  . Hemorrhoid   . Dysuria     Past Surgical History  Procedure Laterality Date  . Hemorrhoid surgery      There were no vitals filed for this visit.      Subjective Assessment - 11/29/15 1629    Currently in Pain? Yes   Pain Score 3    Pain Location Knee   Pain Orientation Right   Pain Onset More than a month ago   Pain Frequency Intermittent   Pain Score 3   Pain Location Ankle   Pain Descriptors / Indicators Aching;Sore   Pain Type Chronic pain   Pain Onset More than a month ago   Pain Frequency Intermittent            [facility] PT Assessment - 11/29/15 1643    Observation/Other Assessments   Focus on Therapeutic Outcomes (FOTO)  39% limited                     [facility] Adult PT Treatment/Exercise - 11/29/15 1634    Self-Care   Other Self-Care Comments  reviewed application of ASO brace and which strap to pull tight to avoid rolling the ankle.    Knee/Hip Exercises: Aerobic   Recumbent Bike L2 x 6 min   Knee/Hip Exercises: Standing   Heel Raises 10 reps;2 sets   Forward Lunges Both;1 set;10 reps  touching down onto bosu   Forward Lunges Limitations cues to bend the R knee   Knee/Hip Exercises: Seated   Long Arc Quad AROM;Strengthening;Right;10 reps;2 sets;Weights  7.5  #   Manual Therapy   Manual therapy comments STM over distal anterior tibialis tendon and around lateral malleoulus.    Joint Mobilization grade 2 anterior drawer of talocrural joint   Ankle Exercises: Seated   Other Seated Ankle Exercises 4  way ankle with red theraband x 2 10 each  cues to keep knee still to avoid compensation.                   PT Short Term Goals - 11/27/15 1756    PT SHORT TERM GOAL #1   Title pt will be I with inital HEP (11/23/2015)   Time 4   Period Weeks   Status Achieved   PT SHORT TERM GOAL #2   Title pt will be able to verbalize and demonstrate techniques to reduce R knee inflammation and swelling via RICE and HEP (11/23/2015)   Baseline uses   Time 4   Period Weeks   Status Achieved   PT SHORT TERM GOAL #3   Title pt will improve her R knee flexion to >/= 80 degrees to assist with funcitonal progression (11/23/2015)   Time 4   Period Weeks   Status Achieved  PT SHORT TERM GOAL #4   Title pt will improve her R knee strength to >/=4-/5 to promote safety with weight bearing activities  (11/23/2015)   Time 4   Period Weeks   Status Unable to assess   PT SHORT TERM GOAL #5   Title pt will demonstate decreased R knee edema by >/= 1 cm to promote R knee AROM with </= 4/10 pain (11/23/2015)   Time 4   Period Weeks   Status Unable to assess           PT Long Term Goals - 11/27/15 1757    PT LONG TERM GOAL #1   Title pt will be I with all HEP as of last visit (12/18/2015)   Baseline independent so far   Time 8   Period Weeks   Status On-going   PT LONG TERM GOAL #2   Title she will improver her R knee flexion to >/= 120 degrees to assist with normal and energy efficient gait pattern ( 12/18/2015)   Baseline 112, AAROM   Time 8   Period Weeks   Status On-going   PT LONG TERM GOAL #3   Title she will improve her R knee strenght to >/= 4/5 with </= 2/10 pain to promote safety with prolonged walking standing activities (12/18/2015)   Time 8    Period Weeks   Status Unable to assess   PT LONG TERM GOAL #4   Title she will be able to walk/stand >/=45 min and ascending/ descend >/=10 step with LRAD and </= 2/10 pain to promote community ambulation for ADLS (12/18/2015)   Time 8   Period Weeks   Status Unable to assess   PT LONG TERM GOAL #5   Time 8   Period Weeks   Status Unable to assess   PT LONG TERM GOAL #6   Title pt will improve R ankle strength to >/=4/5 strength with </= 4/10 pain to assist with walking and standing activities    Time 4   Period Weeks   Status Unable to assess               Plan - 11/29/15 1719    Clinical Impression Statement Jennifer Noble contineus to make progress improving her FOTO score to 61 today. Worked on ankle mobility and strengthening which she demonstrated difficulty with trying to compensate using the knee/ hip. Added 4-way ankle strengthening to HEP. post session pt reported pain inthe ankle and knee dropped to 1/10. plan to continue with therapy dropping to 1 x a week for the next 4 weeks to transition to independent exercise.     PT Next Visit Plan Knee/ hip strengthening, closed chain knee and ankle, dynamic balance training, bike for knee mobility, Ankle strengthening/ balance without brace on. ICD 10 CERT already completed   PT Home Exercise Plan 4-way ankle strengthening.    Consulted and Agree with Plan of Care Patient      Patient will benefit from skilled therapeutic intervention in order to improve the following deficits and impairments:  Difficulty walking, Decreased range of motion, Decreased mobility, Decreased strength, Increased edema, Hypomobility, Decreased balance, Decreased activity tolerance, Postural dysfunction, Improper body mechanics, Pain  Visit Diagnosis: Right knee pain  Muscle weakness (generalized)  Other abnormalities of gait and mobility  Localized edema  Pain in right ankle and joints of right foot  Stiffness of right ankle, not elsewhere  classified  Stiffness of right knee, not elsewhere classified  Problem List Patient Active Problem List   Diagnosis Date Noted  . Depression 08/17/2015  . Insomnia 08/17/2015  . Elevated lipids 04/08/2013  . Family history of early CAD 04/08/2013  . Hemorrhoid   . Dysuria    Jennifer Noble PT, DPT, LAT, ATC  11/29/2015  5:24 PM      [facility] [address] [place], [place], [address] Phone: [phone]   Fax:  [phone]  Name: Jennifer Noble MRN: [id number] Date of Birth: 01/06/1976

## 2015-12-05 ENCOUNTER — Ambulatory Visit: Payer: No Typology Code available for payment source | Admitting: Physical Therapy

## 2015-12-11 ENCOUNTER — Ambulatory Visit: Payer: No Typology Code available for payment source | Admitting: Physical Therapy

## 2015-12-11 DIAGNOSIS — M25661 Stiffness of right knee, not elsewhere classified: Secondary | ICD-10-CM

## 2015-12-11 DIAGNOSIS — M25571 Pain in right ankle and joints of right foot: Secondary | ICD-10-CM

## 2015-12-11 DIAGNOSIS — M6281 Muscle weakness (generalized): Secondary | ICD-10-CM

## 2015-12-11 DIAGNOSIS — M25671 Stiffness of right ankle, not elsewhere classified: Secondary | ICD-10-CM

## 2015-12-11 DIAGNOSIS — R29898 Other symptoms and signs involving the musculoskeletal system: Secondary | ICD-10-CM

## 2015-12-11 DIAGNOSIS — R600 Localized edema: Secondary | ICD-10-CM

## 2015-12-11 DIAGNOSIS — R2689 Other abnormalities of gait and mobility: Secondary | ICD-10-CM

## 2015-12-11 DIAGNOSIS — M25561 Pain in right knee: Secondary | ICD-10-CM

## 2015-12-11 NOTE — Therapy (Signed)
[facility] [address] [place], [place], [address] Phone: [phone]   Fax:  [phone]  Physical Therapy Treatment  Patient Details  Name: Jennifer Noble MRN: [id number] Date of Birth: 11/21/1975 Referring Provider: Margarita Ranatimothy Murphy MD  Encounter Date: 12/11/2015      PT End of Session - 12/11/15 1152    Visit Number 11   Number of Visits 16   Date for PT Re-Evaluation 12/18/15      Past Medical History  Diagnosis Date  . Hemorrhoid   . Dysuria     Past Surgical History  Procedure Laterality Date  . Hemorrhoid surgery      There were no vitals filed for this visit.      Subjective Assessment - 12/11/15 1152    Subjective "ive been doing well, pain only 2-3"   Currently in Pain? Yes   Pain Score 2    Pain Location Knee   Pain Orientation Right   Pain Descriptors / Indicators Sore   Pain Type Chronic pain   Pain Onset More than a month ago   Pain Frequency Intermittent   Aggravating Factors  stiffness fiirst in the morning   Pain Relieving Factors walking, movement                         [facility] Adult PT Treatment/Exercise - 12/11/15 1154    Static Standing Balance   Single Leg Stance - Right Leg 30  eyes open 30 sec, eyes closed 10 best time multip trys   Knee/Hip Exercises: Aerobic   Recumbent Bike L3 x 6 min   Knee/Hip Exercises: Machines for Strengthening   Cybex Leg Press Omega 1 x 10 55#, 1 x 65#, 1 x 10 75#.   Knee/Hip Exercises: Standing   Heel Raises 2 sets;20 reps   Step Down Right;1 set;10 reps;Step Height: 6"  cues for proper form   Wall Squat 2 sets;10 reps   Gait Training 6 x 20 ft with cues on toe off and arm swing with alternating speeds   reported feeling better    Knee/Hip Exercises: Seated   Long Arc Quad 2 sets;15 reps  6                  PT Short Term Goals - 11/27/15 1756    PT SHORT TERM GOAL #1   Title pt will be I with inital HEP  (11/23/2015)   Time 4   Period Weeks   Status Achieved   PT SHORT TERM GOAL #2   Title pt will be able to verbalize and demonstrate techniques to reduce R knee inflammation and swelling via RICE and HEP (11/23/2015)   Baseline uses   Time 4   Period Weeks   Status Achieved   PT SHORT TERM GOAL #3   Title pt will improve her R knee flexion to >/= 80 degrees to assist with funcitonal progression (11/23/2015)   Time 4   Period Weeks   Status Achieved   PT SHORT TERM GOAL #4   Title pt will improve her R knee strength to >/=4-/5 to promote safety with weight bearing activities  (11/23/2015)   Time 4   Period Weeks   Status Unable to assess   PT SHORT TERM GOAL #5   Title pt will demonstate decreased R knee edema by >/= 1 cm to promote R knee AROM with </= 4/10 pain (11/23/2015)   Time 4  Period Weeks   Status Unable to assess           PT Long Term Goals - 11/27/15 1757    PT LONG TERM GOAL #1   Title pt will be I with all HEP as of last visit (12/18/2015)   Baseline independent so far   Time 8   Period Weeks   Status On-going   PT LONG TERM GOAL #2   Title she will improver her R knee flexion to >/= 120 degrees to assist with normal and energy efficient gait pattern ( 12/18/2015)   Baseline 112, AAROM   Time 8   Period Weeks   Status On-going   PT LONG TERM GOAL #3   Title she will improve her R knee strenght to >/= 4/5 with </= 2/10 pain to promote safety with prolonged walking standing activities (12/18/2015)   Time 8   Period Weeks   Status Unable to assess   PT LONG TERM GOAL #4   Title she will be able to walk/stand >/=45 min and ascending/ descend >/=10 step with LRAD and </= 2/10 pain to promote community ambulation for ADLS (12/18/2015)   Time 8   Period Weeks   Status Unable to assess   PT LONG TERM GOAL #5   Time 8   Period Weeks   Status Unable to assess   PT LONG TERM GOAL #6   Title pt will improve R ankle strength to >/=4/5 strength with </= 4/10 pain to assist  with walking and standing activities    Time 4   Period Weeks   Status Unable to assess               Plan - 12/11/15 1239    Clinical Impression Statement Jennifer Noble reports she is doing well. focused on hip/ knee strengthening and gait training today. discussed with pt that she is doing well and based on her current level of function next vsiit may be her last visit. Post session pt reported 0/10 pain with walking/ standing.    PT Next Visit Plan  ICD 10 CERT already completed, Goals, HEP, discharge, FOTO   PT Home Exercise Plan upgraded resistance theraband for ankle strengthening.    Consulted and Agree with Plan of Care Patient      Patient will benefit from skilled therapeutic intervention in order to improve the following deficits and impairments:  Difficulty walking, Decreased range of motion, Decreased mobility, Decreased strength, Increased edema, Hypomobility, Decreased balance, Decreased activity tolerance, Postural dysfunction, Improper body mechanics, Pain  Visit Diagnosis: Right knee pain  Muscle weakness (generalized)  Other abnormalities of gait and mobility  Localized edema  Pain in right ankle and joints of right foot  Stiffness of right ankle, not elsewhere classified  Stiffness of right knee, not elsewhere classified  Other symptoms and signs involving the musculoskeletal system     Problem List Patient Active Problem List   Diagnosis Date Noted  . Depression 08/17/2015  . Insomnia 08/17/2015  . Elevated lipids 04/08/2013  . Family history of early CAD 04/08/2013  . Hemorrhoid   . Dysuria    Jennifer Noble PT, DPT, LAT, ATC  12/11/2015  12:47 PM      [facility] [address] [place], [place], [address] Phone: [phone]   Fax:  [phone]  Name: Jennifer Noble MRN: [id number] Date of Birth: [birth year]

## 2015-12-18 ENCOUNTER — Ambulatory Visit: Payer: No Typology Code available for payment source | Attending: Sports Medicine | Admitting: Physical Therapy

## 2015-12-18 DIAGNOSIS — M6281 Muscle weakness (generalized): Secondary | ICD-10-CM

## 2015-12-18 DIAGNOSIS — R29898 Other symptoms and signs involving the musculoskeletal system: Secondary | ICD-10-CM

## 2015-12-18 DIAGNOSIS — M25671 Stiffness of right ankle, not elsewhere classified: Secondary | ICD-10-CM | POA: Insufficient documentation

## 2015-12-18 DIAGNOSIS — M25661 Stiffness of right knee, not elsewhere classified: Secondary | ICD-10-CM | POA: Insufficient documentation

## 2015-12-18 DIAGNOSIS — M25561 Pain in right knee: Secondary | ICD-10-CM | POA: Insufficient documentation

## 2015-12-18 DIAGNOSIS — R2689 Other abnormalities of gait and mobility: Secondary | ICD-10-CM | POA: Insufficient documentation

## 2015-12-18 DIAGNOSIS — M25571 Pain in right ankle and joints of right foot: Secondary | ICD-10-CM

## 2015-12-18 DIAGNOSIS — R600 Localized edema: Secondary | ICD-10-CM

## 2015-12-18 NOTE — Therapy (Signed)
[place], [place], [address] Phone: [phone]   Fax:  [phone]  Physical Therapy Treatment / Discharge Note  Patient Details  Name: Jennifer Noble MRN: [id number] Date of Birth: Mar 07, 1976 Referring Provider: Edmonia Lynch MD  Encounter Date: 12/18/2015      PT End of Session - 12/18/15 1715    Visit Number 12   Number of Visits 16   Date for PT Re-Evaluation 12/18/15   PT Start Time 1630   PT Stop Time 5366   PT Time Calculation (min) 45 min   Activity Tolerance Patient tolerated treatment well   Behavior During Therapy [facility] for tasks assessed/performed      Past Medical History  Diagnosis Date  . Hemorrhoid   . Dysuria     Past Surgical History  Procedure Laterality Date  . Hemorrhoid surgery      There were no vitals filed for this visit.      Subjective Assessment - 12/18/15 1630    Subjective "I am having some swelling the foot from working too much"  pt reported seeing the MD and was given a cream and a sleeve for swelling.   Currently in Pain? Yes   Pain Score 1    Pain Location Knee   Pain Orientation Right   Pain Score 5   Pain Location Ankle   Pain Orientation Right   Pain Descriptors / Indicators Aching;Sore   Pain Type Chronic pain   Pain Onset More than a month ago   Aggravating Factors  prolonged standing   Pain Relieving Factors rest, ASO brace,             [facility] PT Assessment - 12/18/15 0001    AROM   Right Knee Extension 0   Right Knee Flexion 130   PROM   Right Knee Extension 0   Right Knee Flexion 10   Strength   Right Knee Flexion 4+/5   Right Knee Extension 4+/5   Right Ankle Dorsiflexion 5/5   Right Ankle Plantar Flexion 5/5   Right Ankle Inversion 4+/5   Right Ankle Eversion 4+/5                     [facility] Adult PT Treatment/Exercise - 12/18/15 0001    Self-Care   Other Self-Care Comments  edema reduction massage  and that she needs to elevate her ankle while performing ankle pumps to help reduce swelling in the foot and ankle.    Knee/Hip Exercises: Aerobic   Recumbent Bike L 3 x 6 min   Manual Therapy   Manual therapy comments Edema reduction massage with foot elevated on bolster   pt reported 0/10 pain in supine, and 3/10 pain standing/walk                PT Education - 12/18/15 1715    Education provided Yes   Education Details HEP review, edema reduction massage and if possible slowly transition to full time work.   Person(s) Educated Patient   Methods Explanation   Comprehension Verbalized understanding          PT Short Term Goals - 12/18/15 1657    PT SHORT TERM GOAL #1   Title pt will be I with inital HEP (11/23/2015)   Period Weeks   Status Achieved   PT SHORT TERM GOAL #2   Title pt will be able to verbalize and demonstrate techniques to reduce R knee inflammation and  swelling via RICE and HEP (11/23/2015)   Time 4   Period Weeks   Status Achieved   PT SHORT TERM GOAL #3   Title pt will improve her R knee flexion to >/= 80 degrees to assist with funcitonal progression (11/23/2015)   Baseline 93   Period Weeks   Status Achieved   PT SHORT TERM GOAL #4   Title pt will improve her R knee strength to >/=4-/5 to promote safety with weight bearing activities  (11/23/2015)   Period Weeks   Status Achieved   PT SHORT TERM GOAL #5   Title pt will demonstate decreased R knee edema by >/= 1 cm to promote R knee AROM with </= 4/10 pain (11/23/2015)   Time 4   Period Weeks   Status Achieved           PT Long Term Goals - 12/18/15 1659    PT LONG TERM GOAL #1   Title pt will be I with all HEP as of last visit (12/18/2015)   Time 8   Period Weeks   Status Achieved   PT LONG TERM GOAL #2   Title she will improver her R knee flexion to >/= 120 degrees to assist with normal and energy efficient gait pattern ( 12/18/2015)   Baseline 130   Time 8   Period Weeks   Status Achieved    PT LONG TERM GOAL #3   Title she will improve her R knee strenght to >/= 4/5 with </= 2/10 pain to promote safety with prolonged walking standing activities (12/18/2015)   Time 8   Period Weeks   Status Achieved   PT LONG TERM GOAL #4   Title she will be able to walk/stand >/=45 min and ascending/ descend >/=10 step with LRAD and </= 2/10 pain to promote community ambulation for ADLS (11/23/4257)   Time 8   Period Weeks   Status Achieved   PT LONG TERM GOAL #5   Title she will improver her FOTO score to >/= 64 at discharge to demonstrate improvment in function (12/18/2015)   Baseline 68   Time 8   Period Weeks   Status Achieved   PT LONG TERM GOAL #6   Title pt will improve R ankle strength to >/=4/5 strength with </= 4/10 pain to assist with walking and standing activities    Time 4   Period Weeks   Status Achieved               Plan - 12/18/15 1716    Clinical Impression Statement Danali states she got increased swelling in the foot which occured from her starting work full time. she reports seeing her MD which he gave her a compression stocking for the swelling. She improved her knee and ankle AROM and strength. performed manual today to reduce swelling, and educated how to perform at home. She met all goals today and reports she is able to maintain and progress her current level of function indepenedlty and will be discharged from physical therapy today.    PT Next Visit Plan discharged from PT   PT Home Exercise Plan HEP review,    Consulted and Agree with Plan of Care Patient      Patient will benefit from skilled therapeutic intervention in order to improve the following deficits and impairments:  Difficulty walking, Decreased range of motion, Decreased mobility, Decreased strength, Increased edema, Hypomobility, Decreased balance, Decreased activity tolerance, Postural dysfunction, Improper body mechanics, Pain  Visit Diagnosis:  Right knee pain  Muscle weakness  (generalized)  Other abnormalities of gait and mobility  Localized edema  Pain in right ankle and joints of right foot  Stiffness of right ankle, not elsewhere classified  Stiffness of right knee, not elsewhere classified  Other symptoms and signs involving the musculoskeletal system     Problem List Patient Active Problem List   Diagnosis Date Noted  . Depression 08/17/2015  . Insomnia 08/17/2015  . Elevated lipids 04/08/2013  . Family history of early CAD 04/08/2013  . Hemorrhoid   . Dysuria    Starr Lake PT, DPT, LAT, ATC  12/18/2015  5:26 PM      [facility] [address] [place], [place], [address] Phone: [phone]   Fax:  [phone]  Name: Edwardine Temara Lanum MRN: [id number] Date of Birth: 08/01/1976   PHYSICAL THERAPY DISCHARGE SUMMARY  Visits from Start of Care: 12  Current functional level related to goals / functional outcomes: FOTO 32% limited   Remaining deficits: Intermittent swelling in the knee and ankle with prolonged standing/ walking activities related to working. Intermittent pain related to the amount of swelling located in the ankle. Patient able to demonstrate normal gait pattern and demonstrates functional knee/ ankle strength but lacks endurance with prolonged walking/ standing activities.    Education / Equipment: HEP, Neurosurgeon, theraband for strengthening, edema reduction with elevation  Plan: Patient agrees to discharge.  Patient goals were met. Patient is being discharged due to meeting the stated rehab goals.  ?????

## 2015-12-25 ENCOUNTER — Encounter: Payer: No Typology Code available for payment source | Admitting: Physical Therapy

## 2016-01-01 ENCOUNTER — Encounter: Payer: No Typology Code available for payment source | Admitting: Physical Therapy

## 2016-03-14 ENCOUNTER — Other Ambulatory Visit: Payer: Self-pay | Admitting: Primary Care

## 2016-03-14 DIAGNOSIS — R921 Mammographic calcification found on diagnostic imaging of breast: Secondary | ICD-10-CM

## 2016-03-18 ENCOUNTER — Ambulatory Visit
Admission: RE | Admit: 2016-03-18 | Discharge: 2016-03-18 | Disposition: A | Discharge status: Home / Self Care | Payer: No Typology Code available for payment source | Source: Ambulatory Visit | Attending: Primary Care | Admitting: Primary Care

## 2016-03-18 DIAGNOSIS — R921 Mammographic calcification found on diagnostic imaging of breast: Secondary | ICD-10-CM

## 2016-05-07 ENCOUNTER — Telehealth: Payer: Self-pay | Admitting: Neurology

## 2016-05-07 ENCOUNTER — Encounter: Payer: Self-pay | Admitting: Neurology

## 2016-05-07 ENCOUNTER — Ambulatory Visit (INDEPENDENT_AMBULATORY_CARE_PROVIDER_SITE_OTHER): Payer: Self-pay | Admitting: Neurology

## 2016-05-07 VITALS — BP 112/74 | HR 77 | Ht 64.0 in | Wt 150.2 lb

## 2016-05-07 DIAGNOSIS — R51 Headache: Secondary | ICD-10-CM

## 2016-05-07 DIAGNOSIS — R519 Headache, unspecified: Secondary | ICD-10-CM | POA: Insufficient documentation

## 2016-05-07 DIAGNOSIS — F329 Major depressive disorder, single episode, unspecified: Secondary | ICD-10-CM

## 2016-05-07 DIAGNOSIS — F32A Depression, unspecified: Secondary | ICD-10-CM

## 2016-05-07 MED ORDER — VENLAFAXINE HCL ER 37.5 MG PO CP24: 75.0000 mg | ORAL_CAPSULE | Freq: Every day | ORAL | 11 refills | Status: DC

## 2016-05-07 NOTE — Telephone Encounter (Signed)
Julianna (pt's neighbor) called said the pt has to pay out of pocket and is requesting a different medication

## 2016-05-07 NOTE — Telephone Encounter (Signed)
Pt's daughter called said venlafaxine XR (EFFEXOR XR) 37.5 MG 24 hr capsule cost $45 before, it will be $108 now. I advised her to speak with the pharmacist. She did not understand and kept asking the same question over and over again.
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
## 2016-05-08 ENCOUNTER — Other Ambulatory Visit: Payer: Self-pay | Admitting: *Deleted

## 2016-05-08 ENCOUNTER — Encounter: Payer: Self-pay | Admitting: *Deleted

## 2016-05-08 ENCOUNTER — Telehealth: Payer: Self-pay | Admitting: Neurology

## 2016-05-08 MED ORDER — CITALOPRAM HYDROBROMIDE 20 MG PO TABS: 20.0000 mg | ORAL_TABLET | Freq: Every day | ORAL | 5 refills | Status: DC

## 2016-05-08 NOTE — Addendum Note (Signed)
Addended by: Lindell SparKIRKMAN, Aryeh Butterfield C on: 05/08/2016 05:44 PM   Modules accepted: Orders

## 2016-05-08 NOTE — Telephone Encounter (Signed)
Julianna , pts neighbor called for the pt , due to language barrier stating the pt tried using the coupon but it is still to expensive. Please call and advise

## 2016-05-08 NOTE — Telephone Encounter (Signed)
[facility].  Printed coupon and faxed to pharmacy.  They will reprocess the rx and call pt with the discounted price.  Returned call to patient - left message notifying her know the status.

## 2016-05-08 NOTE — Telephone Encounter (Signed)
Patient returned call with an interpreter available.  She is aware that a [facility]

## 2016-05-08 NOTE — Telephone Encounter (Signed)
Per Dr. Terrace ArabiaYan, provide rx for citalopram 20mg , qd.  Rx sent to pharmacy.  Patient has been notified (she had an interpreter available).

## 2016-05-08 NOTE — Telephone Encounter (Signed)
Unidentified caller [phone] called requesting someone who speaks Spanish to talk to patient because she had to go and couldn't interpret for her. Please call with arranged time to speak with patient and interpreter. Foreign Language Interpreter/ Over the phone Interpreter 24/7 [phone] provide access code [phone] (telephonic interpretation only).

## 2016-05-09 NOTE — Telephone Encounter (Signed)
A message from the answering service. She had called about her recent prescription not being the right dose.   There was no answer at the number provided. The number provided was [phone]. A message was left..Marland Kitchen

## 2016-05-10 ENCOUNTER — Other Ambulatory Visit: Payer: Self-pay | Admitting: *Deleted

## 2016-05-10 ENCOUNTER — Telehealth: Payer: Self-pay | Admitting: Neurology

## 2016-05-10 NOTE — Telephone Encounter (Signed)
Unfortunately, the pharmacy will not refund the money for venafaxine because she could not tolerate the medication.  A new rx for citalopram has been sent to the pharmacy and the cash co-pay will only be $4.00.  Victorino DikeJennifer is aware and will explain to the patient.

## 2016-05-10 NOTE — Telephone Encounter (Signed)
Charlyne MomJennifer Popoca, Translator [phone] called to advise, [facility], asks if there is anything we can do to help w/this.

## 2016-05-13 ENCOUNTER — Telehealth: Payer: Self-pay | Admitting: *Deleted

## 2016-05-13 NOTE — Telephone Encounter (Signed)
Patient here with her translator, Charlyne MomJennifer Popoca [phone]).  She has only taken two doses of citalopram.  I explained to her that antidepressants do not work right away and can take a month to really start working.  Instructed her to go ahead and schedule a one month follow up with her primary care doctor.  She was agreeable to this plan.

## 2016-05-15 ENCOUNTER — Encounter (HOSPITAL_COMMUNITY): Payer: Self-pay | Admitting: Emergency Medicine

## 2016-05-15 ENCOUNTER — Emergency Department (HOSPITAL_COMMUNITY)
Admission: EM | Admit: 2016-05-15 | Discharge: 2016-05-15 | Disposition: A | Discharge status: Home / Self Care | Payer: No Typology Code available for payment source | Attending: Emergency Medicine | Admitting: Emergency Medicine

## 2016-05-15 DIAGNOSIS — G4700 Insomnia, unspecified: Secondary | ICD-10-CM | POA: Insufficient documentation

## 2016-05-15 HISTORY — DX: Depression, unspecified: F32.A

## 2016-05-15 HISTORY — DX: Anxiety disorder, unspecified: F41.9

## 2016-05-15 HISTORY — DX: Major depressive disorder, single episode, unspecified: F32.9

## 2016-05-15 MED ORDER — HYDROXYZINE HCL 25 MG PO TABS: 25.0000 mg | ORAL_TABLET | Freq: Four times a day (QID) | ORAL | 0 refills | Status: DC

## 2016-05-15 NOTE — ED Triage Notes (Signed)
Pt states she hasn't been able to sleep for about 6 months. Pt has not been on any sleeping medications. Pt was just started on citalopram and feels it has messed up her sleep. Pt also states 6 months ago she was started on venlafaxine that disrupted her sleep as well. Pt is no longer taking the venlafaxine.

## 2016-05-15 NOTE — Discharge Instructions (Signed)
Substance Abuse Treatment Programs ° °Intensive Outpatient Programs °[facility]     °[address]      °[place], [place]                   °[phone]      ° °[facility] °[address] °[place], [place] °336-379-7146 ° °[facility]     °(Inpatient and outpatient)     °[address]           °336-832-9800   ° °[facility] °336-288-1484 (Suboxone and Methadone) ° °[address]      °[place], [place] [address]      °336-882-2125      ° °[address] °[place], [place] °852-3033 ° °[facility] (Outpatient/Inpatient, Chemical)    °(insurance only) [phone]      °       °[facility] (Groups & Residential) °[place], [place] °336-389-1413 ° °   °[facility]     °[address]     °[place], [place]      °336-389-1413      ° °[facility] (for caregivers and family) °[address] °[place], [place] °336-299-4655 ° ° ° ° ° °Residential Treatment Programs °[facility]      °[address], [place], [place] [address]  °([phone]) [phone]      ° °[facility] °[address] [place], [place] [address] °[phone] ° °[facility]        °336-248-8914      ° °[facility] °1-800-659-3381 ° °[facility]             °[address]                                         °[place], [place]                                                °877-615-2722 or 336-784-9470                              ° °[facility] [place] °[address] °[place], [address] °1.877.941.8954 ° °[facility]    °[address]      °[place], [place]     °336-273-5306      ° °[facility] °[address] °, [place] °336-285-9073 ° °[facility]   °[address]     °[place], [place] [address]     °336-899-1550      °Admissions: 8am-3pm M-F ° °[facility] °[address] °[place],  [place] °336-227-7417 ° °[facility]: Residential Program (90 Days)   °[place], [place]      °336-725-8389 or 800-758-6077    ° °[facility] °[place], [place] °(Walk in Hours over the weekend or by referral) ° °[facility] °[address], [place],  [address] °(336) [phone] ° °Crisis Mobile: [facility]:  1-877-626-1772 (for crisis response 24 hours a day) °[facility] Hotline:      1-800-256-2452 °Outpatient Psychiatry and Counseling ° °[facility]: Mobile Crisis   Management 24 hours:  1-877-626-1772 ° °[facility] sliding scale fee and walk in schedule: M-F 8am-12pm/1pm-3pm °[address]  °[place], [place] [address] °336-387-6161 ° °[facility] °[address] °[place], [place] [address] °336-703-9650 ° °[facility] ([facility] known [facility])- new patient walk-in appointments available Monday - Friday 8am -3pm.          °[address] °[place], [place] [address] °336-676-6840 or crisis line- 336-676-6905 ° °[facility] Intensive Outpatient Therapy Program °[address] °[place], [place] [address] °336-832-9804 ° °[facility]                  °Crisis Services      °336.641.4993      °[address]     °[place], [place] [address]                ° °[facility]   °[facility] °[phone] °[address] °[place], [place] [address] ° ° °[facility]          °[address]  °[place], [place] [address]       °(336) [phone] ° °[facility] °[address] °[place], [place] [address] °336-292-1510 ° °[facility]    °[address]    °[place], [place] [address]     °336-632-3505      ° °Parish McKinney, MD     °[address]     °[place] [address]     °336-282-1251     °  °[facility] °[address] °[place] [address] ° °[facility]     °[address]     °[place], [place]      °336-542-2076      ° °[facility] °Shamsher Ahluwalia, MD °[address] °[place], [place] [address] °336-420-9558 ° °[facility]     °[address]     °[place], [place] [address]     °336-274-1237      ° °[facility] °[address] °[place], [place] [address] °336-854-4450 °Resources for Temporary Residential Assistance/Crisis Centers ° °DAY CENTERS °[facility] °M-F 8am-3pm   °[address] [place], [place] [address]   336-332-0824 °Services include: laundry, barbering, support groups, case management, phone  & computer access, showers, AA/NA mtgs, mental health/substance abuse nurse, job skills class, disability information, VA assistance, spiritual classes, etc.  ° °HOMELESS SHELTERS ° °[facility]     °[facility]   °[address], [place]     °336.271.5959       °       °[facility] (women and children)       °[address] °[place], [place] [address] °336-275-0820 °[email or web address] for application and process °Application Required ° °[facility]   °[address]    °[place] [address]     °336.886.4922       °             °[facility] °[address] °[place], [place] [address] °336.273.5572 °336-235-0363(schedule application appt.) °Application Required ° °[facility] (women only)    °[address]     °[place],  [address]     °336-884-1039      °  Intake starts 6pm daily °Need valid ID, SSC, & Police report °[facility] [place] °[address] °[place], [place] °336-881-5420 °Application Required ° °[facility] (men only)     °[address]      °[place], [place]     °336.748.1962      ° °[facility] °(Pregnant women only) °[address] °[place], [place] °336-275-0206 ° °[facility]      °[address]      °[place], [place] [address]     °336-722-9951      °       °[facility] °[address] °[place], [place] °336-723-1848 °90 day commitment/SA/Application process ° °[facility](men only)     °[address]     °[place], [place]     °336-748-1962       °Check-in at 7pm     °       °[facility] °[address] °[place], [place] [address] °336-248-6684 °Men/Women/Women and Children must be there by 7 pm ° °[facility] °[place], [place] °336-722-8721                ° °

## 2016-05-18 NOTE — ED Provider Notes (Signed)
[facility]-EMERGENCY DEPT Provider Note   CSN: [id number] Arrival date & time: 05/15/16  [id number]     History   Chief Complaint Chief Complaint  Patient presents with  . Insomnia    HPI Jennifer Noble is a 40 y.o. female.  The history is provided by the patient. The history is limited by a language barrier. A language interpreter was used.  Insomnia  This is a chronic problem. The problem occurs daily. The problem has not changed since onset.Pertinent negatives include no chest pain, no abdominal pain, no headaches and no shortness of breath. Nothing aggravates the symptoms. Nothing relieves the symptoms. The treatment provided no relief.     Pt came to the ER with complaint of 11 years of insomnia, unchanged.  She feels anxious and has difficulty sleeping.  4 days ago she started taking celexa.  She takes it one a day in the morning and it does help with her anxiety, but she still has unchanged insomnia.  She recently saw neurology for her complaints.  She has been to her regular doctor for same complaints.  She denies depression, SI, HI, and AVH.  No other complaints.   Past Medical History:  Diagnosis Date  . Anxiety   . Depression   . Dysuria   . Hemorrhoid     Patient Active Problem List   Diagnosis Date Noted  . Headache 05/07/2016  . Depression 08/17/2015  . Insomnia 08/17/2015  . Elevated lipids 04/08/2013  . Family history of early CAD 04/08/2013  . Hemorrhoid   . Dysuria     Past Surgical History:  Procedure Laterality Date  . HEMORRHOID SURGERY      OB History    Gravida Para Term Preterm AB Living   3 3 3  0 0 3   SAB TAB Ectopic Multiple Live Births   0 0 0 0 1       Home Medications    Prior to Admission medications   Medication Sig Start Date End Date Taking? Authorizing Provider  citalopram (CELEXA) 20 MG tablet Take 1 tablet (20 mg total) by mouth daily. 05/08/16   Levert FeinsteinYijun Yan, MD  hydrOXYzine (ATARAX/VISTARIL) 25 MG tablet Take 1 tablet  (25 mg total) by mouth every 6 (six) hours. 05/15/16   Danelle BerryLeisa Alli Jasmer, PA-C    Family History Family History  Problem Relation Age of Onset  . Diabetes Mother   . Heart disease      Social History Social History  Substance Use Topics  . Smoking status: Never Smoker  . Smokeless tobacco: Never Used  . Alcohol use No     Allergies   Review of patient's allergies indicates no known allergies.   Review of Systems Review of Systems  Respiratory: Negative for shortness of breath.   Cardiovascular: Negative for chest pain.  Gastrointestinal: Negative for abdominal pain.  Neurological: Negative for headaches.  Psychiatric/Behavioral: The patient has insomnia.   All other systems reviewed and are negative.    Physical Exam Updated Vital Signs BP 116/75   Pulse 80   Temp 98.1 F (36.7 C) (Oral)   Resp 18   Ht 5\' 4"  (1.626 m)   Wt 68 kg   SpO2 100%   BMI 25.75 kg/m   Physical Exam  Constitutional: She is oriented to person, place, and time. She appears well-developed and well-nourished. No distress.  HENT:  Head: Normocephalic and atraumatic.  Right Ear: External ear normal.  Left Ear: External ear normal.  Nose: Nose normal.  Mouth/Throat: Oropharynx is clear and moist.  Eyes: Conjunctivae and EOM are normal. Pupils are equal, round, and reactive to light. Right eye exhibits no discharge. Left eye exhibits no discharge. No scleral icterus.  Neck: Normal range of motion. No tracheal deviation present.  Cardiovascular: Normal rate and regular rhythm.   Pulmonary/Chest: Effort normal and breath sounds normal. No stridor. No respiratory distress.  Abdominal: She exhibits no distension.  Musculoskeletal: Normal range of motion. She exhibits no edema.  Neurological: She is alert and oriented to person, place, and time. She exhibits normal muscle tone. Coordination normal.  Skin: Skin is warm and dry. No rash noted. She is not diaphoretic. No erythema. No pallor.    Psychiatric: She has a normal mood and affect. Her behavior is normal. Judgment and thought content normal.  Nursing note and vitals reviewed.    ED Treatments / Results  Labs (all labs ordered are listed, but only abnormal results are displayed) Labs Reviewed - No data to display  EKG  EKG Interpretation None       Radiology No results found.  Procedures Procedures (including critical care time)  Medications Ordered in ED Medications - No data to display   Initial Impression / Assessment and Plan / ED Course  I have reviewed the triage vital signs and the nursing notes.  Pertinent labs & imaging results that were available during my care of the patient were reviewed by me and considered in my medical decision making (see chart for details).  Clinical Course   Pt with chronic insomnia, no SI, HI, AVH.  She has seen specialists and tried several medications w/o any reported improvement.  She has not seen psychiatry for her sx, will refer to psychiatry, give vistaril to use as needed.  Pt safe to d/c home, no emergent medical conditions, VSS.  Final Clinical Impressions(s) / ED Diagnoses   Final diagnoses:  Insomnia    New Prescriptions Discharge Medication List as of 05/15/2016  9:01 AM    START taking these medications   Details  hydrOXYzine (ATARAX/VISTARIL) 25 MG tablet Take 1 tablet (25 mg total) by mouth every 6 (six) hours., Starting Wed 05/15/2016, Print         Danelle Berry, PA-C 05/18/16 1610    Alvira Monday, MD 05/21/16 2354

## 2016-08-17 IMAGING — CT CT KNEE*R* W/O CM
3 series · 12 of 33 positions shown, 14 images · non-contrast
Comparison: 08/27/2015 radiographs

CLINICAL DATA: Patient fell at home yesterday with right knee pain
and swelling

EXAM:
CT OF THE RIGHT KNEE WITHOUT CONTRAST
TECHNIQUE: Multidetector CT imaging of the RIGHT knee was performed according
to the standard protocol. Multiplanar CT image reconstructions were
also generated.

[Series 5: lower ext 1.5 st · axial · 0.33mm/px · z∈[+675,+825]mm · 4 of 146 slices shown, 5 images]
[im 23/146  soft-tissue]
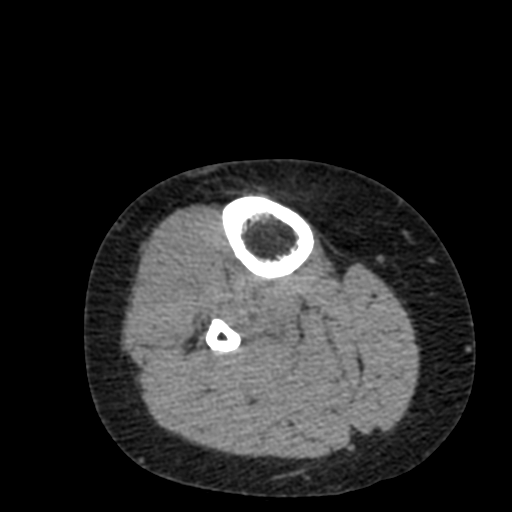
[im 23/146  bone]
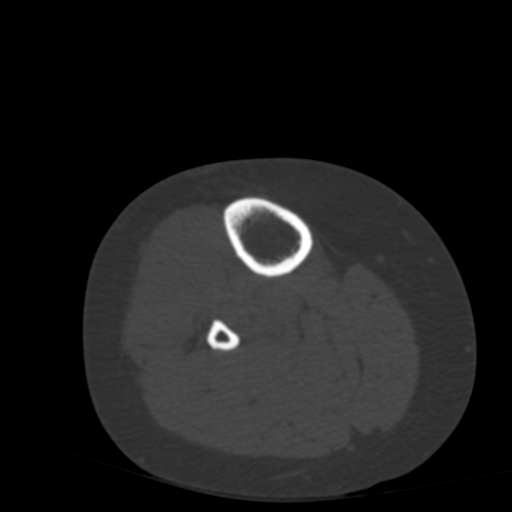
[im 56/146  bone]
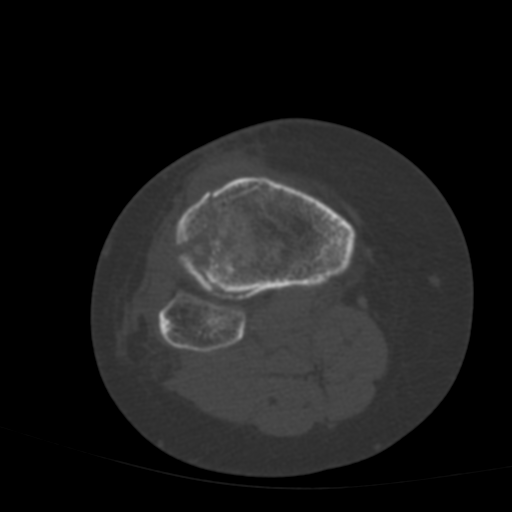
[im 90/146  bone]
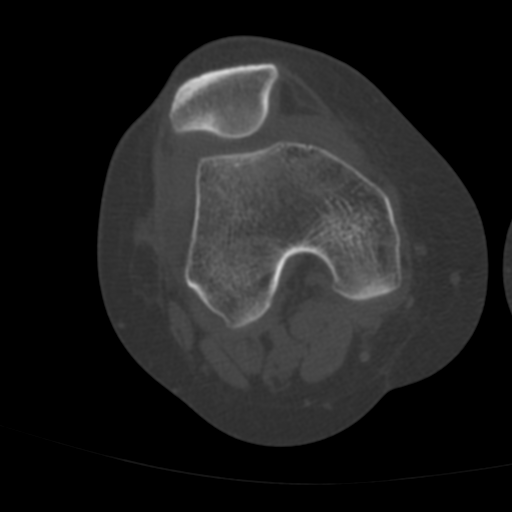
[im 123/146  bone]
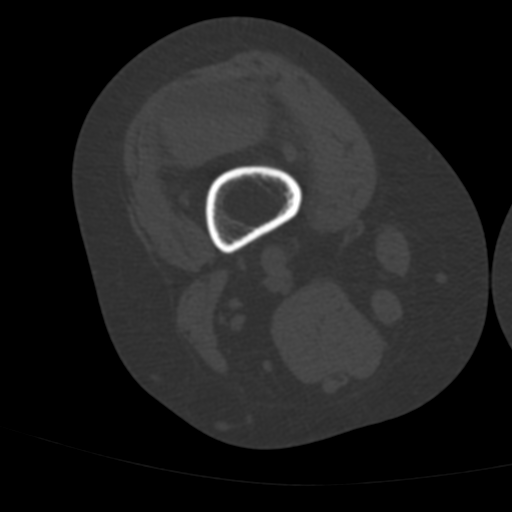

[Series 10: lower ext cor st · coronal · 0.30mm/px · 3 of 99 slices shown]
[im 20/99  bone]
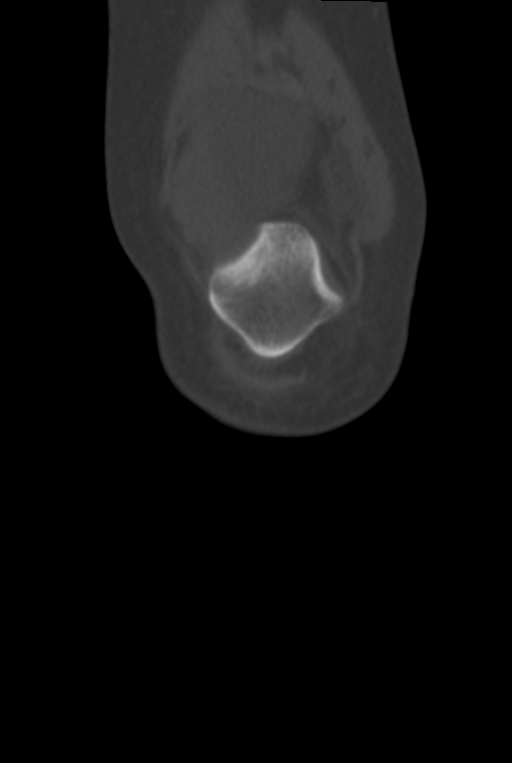
[im 40/99  bone]
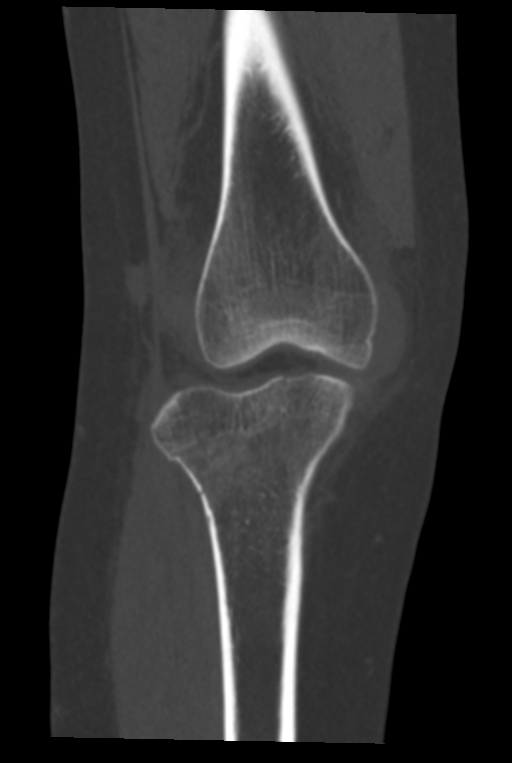
[im 59/99  bone]
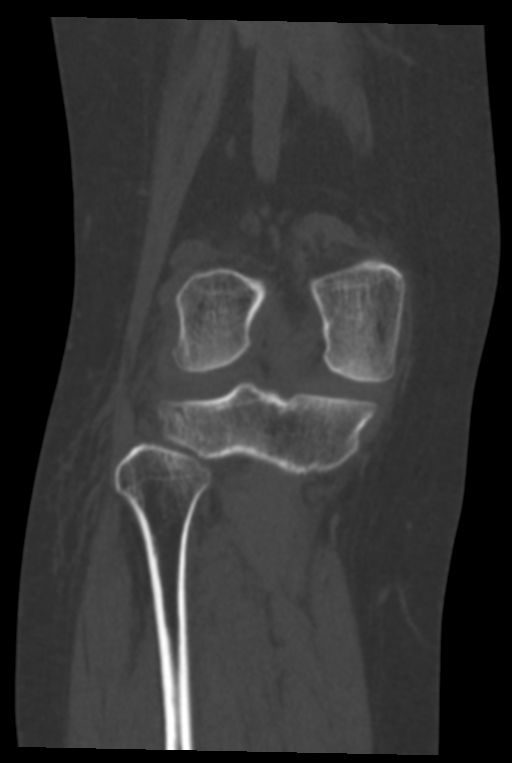

[Series 11: lower ext sag st · sagittal · 0.29mm/px · 5 of 104 slices shown, 6 images]
[im 35/104  bone]
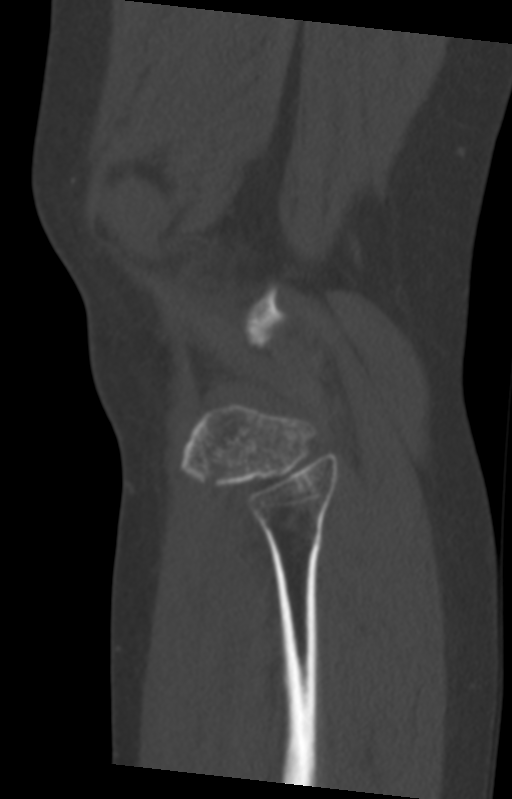
[im 43/104  bone]
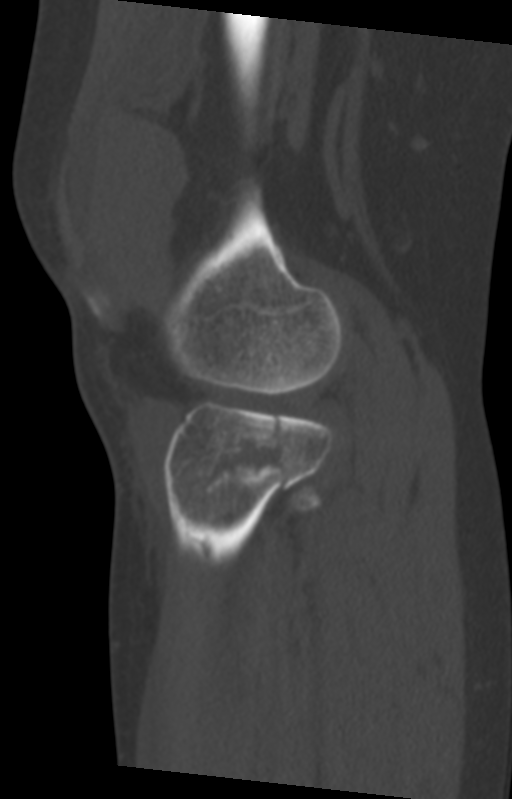
[im 52/104  soft-tissue]
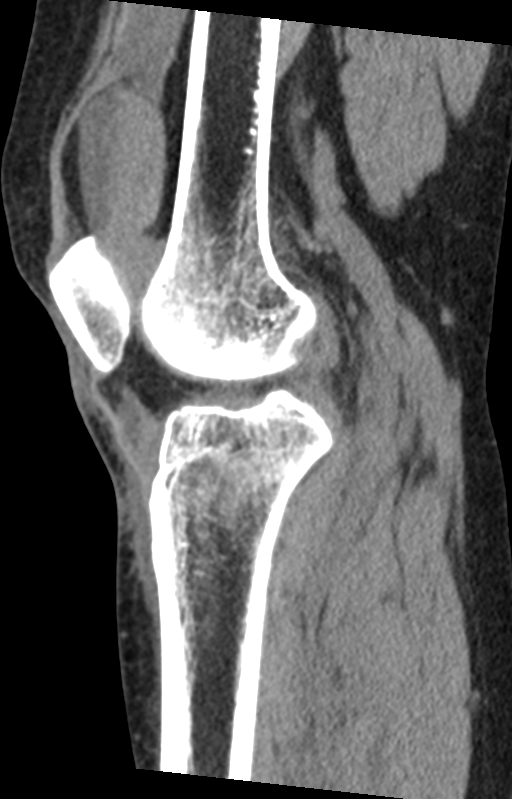
[im 52/104  bone]
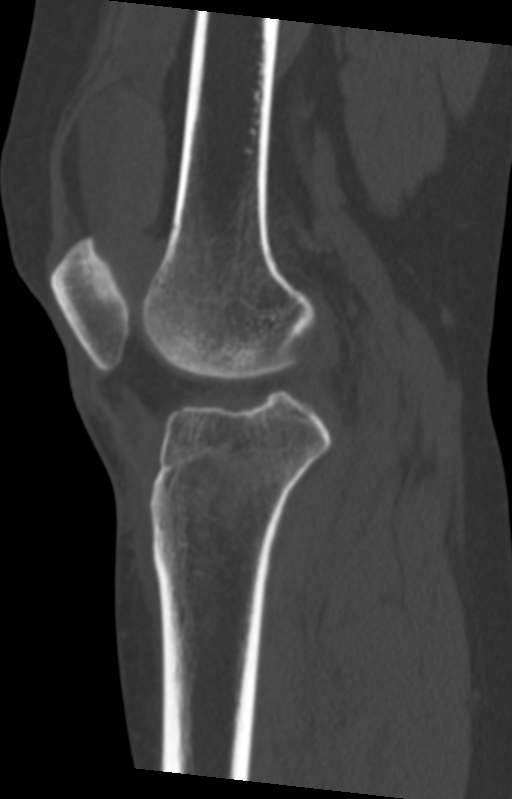
[im 61/104  bone]
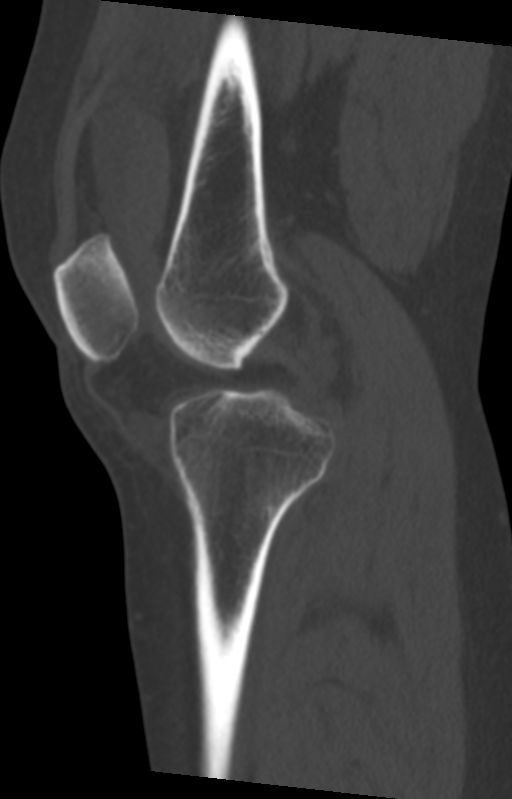
[im 69/104  bone]
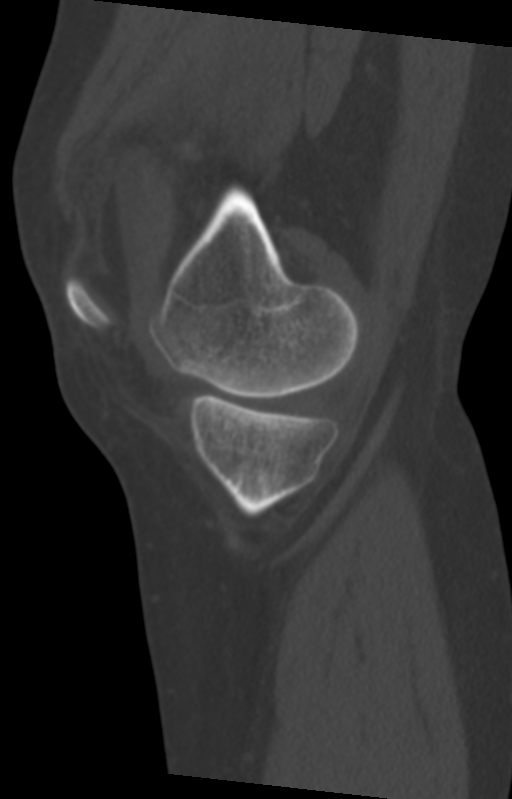

[12 of 33 positions shown; findings below may reference images not displayed]

FINDINGS: There is a moderate joint effusion with fluid fluid level consistent
with lipoma hemarthrosis.

There is a fracture involving the central to lateral tibial plateau.
Fracture fragments are minimally displaced. Fracture line extends
into the lateral tibial metaphysis. No fractures identified
involving the proximal fibula. There is no evidence of fracture
involving the femur or patella.
IMPRESSION: Moderate lipoma hemarthrosis.  Tibial plateau fracture.

## 2016-11-08 ENCOUNTER — Other Ambulatory Visit: Payer: Self-pay | Admitting: Neurology

## 2018-01-28 ENCOUNTER — Other Ambulatory Visit: Payer: Self-pay | Admitting: Nurse Practitioner

## 2018-02-27 ENCOUNTER — Ambulatory Visit: Payer: Self-pay | Attending: Family Medicine | Admitting: *Deleted

## 2018-02-27 DIAGNOSIS — L309 Dermatitis, unspecified: Secondary | ICD-10-CM

## 2018-02-27 NOTE — Progress Notes (Signed)
Itching on feet and back Has taken medication but does not help Jennifer Noble [phone]

## 2018-08-04 ENCOUNTER — Other Ambulatory Visit (HOSPITAL_COMMUNITY): Payer: Self-pay | Admitting: *Deleted

## 2018-08-04 DIAGNOSIS — Z1231 Encounter for screening mammogram for malignant neoplasm of breast: Secondary | ICD-10-CM

## 2018-10-22 ENCOUNTER — Encounter: Payer: Self-pay | Admitting: Obstetrics & Gynecology

## 2018-10-22 ENCOUNTER — Ambulatory Visit
Admission: RE | Admit: 2018-10-22 | Discharge: 2018-10-22 | Disposition: A | Discharge status: Home / Self Care | Payer: No Typology Code available for payment source | Source: Ambulatory Visit | Attending: Obstetrics and Gynecology | Admitting: Obstetrics and Gynecology

## 2018-10-22 ENCOUNTER — Ambulatory Visit (HOSPITAL_COMMUNITY)
Admission: RE | Admit: 2018-10-22 | Discharge: 2018-10-22 | Disposition: A | Discharge status: Home / Self Care | Payer: Self-pay | Source: Ambulatory Visit | Attending: Obstetrics and Gynecology | Admitting: Obstetrics and Gynecology

## 2018-10-22 ENCOUNTER — Encounter (HOSPITAL_COMMUNITY): Payer: Self-pay

## 2018-10-22 VITALS — BP 118/86 | Wt 156.0 lb

## 2018-10-22 DIAGNOSIS — Z1231 Encounter for screening mammogram for malignant neoplasm of breast: Secondary | ICD-10-CM

## 2018-10-22 DIAGNOSIS — Z1239 Encounter for other screening for malignant neoplasm of breast: Secondary | ICD-10-CM

## 2018-10-22 NOTE — Progress Notes (Signed)
Complaints of AUB. Told patient can refer to the Center for [facility] at [facility] for follow-up. Patient stated she has had an ultrasound completed at the [facility] Department. Told patient that BCCCP will not cover the appointment and that she can complete the [facility] Assistance Application. Patient given the application to complete. Referral sent to the Center for [facility] at [facility]. Told patient that someone from the Center for [facility] will call her with the appointment. Patient verbalized understanding.  No breast complaints today.  Pap Smear: Pap smear not completed today. Last Pap smear was in December 2019 at the [facility] Department and normal per patient. Per patient has no history of an abnormal Pap smear. No Pap smear results are in Epic.  Physical exam: Breasts Breasts symmetrical. No skin abnormalities bilateral breasts. No nipple retraction bilateral breasts. No nipple discharge bilateral breasts. No lymphadenopathy. No lumps palpated bilateral breasts. No complaints of pain or tenderness on exam. Referred patient to the [facility] for a screening mammogram. Appointment scheduled for Thursday, October 22, 2018 at 1140.        Pelvic/Bimanual No Pap smear completed today since last Pap smear was in December 2019 per paitent. Pap smear not indicated per BCCCP guidelines.   Smoking History: Patient has never smoked.  Patient Navigation: Patient education provided. Access to services provided for patient through [facility] program. Spanish interpreter provided.   Breast and Cervical Cancer Risk Assessment: Patient has no family history of breast cancer, known genetic mutations, or radiation treatment to the chest before age 14. Patient has no history of cervical dysplasia, immunocompromised, or DES exposure in-utero.  Risk Assessment    Risk Scores      10/22/2018   Last edited by:  Lynnell Dike, LPN   5-year risk: 0.3 %   Lifetime risk: 5.1 %         Used Spanish interpreter Natale Lay from [facility] [place].

## 2018-10-22 NOTE — Patient Instructions (Signed)
Explained breast self awareness with Jennifer Noble. Patient did not need a Pap smear today due to last Pap smear was in December 2019 per patient. Let her know BCCCP will cover Pap smears every 3 years unless has a history of abnormal Pap smears. Referred patient to the [facility] Center of [facility] for a screening mammogram. Appointment scheduled for Thursday, October 22, 2018 at 1140. Patient aware of appointment and will be there. Let patient know the Breast Center will follow up with her within the next couple weeks with results of mammogram by letter or phone. Jennifer Noble verbalized understanding.  Jennifer Noble, Jennifer Maser, RN 1:35 PM

## 2018-11-18 ENCOUNTER — Ambulatory Visit: Payer: No Typology Code available for payment source | Admitting: Obstetrics & Gynecology

## 2018-11-24 ENCOUNTER — Encounter (HOSPITAL_COMMUNITY): Payer: Self-pay | Admitting: *Deleted

## 2018-11-30 ENCOUNTER — Ambulatory Visit (HOSPITAL_COMMUNITY): Payer: Self-pay

## 2018-12-01 ENCOUNTER — Ambulatory Visit (HOSPITAL_COMMUNITY): Payer: Self-pay | Attending: Obstetrics & Gynecology

## 2019-09-28 ENCOUNTER — Other Ambulatory Visit: Payer: Self-pay | Admitting: Physician Assistant

## 2019-10-07 ENCOUNTER — Other Ambulatory Visit: Payer: Self-pay

## 2019-10-07 DIAGNOSIS — Z1231 Encounter for screening mammogram for malignant neoplasm of breast: Secondary | ICD-10-CM

## 2019-11-05 ENCOUNTER — Ambulatory Visit: Payer: Self-pay | Admitting: Family Medicine

## 2019-11-09 ENCOUNTER — Ambulatory Visit: Payer: Self-pay | Admitting: Student

## 2019-11-09 ENCOUNTER — Encounter: Payer: Self-pay | Admitting: Student

## 2019-11-09 ENCOUNTER — Other Ambulatory Visit: Payer: Self-pay

## 2019-11-09 ENCOUNTER — Ambulatory Visit
Admission: RE | Admit: 2019-11-09 | Discharge: 2019-11-09 | Disposition: A | Discharge status: Home / Self Care | Payer: Self-pay | Source: Ambulatory Visit | Attending: Obstetrics and Gynecology | Admitting: Obstetrics and Gynecology

## 2019-11-09 VITALS — BP 138/78 | Temp 97.8°F | Wt 169.0 lb

## 2019-11-09 DIAGNOSIS — Z1239 Encounter for other screening for malignant neoplasm of breast: Secondary | ICD-10-CM

## 2019-11-09 DIAGNOSIS — Z1231 Encounter for screening mammogram for malignant neoplasm of breast: Secondary | ICD-10-CM

## 2019-11-09 NOTE — Progress Notes (Signed)
[name] is a 44 y.o. female who presents to [facility] clinic today with no complaints.    Pap Smear: Pap not smear completed today. Last Pap smear was today at [facility] clinic and was normal. Per patient has no history of an abnormal Pap smear. Last Pap smear result is not available in Epic.   Physical exam: Breasts Breasts symmetrical. No skin abnormalities bilateral breasts. No nipple retraction bilateral breasts. No nipple discharge bilateral breasts. No lymphadenopathy. No lumps palpated bilateral breasts.       Pelvic/Bimanual Pap is not indicated today    Smoking History: Patient has never smoked. Not referred to quit line.    Patient Navigation: Patient education provided. Access to services provided for patient through [facility] program. Spanish interpreter provided.    Colorectal Cancer Screening: Per patient has never had colonoscopy completed No complaints today.    Breast and Cervical Cancer Risk Assessment: Patient does not have family history of breast cancer, known genetic mutations, or radiation treatment to the chest before age 89. Patient does not have history of cervical dysplasia, immunocompromised, or DES exposure in-utero.  Risk Assessment    No risk assessment data for the current encounter   Risk Scores      10/22/2018   Last edited by: Lynnell Dike, LPN   5-year risk: 0.3 %   Lifetime risk: 5.1 %          A: BCCCP exam without pap smear   P: Referred patient to the [facility] Center of [facility] for a screening mammogram. Appointment scheduled later today.  Judeth Horn, NP 11/09/2019 2:09 PM

## 2019-11-17 ENCOUNTER — Ambulatory Visit (HOSPITAL_BASED_OUTPATIENT_CLINIC_OR_DEPARTMENT_OTHER): Payer: Self-pay | Admitting: Nurse Practitioner

## 2019-11-17 ENCOUNTER — Ambulatory Visit: Payer: Self-pay | Attending: Nurse Practitioner

## 2019-11-17 ENCOUNTER — Encounter: Payer: Self-pay | Admitting: Nurse Practitioner

## 2019-11-17 ENCOUNTER — Other Ambulatory Visit: Payer: Self-pay

## 2019-11-17 VITALS — Ht 64.0 in

## 2019-11-17 DIAGNOSIS — Z130 Encounter for screening for diseases of the blood and blood-forming organs and certain disorders involving the immune mechanism: Secondary | ICD-10-CM

## 2019-11-17 DIAGNOSIS — Z7689 Persons encountering health services in other specified circumstances: Secondary | ICD-10-CM

## 2019-11-17 DIAGNOSIS — Z131 Encounter for screening for diabetes mellitus: Secondary | ICD-10-CM

## 2019-11-17 DIAGNOSIS — E785 Hyperlipidemia, unspecified: Secondary | ICD-10-CM

## 2019-11-17 DIAGNOSIS — Z1329 Encounter for screening for other suspected endocrine disorder: Secondary | ICD-10-CM

## 2019-11-17 DIAGNOSIS — Z13228 Encounter for screening for other metabolic disorders: Secondary | ICD-10-CM

## 2019-11-17 NOTE — Progress Notes (Signed)
Virtual Visit via Telephone Note Due to national recommendations of social distancing due to [place] 19, telehealth visit is felt to be most appropriate for this patient at this time.  I discussed the limitations, risks, security and privacy concerns of performing an evaluation and management service by telephone and the availability of in person appointments. I also discussed with the patient that there may be a patient responsible charge related to this service. The patient expressed understanding and agreed to proceed.    I connected with Jennifer Noble on 11/17/19  at   8:50 AM EDT  EDT by telephone and verified that I am speaking with the correct person using two identifiers.   Consent I discussed the limitations, risks, security and privacy concerns of performing an evaluation and management service by telephone and the availability of in person appointments. I also discussed with the patient that there may be a patient responsible charge related to this service. The patient expressed understanding and agreed to proceed.   Location of Patient: Private Residence    Location of Provider: [place] [facility] [place] participating in Telemedicine visit: Geryl Rankins FNP-BC [place] Interpreter ID# Sharion Balloon [id number]   History of Present Illness: Telemedicine visit for: Establish Care She denies any PMH of DM or HTN. Has history   Health Maintenance Mammogram: 11-09-2019 Normal PAP SMEAR- 07-2018 Normal   Dyslipidemia Taking simvastatin 20 mg daily as prescribed. Denies any statin intolerance or myalgias.  Lab Results  Component Value Date   LDLCALC 110 (H) 09/25/2009      Past Medical History:  Diagnosis Date  . Anxiety   . Depression   . Dysuria   . Hemorrhoid   . High cholesterol     Past Surgical History:  Procedure Laterality Date  . HEMORRHOID SURGERY      Family History  Problem  Relation Age of Onset  . Diabetes Mother   . Heart disease Other   . Diabetes Sister   . Diabetes Brother     Social History   Socioeconomic History  . Marital status: Married    Spouse name: Not on file  . Number of children: 3  . Years of education: Not on file  . Highest education level: 6th grade  Occupational History  . Not on file  Tobacco Use  . Smoking status: Never Smoker  . Smokeless tobacco: Never Used  Substance and Sexual Activity  . Alcohol use: No  . Drug use: No  . Sexual activity: Yes    Birth control/protection: None  Other Topics Concern  . Not on file  Social History Narrative  . Not on file   Social Determinants of Health   Financial Resource Strain:   . Difficulty of Paying Living Expenses:   Food Insecurity:   . Worried About Charity fundraiser in the Last Year:   . Arboriculturist in the Last Year:   Transportation Needs: No Transportation Needs  . Lack of Transportation (Medical): No  . Lack of Transportation (Non-Medical): No  Physical Activity:   . Days of Exercise per Week:   . Minutes of Exercise per Session:   Stress:   . Feeling of Stress :   Social Connections:   . Frequency of Communication with Friends and Family:   . Frequency of Social Gatherings with Friends and Family:   . Attends Religious Services:   . Active Member of Clubs or  Organizations:   . Attends Archivist Meetings:   Marland Kitchen Marital Status:      Observations/Objective: Awake, alert and oriented x 3   Review of Systems  Constitutional: Negative for fever, malaise/fatigue and weight loss.  HENT: Negative.  Negative for nosebleeds.   Eyes: Negative.  Negative for blurred vision, double vision and photophobia.  Respiratory: Negative.  Negative for cough and shortness of breath.   Cardiovascular: Negative.  Negative for chest pain, palpitations and leg swelling.  Gastrointestinal: Negative.  Negative for heartburn, nausea and vomiting.  Musculoskeletal:  Negative.  Negative for myalgias.  Neurological: Negative.  Negative for dizziness, focal weakness, seizures and headaches.  Psychiatric/Behavioral: Negative.  Negative for suicidal ideas.    Assessment and Plan: Courtni was seen today for establish care.  Diagnoses and all orders for this visit:  Encounter to establish care  Dyslipidemia, goal LDL below 100 -     Lipid panel INSTRUCTIONS: Work on a low fat, heart healthy diet and participate in regular aerobic exercise program by working out at least 150 minutes per week; 5 days a week-30 minutes per day. Avoid red meat/beef/steak,  fried foods. junk foods, sodas, sugary drinks, unhealthy snacking, alcohol and smoking.  Drink at least 80 oz of water per day and monitor your carbohydrate intake daily.    Screening for metabolic disorder -     MMI19+IFXG  Thyroid disorder screening -     TSH  Screening for deficiency anemia -     CBC  Encounter for screening for diabetes mellitus -     Hemoglobin A1c     Follow Up Instructions Return for office visit after labs.     I discussed the assessment and treatment plan with the patient. The patient was provided an opportunity to ask questions and all were answered. The patient agreed with the plan and demonstrated an understanding of the instructions.   The patient was advised to call back or seek an in-person evaluation if the symptoms worsen or if the condition fails to improve as anticipated.  I provided 14 minutes of non-face-to-face time during this encounter including median intraservice time, reviewing previous notes, labs, imaging, medications and explaining diagnosis and management.  Gildardo Pounds, FNP-BC

## 2019-11-23 ENCOUNTER — Other Ambulatory Visit: Payer: Self-pay

## 2019-11-23 ENCOUNTER — Ambulatory Visit: Payer: Self-pay

## 2019-11-24 LAB — CMP14+EGFR
ALT: 56 IU/L — ABNORMAL HIGH (ref 0–32)
AST: 37 IU/L (ref 0–40)
Albumin/Globulin Ratio: 1.7 (ref 1.2–2.2)
Albumin: 4.5 g/dL (ref 3.8–4.8)
Alkaline Phosphatase: 77 IU/L (ref 39–117)
BUN/Creatinine Ratio: 17 (ref 9–23)
BUN: 8 mg/dL (ref 6–24)
Bilirubin Total: 0.6 mg/dL (ref 0.0–1.2)
CO2: 21 mmol/L (ref 20–29)
Calcium: 9.2 mg/dL (ref 8.7–10.2)
Chloride: 102 mmol/L (ref 96–106)
Creatinine, Ser: 0.48 mg/dL — ABNORMAL LOW (ref 0.57–1.00)
GFR calc Af Amer: 139 mL/min/1.73 (ref >59)
GFR calc non Af Amer: 121 mL/min/1.73 (ref >59)
Globulin, Total: 2.6 g/dL (ref 1.5–4.5)
Glucose: 149 mg/dL — ABNORMAL HIGH (ref 65–99)
Potassium: 4.2 mmol/L (ref 3.5–5.2)
Sodium: 137 mmol/L (ref 134–144)
Total Protein: 7.1 g/dL (ref 6.0–8.5)

## 2019-11-24 LAB — HEMOGLOBIN A1C
Est. average glucose Bld gHb Est-mCnc: 126 mg/dL
Hgb A1c MFr Bld: 6 % — ABNORMAL HIGH (ref 4.8–5.6)

## 2019-11-24 LAB — LIPID PANEL
Chol/HDL Ratio: 3.8 ratio (ref 0.0–4.4)
Cholesterol, Total: 153 mg/dL (ref 100–199)
HDL: 40 mg/dL (ref >39)
LDL Chol Calc (NIH): 80 mg/dL (ref 0–99)
Triglycerides: 194 mg/dL — ABNORMAL HIGH (ref 0–149)
VLDL Cholesterol Cal: 33 mg/dL (ref 5–40)

## 2019-11-24 LAB — CBC
Hematocrit: 36.5 % (ref 34.0–46.6)
Hemoglobin: 12.5 g/dL (ref 11.1–15.9)
MCH: 30.6 pg (ref 26.6–33.0)
MCHC: 34.2 g/dL (ref 31.5–35.7)
MCV: 89 fL (ref 79–97)
Platelets: 298 x10E3/uL (ref 150–450)
RBC: 4.09 x10E6/uL (ref 3.77–5.28)
RDW: 13.2 % (ref 11.7–15.4)
WBC: 7.7 x10E3/uL (ref 3.4–10.8)

## 2019-11-24 LAB — TSH: TSH: 2.79 u[IU]/mL (ref 0.450–4.500)

## 2020-03-02 ENCOUNTER — Other Ambulatory Visit: Payer: Self-pay | Admitting: Nurse Practitioner

## 2020-03-02 NOTE — Telephone Encounter (Signed)
Requested medication (s) are due for refill today: yes  Requested medication (s) are on the active medication list: yes  Last refill: historic provider 05/08/2016  Future visit scheduled: yes  Notes to clinic:  historic provider    Requested Prescriptions  Pending Prescriptions Disp Refills   citalopram (CELEXA) 20 MG tablet 30 tablet 5    Sig: Take 1 tablet (20 mg total) by mouth daily.      Psychiatry:  Antidepressants - SSRI Passed - 03/02/2020  4:46 PM      Passed - Completed PHQ-2 or PHQ-9 in the last 360 days.      Passed - Valid encounter within last 6 months    Recent Outpatient Visits           3 months ago Encounter to establish care   [facility] [place], Shea Stakes, NP       Future Appointments             In 2 weeks Sharon Seller, Marzella Schlein, PA-C [facility]

## 2020-03-02 NOTE — Telephone Encounter (Signed)
Medication Refill - Medication: citalopram (CELEXA) 20 MG tablet    Has the patient contacted their pharmacy?yes (Agent: If no, request that the patient contact the pharmacy for the refill.) (Agent: If yes, when and what did the pharmacy advise?)contact pcp  Preferred Pharmacy (with phone number or street name):  [facility] [address] [place]), [place] - [id number] Phone:  [phone]  Fax:  [phone]       Agent: Please be advised that RX refills may take up to 3 business days. We ask that you follow-up with your pharmacy.

## 2020-03-03 MED ORDER — CITALOPRAM HYDROBROMIDE 20 MG PO TABS: 20.0000 mg | ORAL_TABLET | Freq: Every day | ORAL | 0 refills | Status: DC

## 2020-03-22 ENCOUNTER — Other Ambulatory Visit: Payer: Self-pay

## 2020-03-22 ENCOUNTER — Ambulatory Visit: Payer: Self-pay | Attending: Physician Assistant | Admitting: Physician Assistant

## 2020-03-22 ENCOUNTER — Encounter: Payer: Self-pay | Admitting: Physician Assistant

## 2020-03-22 VITALS — BP 114/72 | HR 78 | Resp 16 | Wt 160.6 lb

## 2020-03-22 DIAGNOSIS — Z789 Other specified health status: Secondary | ICD-10-CM

## 2020-03-22 DIAGNOSIS — H11002 Unspecified pterygium of left eye: Secondary | ICD-10-CM

## 2020-03-22 DIAGNOSIS — I8393 Asymptomatic varicose veins of bilateral lower extremities: Secondary | ICD-10-CM

## 2020-03-22 DIAGNOSIS — S0300XA Dislocation of jaw, unspecified side, initial encounter: Secondary | ICD-10-CM

## 2020-03-22 DIAGNOSIS — H538 Other visual disturbances: Secondary | ICD-10-CM

## 2020-03-22 DIAGNOSIS — Z603 Acculturation difficulty: Secondary | ICD-10-CM

## 2020-03-22 DIAGNOSIS — R7303 Prediabetes: Secondary | ICD-10-CM

## 2020-03-22 LAB — GLUCOSE, POCT (MANUAL RESULT ENTRY): POC Glucose: 105 mg/dL — AB (ref 70–99)

## 2020-03-22 MED ORDER — NAPROXEN 500 MG PO TABS: 500.0000 mg | ORAL_TABLET | Freq: Two times a day (BID) | ORAL | 0 refills | Status: DC

## 2020-03-22 NOTE — Patient Instructions (Signed)
Extirpacin de terigion Pterygium Excision  La extirpacin de terigion es [place] para eliminar un terigion, que es un crecimiento carnoso no canceroso (benigno) en la superficie de adelante del ojo. Un terigion comienza en el tejido transparente externo del ojo (conjuntiva) y se expande hasta el tejido transparente (crnea) que cubre la parte coloreada del ojo (iris). En los casos graves, el terigion crece lo suficiente como para cubrir la zona negra del centro del ojo (pupila). Es posible que necesite esta ciruga si el terigion causa molestias o afecta la visin, y otros tratamientos no funcionan. Otra de las razones para llevar a cabo esta ciruga es mejorar el aspecto del ojo (ciruga esttica). Informe al mdico acerca de lo siguiente:  Cualquier alergia que tenga.  Todos los [employer], incluidos vitaminas, hierbas, gotas oftlmicas, cremas y medicamentos de venta libre.  Cualquier problema que usted o sus familiares hayan tenido con anestsicos.  Cualquier enfermedad de la sangre que tenga.  Cualquier ciruga previa a la que se haya sometido, como la queratectoma fotorrefractiva (photorefractive keratectomy o PRK) o la queratomileusis in situ asistida por lser (laser-assisted in situ keratomileusis o LASIK).  Cualquier afeccin mdica que tenga.  Si est embarazada o podra estarlo. Cules son los riesgos? En general, se trata de un procedimiento seguro. Sin embargo, pueden ocurrir complicaciones, por ejemplo:  Reaparicin del terigion despus de la ciruga.  Dolor en el ojo.  Cambios en la visin, como visin borrosa debido a los cambios en la forma del ojo (astigmatismo).  Sensacin de [employer].  Formacin de cicatrices en el ojo (granuloma). Qu ocurre antes del procedimiento? Mantenerse hidratado Siga las indicaciones del mdico acerca de mantenerse hidratado, las cuales pueden incluir lo siguiente:  Hasta 2horas antes del  procedimiento, puede beber lquidos transparentes, como agua, jugos frutales transparentes, caf negro y t solo. Restricciones en las comidas y bebidas Siga las indicaciones del mdico respecto de las comidas y bebidas, las cuales pueden incluir lo siguiente:  Ocho horas antes del procedimiento, deje de ingerir comidas o alimentos pesados, por ejemplo, carne, alimentos fritos o alimentos grasos.  Seis horas antes del procedimiento, deje de ingerir comidas o alimentos livianos, como tostadas o cereales.  Seis horas antes del procedimiento, deje de beber [place] o bebidas que [employer].  Dos horas antes del procedimiento, deje de beber lquidos transparentes. Instrucciones generales  Consulte al mdico sobre: ? Quarry manager o suspender los medicamentos que toma habitualmente. Esto es muy importante si toma medicamentos para la diabetes o anticoagulantes. ? Tomar medicamentos como aspirina e ibuprofeno. Estos medicamentos pueden tener un efecto anticoagulante en la [place]. No tome estos medicamentos a menos que el mdico se lo indique. ? Tomar medicamentos de [employer], vitaminas, hierbas y suplementos.  Haga que alguien lo lleve a su casa desde el hospital o la clnica.  Pdale a un adulto responsable que lo cuide durante al menos 24horas despus de que le den el alta del hospital o de la [place]. Esto es importante. Qu ocurre durante el procedimiento?  Le colocarn una va intravenosa en una de las venas.  Le administrarn un medicamento para adormecer el ojo (anestesia local). Tambin le administrarn un medicamento para ayudarla a relajarse (sedante).  Le colocarn un dispositivo para mantener el ojo abierto (espculo para prpados).  El terigion se eliminar del ojo.  Pueden colocarle un fragmento de tejido ocular (injerto) en la superficie donde se extirp el terigion. Este injerto puede extraerse de la conjuntiva, en  una zona diferente del globo ocular.  El injerto se podr  Optician, dispensing con pequeos puntos que se disolvern con el tiempo (suturas absorbibles) o con un tipo de pegamento, o ambos.  Se cerrar el ojo y se cubrir con un parche. Este procedimiento puede variar segn el mdico y el hospital. Sander Nephew ocurre despus del procedimiento?  Le controlarn la presin arterial, la frecuencia cardaca, la frecuencia respiratoria y Retail buyer de oxgeno en la sangre hasta que abandone el hospital o la clnica.  Le darn analgsicos si los necesita.  Tendr que usar el parche como se lo haya indicado el mdico.  No conduzca hasta que el mdico lo autorice. Resumen  Un terigion es un crecimiento carnoso no canceroso (benigno) en la superficie de adelante del ojo.  La ciruga de extirpacin puede ser necesaria si el terigion causa molestias o afecta la visin.  Despus de la ciruga se cubrir el ojo con un parche. selo como se lo haya indicado el mdico. Esta informacin no tiene Marine scientist el consejo del mdico. Asegrese de hacerle al mdico cualquier pregunta que tenga. Document Revised: 09/30/2017 Document Reviewed: 09/30/2017 Elsevier Patient Education  [place] articulacin temporomandibular Temporomandibular Joint Syndrome  El sndrome de la articulacin temporomandibular (sndrome de ATM) es una afeccin que causa dolor en las articulaciones temporomandibulares. Estas articulaciones estn ubicadas cerca de las orejas y permiten abrir y Passenger transport manager. Las Development worker, community por el sndrome de ATM pueden tener dificultades o sentir dolor al Engineer, manufacturing systems, morder o Field seismologist otros movimientos con la [place]. El sndrome de ATM suele ser leve y desaparece en unas pocas semanas. En ocasiones, sin embargo, la afeccin se Presenter, broadcasting en un problema a Barrister's clerk (crnico). Cules son las causas? Esta afeccin puede ser causada por lo siguiente:  Rechinar los dientes (bruxismo) o Engineer, maintenance (IT). Algunas  personas lo hacen cuando estn bajo estrs.  Artritis.  Lesin mandibular.  Lesin en la cabeza o el cuello.  Piezas dentales o dentaduras postizas que no estn bien alineadas. En algunos casos, es posible que la causa de este sndrome no se conozca. Cules son los signos o sntomas? El sntoma ms comn de esta afeccin es un dolor continuo en el lado de la cabeza, en la zona de la articulacin temporomandibular. Otros sntomas pueden incluir lo siguiente:  Dolor al [place] [employer], por ejemplo, al Health Net o morder.  Imposibilidad de abrir [employer].  Producir un chasquido al abrir la boca.  Dolor de [place].  Dolor de odos.  Dolor en el cuello o el hombro. Cmo se diagnostica? Esta afeccin se puede diagnosticar en funcin de lo siguiente:  Los sntomas y los antecedentes mdicos.  Un examen fsico. El mdico puede revisar el rango de movimiento de la [place].  Estudios de diagnstico por imgenes, como radiografas o una resonancia magntica (RM). Tal vez deba consultar al dentista, quien determinar si las piezas dentales y la mandbula estn alineadas correctamente. Cmo se trata? El sndrome de ATM suele desaparecer solo. Si es Arts development officer, las opciones pueden incluir lo siguiente:  Consumir alimentos blandos y Midwife hielo o Freight forwarder.  Medicamentos para Best boy o la inflamacin.  Medicamentos o masajes para [employer].  Una frula dental, una placa de mordida o una boquilla para evitar que se rechinen los dientes o se aprieten las [place].  Tcnicas de relajacin o psicoterapia para ayudar a Software engineer.  Tratamiento para  el dolor en el que se aplica corriente elctrica a los nervios a travs de la piel (estimulacin nerviosa elctrica transcutnea).  Acupuntura. A veces, esta opcin ayuda a Best boy.  Ciruga de [place]. Esto debe realizarse en contadas ocasiones. Siga estas  indicaciones en su casa:  Comida y bebida  Consuma una dieta blanda si tiene dificultades para Engineer, manufacturing systems.  No consuma los alimentos que Energy manager. No mastique goma de Higher education careers adviser. Indicaciones generales  Tomar los medicamentos de venta libre y los recetados solamente como se lo haya indicado el mdico.  Si se lo indican, aplique hielo sobre la zona dolorida. ? Ponga el hielo en una bolsa plstica. ? Coloque una [employer] piel y la bolsa de hielo. ? Coloque el hielo durante 43mnutos, 2 a 3veces por da.  Aplique un pao mojado y tibio (compresa tibia) sobre la zona dolorida como se lo hayan indicado.  Dese un masaje en la zona de la mandbula y haga los ejercicios de estiramiento como se lo haya indicado el mdico.  Si le indicaron una frula dental, una placa de mordida o una boquilla, utilcela como se lo haya indicado el mdico.  Concurra a todas las visitas de control como se lo haya indicado el mdico. Esto es importante. Comunquese con un mdico si:  Tiene problemas para comer.  Tiene sntomas nuevos o sus sntomas empeoran. Solicite ayuda de inmediato si:  Se le queda trabada la mandbula abierta o cerrada. Resumen  El sndrome de la articulacin temporomandibular (sndrome de ATM) es una afeccin que causa dolor en las articulaciones temporomandibulares. Estas articulaciones estn ubicadas cerca de las orejas y permiten abrir y cPassenger transport manager  El sndrome de ATM suele ser leve y desaparece en unas pocas semanas. En ocasiones, sin embargo, la afeccin se pPresenter, broadcastingen un problema a lBarrister's clerk(crnico).  Los sntomas incluyen un dolor continuo en el lado de la cabeza, en la zona de la articulacin temporomandibular, dolor al mHealth Neto morder e incapacidad para abrir la mandbula totalmente. Tambin puede producir un chasquido al abrir la boca.  El sndrome de ATM suele desaparecer solo. En caso de nWarden/ranger este puede incluir  medicamentos para aBest boyy reducir la inflamacin o para rScientist, research (life sciences) Tambin pueden utilizarse una frula dental, una placa de mordida o una boquilla para evitar que se rechinen los dientes o se aprieten las mandbulas. Esta informacin no tiene cMarine scientistel consejo del mdico. Asegrese de hacerle al mdico cualquier pregunta que tenga. Document Revised: 11/17/2017 Document Reviewed: 11/17/2017 Elsevier Patient Education  2[place]

## 2020-03-22 NOTE — Progress Notes (Signed)
Jennifer Noble, is a 44 y.o. female  PYK:[id number]  [id number]:[id number]  DOB - [birth year]  Subjective:  Chief Complaint and HPI: Jennifer Noble is a 44 y.o. female here today for multiple issues.  Vision with increased blurriness over the last 3 months.  Esp with reading.    Also c/o varicose veins.  Also L jaw pain for about 1 month.  It makes it hard to open her mouth. No fever.  No specific tooth pain.    Jennifer Noble with [employer] interpreters.   ROS:   Constitutional:  No f/c, No night sweats, No unexplained weight loss. EENT:  No vision changes, + blurry vision, No hearing changes. No mouth, throat, or ear problems.  Respiratory: No cough, No SOB Cardiac: No CP, no palpitations GI:  No abd pain, No N/V/D. GU: No Urinary s/sx Musculoskeletal: No joint pain Neuro: No headache, no dizziness, no motor weakness.  Skin: No rash Endocrine:  No polydipsia. No polyuria.  Psych: Denies SI/HI  No problems updated.  ALLERGIES: No Known Allergies  PAST MEDICAL HISTORY: Past Medical History:  Diagnosis Date  . Anxiety   . Depression   . Dysuria   . Hemorrhoid   . High cholesterol     MEDICATIONS AT HOME: Prior to Admission medications   Medication Sig Start Date End Date Taking? Authorizing Provider  citalopram (CELEXA) 20 MG tablet Take 1 tablet (20 mg total) by mouth daily. 03/03/20   Hoy Register, MD  hydrOXYzine (ATARAX/VISTARIL) 25 MG tablet Take 1 tablet (25 mg total) by mouth every 6 (six) hours. Patient not taking: Reported on 10/22/2018 05/15/16   Danelle Berry, PA-C  naproxen (NAPROSYN) 500 MG tablet Take 1 tablet (500 mg total) by mouth 2 (two) times daily with a meal. X 1 week then prn jaw pain 03/22/20   Georgian Co M, PA-C  simvastatin (ZOCOR) 20 MG tablet Take 20 mg by mouth daily.    [provider]     Objective:  EXAM:   Vitals:   03/22/20 1414  BP: 114/72  Pulse: 78  Resp: 16  SpO2: 97%  Weight: 160 lb 9.6 oz (72.8 kg)     General appearance : A&OX3. NAD. Non-toxic-appearing HEENT: Atraumatic and Normocephalic.  PERRLA. EOM intact.  Fundi benign.  Pterygium B L>R.    TM clear B.  L TMJ is TTP Mouth-MMM, post pharynx WNL w/o erythema, No PND. Neck: supple, no JVD. No cervical lymphadenopathy. No thyromegaly Chest/Lungs:  Breathing-non-labored, Good air entry bilaterally, breath sounds normal without rales, rhonchi, or wheezing  CVS: S1 S2 regular, no murmurs, gallops, rubs  Extremities: Bilateral Lower Ext shows no edema, both legs are warm to touch with = pulse throughout.  Mild LE varicosities w/o any vasculitis Neurology:  CN II-XII grossly intact, Non focal.   Psych:  TP linear. J/I WNL. Normal speech. Appropriate eye contact and affect.  Skin:  No Rash  Data Review Lab Results  Component Value Date   HGBA1C 6.0 (H) 11/23/2019     Assessment & Plan   1. Prediabetes I have had a lengthy discussion and provided education about insulin resistance and the intake of too much sugar/refined carbohydrates.  I have advised the patient to work at a goal of eliminating sugary drinks, candy, desserts, sweets, refined sugars, processed foods, and white carbohydrates.  The patient expresses understanding.   - Glucose (CBG)  2. Blurry vision Likely age related but also complicated by #3 - Ambulatory referral to Ophthalmology  3.  Pterygium of left eye L>R - Ambulatory referral to Ophthalmology  4. Dislocation of temporomandibular joint, initial encounter - Ambulatory referral to Dentistry - naproxen (NAPROSYN) 500 MG tablet; Take 1 tablet (500 mg total) by mouth 2 (two) times daily with a meal. X 1 week then prn jaw pain  Dispense: 60 tablet; Refill: 0  5. Varicose veins of both lower extremities, unspecified whether complicated Gave information/phone # to vein specialist  6. Language barrier AMN interpreters used and additional time performing visit was required.     Patient have been counseled  extensively about nutrition and exercise  Return in about 2 months (around 05/22/2020) for PCP;  chronic conditions.  The patient was given clear instructions to go to ER or return to medical center if symptoms don't improve, worsen or new problems develop. The patient verbalized understanding. The patient was told to call to get lab results if they haven't heard anything in the next week.     Georgian Co, PA-C [facility] [place], [place] [phone]   03/22/2020, 2:43 PM

## 2020-03-24 ENCOUNTER — Telehealth: Payer: Self-pay | Admitting: Nurse Practitioner

## 2020-03-24 NOTE — Telephone Encounter (Signed)
Patient was here on  8/4 and they rx   naproxen (NAPROSYN) 500 MG tablet   And patient is allergic to it and want to know if you can prescribe something else .  Thank You

## 2020-03-24 NOTE — Telephone Encounter (Signed)
Advised patient that only other alternative would be to take Tylenol. Patient verbalized understanding. Dental referral was processed by Cheryll Dessert.   Please advise otherwise.

## 2020-03-28 ENCOUNTER — Other Ambulatory Visit: Payer: Self-pay | Admitting: Nurse Practitioner

## 2021-02-16 ENCOUNTER — Encounter: Payer: Self-pay | Admitting: Nurse Practitioner

## 2021-02-16 ENCOUNTER — Other Ambulatory Visit: Payer: Self-pay

## 2021-02-16 ENCOUNTER — Ambulatory Visit: Payer: Self-pay | Attending: Nurse Practitioner | Admitting: Nurse Practitioner

## 2021-02-16 VITALS — BP 122/79 | HR 65 | Ht 64.0 in | Wt 159.2 lb

## 2021-02-16 DIAGNOSIS — F32A Depression, unspecified: Secondary | ICD-10-CM

## 2021-02-16 DIAGNOSIS — E785 Hyperlipidemia, unspecified: Secondary | ICD-10-CM

## 2021-02-16 DIAGNOSIS — R7303 Prediabetes: Secondary | ICD-10-CM

## 2021-02-16 DIAGNOSIS — Z01818 Encounter for other preprocedural examination: Secondary | ICD-10-CM

## 2021-02-16 DIAGNOSIS — Z3009 Encounter for other general counseling and advice on contraception: Secondary | ICD-10-CM

## 2021-02-16 DIAGNOSIS — Z1159 Encounter for screening for other viral diseases: Secondary | ICD-10-CM

## 2021-02-16 DIAGNOSIS — F419 Anxiety disorder, unspecified: Secondary | ICD-10-CM

## 2021-02-16 DIAGNOSIS — Z0000 Encounter for general adult medical examination without abnormal findings: Secondary | ICD-10-CM

## 2021-02-16 DIAGNOSIS — D72829 Elevated white blood cell count, unspecified: Secondary | ICD-10-CM

## 2021-02-16 MED ORDER — NORGESTIMATE-ETH ESTRADIOL 0.25-35 MG-MCG PO TABS
1.0000 | ORAL_TABLET | Freq: Every day | ORAL | 11 refills | Status: DC
  Filled 2021-02-16: qty 28, 28d supply, fill #0

## 2021-02-16 MED ORDER — CITALOPRAM HYDROBROMIDE 20 MG PO TABS: 10.0000 mg | ORAL_TABLET | Freq: Every day | ORAL | 3 refills | Status: DC

## 2021-02-16 MED ORDER — CITALOPRAM HYDROBROMIDE 20 MG PO TABS
10.0000 mg | ORAL_TABLET | Freq: Every day | ORAL | 3 refills | Status: DC
  Filled 2021-02-16: qty 15, 30d supply, fill #0

## 2021-02-16 MED ORDER — NORGESTIMATE-ETH ESTRADIOL 0.25-35 MG-MCG PO TABS: 1.0000 | ORAL_TABLET | Freq: Every day | ORAL | 11 refills | Status: DC

## 2021-02-16 NOTE — Progress Notes (Signed)
Assessment & Plan:  Jennifer Noble was seen today for annual exam.  Diagnoses and all orders for this visit:  Physical exam  Prediabetes -     Hemoglobin A1c -     CMP14+EGFR  Need for hepatitis C screening test -     HCV Ab w Reflex to Quant PCR  Leukocytosis, unspecified type -     CBC  Dyslipidemia -     Lipid panel INSTRUCTIONS: Work on a low fat, heart healthy diet and participate in regular aerobic exercise program by working out at least 150 minutes per week; 5 days a week-30 minutes per day. Avoid red meat/beef/steak,  fried foods. junk foods, sodas, sugary drinks, unhealthy snacking, alcohol and smoking.  Drink at least 80 oz of water per day and monitor your carbohydrate intake daily.    Tubal ligation evaluation -     Ambulatory referral to Gynecology  Encounter for counseling regarding contraception -     POCT urine pregnancy -     norgestimate-ethinyl estradiol (ORTHO-CYCLEN, 28,) 0.25-35 MG-MCG tablet; Take 1 tablet by mouth daily. -     Interpretation:  Anxiety and depression -     citalopram (CELEXA) 20 MG tablet; Take 0.5 tablets (10 mg total) by mouth daily.   Patient has been counseled on age-appropriate routine health concerns for screening and prevention. These are reviewed and up-to-date. Referrals have been placed accordingly. Immunizations are up-to-date or declined.    Subjective:   Chief Complaint  Patient presents with   Annual Exam   HPI Jennifer Noble 45 y.o. female presents to office today for physical.   She endorses menorrhagia along with metrorrhagia. Last menstrual cycle.  1.5 months ago. Requesting to start OCPs today. She odes not smoke. She is aware risk factors of DVT/PE are increased with obesity, age, smoking and sedentary lifestyle. She had a urine pregnancy test however results are not entered. She will need to repeat.  She would like to be referred to Gynecology for tubal ligation counseling.   The 10-year ASCVD risk  score Mikey Bussing DC Brooke Bonito., et al., 2013) is: 1%   Values used to calculate the score:     Age: 19 years     Sex: Female     Is Non-Hispanic African American: No     Diabetic: No     Tobacco smoker: No     Systolic Blood Pressure: 482 mmHg     Is BP treated: No     HDL Cholesterol: 50 mg/dL     Total Cholesterol: 221 mg/dL   Anxiety and Depression  She has been out of her celexa. She would like to restart today. Denies any thoughts of self harm.   Prediabetes She is currently not taking any oral diabetic medications.  Lab Results  Component Value Date   HGBA1C 5.9 (H) 02/16/2021     Review of Systems  Constitutional:  Negative for fever, malaise/fatigue and weight loss.  HENT: Negative.  Negative for nosebleeds.   Eyes: Negative.  Negative for blurred vision, double vision and photophobia.  Respiratory: Negative.  Negative for cough and shortness of breath.   Cardiovascular: Negative.  Negative for chest pain, palpitations and leg swelling.  Gastrointestinal: Negative.  Negative for heartburn, nausea and vomiting.  Genitourinary: Negative.        SEE HPI  Musculoskeletal: Negative.  Negative for myalgias.  Skin: Negative.   Neurological: Negative.  Negative for dizziness, focal weakness, seizures and headaches.  Endo/Heme/Allergies: Negative.   Psychiatric/Behavioral:  Positive for depression. Negative for suicidal ideas. The patient is nervous/anxious.    Past Medical History:  Diagnosis Date   Anxiety    Depression    Dysuria    Hemorrhoid    High cholesterol     Past Surgical History:  Procedure Laterality Date   HEMORRHOID SURGERY      Family History  Problem Relation Age of Onset   Diabetes Mother    Heart disease Other    Diabetes Sister    Diabetes Brother     Social History Reviewed with no changes to be made today.   Outpatient Medications Prior to Visit  Medication Sig Dispense Refill   citalopram (CELEXA) 20 MG tablet Take 1 tablet (20 mg total) by  mouth daily. 30 tablet 0   hydrOXYzine (ATARAX/VISTARIL) 25 MG tablet Take 1 tablet (25 mg total) by mouth every 6 (six) hours. (Patient not taking: No sig reported) 12 tablet 0   naproxen (NAPROSYN) 500 MG tablet Take 1 tablet (500 mg total) by mouth 2 (two) times daily with a meal. X 1 week then prn jaw pain (Patient not taking: Reported on 02/16/2021) 60 tablet 0   simvastatin (ZOCOR) 20 MG tablet Take 20 mg by mouth daily. (Patient not taking: Reported on 02/16/2021)     No facility-administered medications prior to visit.    Allergies  Allergen Reactions   Naproxen        Objective:    BP 122/79   Pulse 65   Ht '5\' 4"'  (1.626 m)   Wt 159 lb 3.2 oz (72.2 kg)   SpO2 100%   BMI 27.33 kg/m  Wt Readings from Last 3 Encounters:  02/16/21 159 lb 3.2 oz (72.2 kg)  03/22/20 160 lb 9.6 oz (72.8 kg)  11/09/19 169 lb (76.7 kg)    Physical Exam Constitutional:      Appearance: She is well-developed.  HENT:     Head: Normocephalic and atraumatic.     Right Ear: External ear normal.     Left Ear: External ear normal.     Nose: Nose normal.     Mouth/Throat:     Pharynx: No oropharyngeal exudate.  Eyes:     General: No scleral icterus.       Right eye: No discharge.     Conjunctiva/sclera: Conjunctivae normal.     Pupils: Pupils are equal, round, and reactive to light.     Comments: Left eye pytergium  Neck:     Thyroid: No thyromegaly.     Trachea: No tracheal deviation.  Cardiovascular:     Rate and Rhythm: Normal rate and regular rhythm.     Heart sounds: Normal heart sounds. No murmur heard.   No friction rub.  Pulmonary:     Effort: Pulmonary effort is normal. No accessory muscle usage or respiratory distress.     Breath sounds: Normal breath sounds. No decreased breath sounds, wheezing, rhonchi or rales.  Chest:     Chest wall: No tenderness.  Breasts:    Breasts are symmetrical.     Right: No inverted nipple, mass, nipple discharge, skin change or tenderness.      Left: No inverted nipple, mass, nipple discharge, skin change or tenderness.  Abdominal:     General: Bowel sounds are normal. There is no distension.     Palpations: Abdomen is soft. There is no mass.     Tenderness: There is no abdominal tenderness. There is no guarding or rebound.  Musculoskeletal:  General: No tenderness or deformity. Normal range of motion.     Cervical back: Normal range of motion and neck supple.  Lymphadenopathy:     Cervical: No cervical adenopathy.  Skin:    General: Skin is warm and dry.     Findings: No erythema.  Neurological:     Mental Status: She is alert and oriented to person, place, and time.     Cranial Nerves: No cranial nerve deficit.     Coordination: Coordination normal.     Deep Tendon Reflexes: Reflexes are normal and symmetric.  Psychiatric:        Speech: Speech normal.        Behavior: Behavior normal.        Thought Content: Thought content normal.        Judgment: Judgment normal.         Patient has been counseled extensively about nutrition and exercise as well as the importance of adherence with medications and regular follow-up. The patient was given clear instructions to go to ER or return to medical center if symptoms don't improve, worsen or new problems develop. The patient verbalized understanding.   Follow-up: Return in about 3 months (around 05/19/2021) for PAP SMEAR.   Gildardo Pounds, FNP-BC [facility], [place]   02/18/2021, 9:35 PM

## 2021-02-17 LAB — CBC
Hematocrit: 41.1 % (ref 34.0–46.6)
Hemoglobin: 13.2 g/dL (ref 11.1–15.9)
MCH: 29.7 pg (ref 26.6–33.0)
MCHC: 32.1 g/dL (ref 31.5–35.7)
MCV: 92 fL (ref 79–97)
Platelets: 306 x10E3/uL (ref 150–450)
RBC: 4.45 x10E6/uL (ref 3.77–5.28)
RDW: 12.4 % (ref 11.7–15.4)
WBC: 8.9 x10E3/uL (ref 3.4–10.8)

## 2021-02-17 LAB — CMP14+EGFR
ALT: 46 IU/L — ABNORMAL HIGH (ref 0–32)
AST: 28 IU/L (ref 0–40)
Albumin/Globulin Ratio: 1.6 (ref 1.2–2.2)
Albumin: 4.8 g/dL (ref 3.8–4.8)
Alkaline Phosphatase: 72 IU/L (ref 44–121)
BUN/Creatinine Ratio: 19 (ref 9–23)
BUN: 11 mg/dL (ref 6–24)
Bilirubin Total: 0.4 mg/dL (ref 0.0–1.2)
CO2: 22 mmol/L (ref 20–29)
Calcium: 9.8 mg/dL (ref 8.7–10.2)
Chloride: 99 mmol/L (ref 96–106)
Creatinine, Ser: 0.57 mg/dL (ref 0.57–1.00)
Globulin, Total: 3 g/dL (ref 1.5–4.5)
Glucose: 94 mg/dL (ref 65–99)
Potassium: 4.3 mmol/L (ref 3.5–5.2)
Sodium: 138 mmol/L (ref 134–144)
Total Protein: 7.8 g/dL (ref 6.0–8.5)
eGFR: 115 mL/min/1.73 (ref >59)

## 2021-02-17 LAB — LIPID PANEL
Chol/HDL Ratio: 4.4 ratio (ref 0.0–4.4)
Cholesterol, Total: 221 mg/dL — ABNORMAL HIGH (ref 100–199)
HDL: 50 mg/dL (ref >39)
LDL Chol Calc (NIH): 146 mg/dL — ABNORMAL HIGH (ref 0–99)
Triglycerides: 141 mg/dL (ref 0–149)
VLDL Cholesterol Cal: 25 mg/dL (ref 5–40)

## 2021-02-17 LAB — HCV INTERPRETATION

## 2021-02-17 LAB — HEMOGLOBIN A1C
Est. average glucose Bld gHb Est-mCnc: 123 mg/dL
Hgb A1c MFr Bld: 5.9 % — ABNORMAL HIGH (ref 4.8–5.6)

## 2021-02-17 LAB — HCV AB W REFLEX TO QUANT PCR: HCV Ab: 0.2 {s_co_ratio} (ref 0.0–0.9)

## 2021-02-18 ENCOUNTER — Encounter: Payer: Self-pay | Admitting: Nurse Practitioner

## 2021-02-20 LAB — POCT URINE PREGNANCY: Preg Test, Ur: NEGATIVE

## 2021-02-21 ENCOUNTER — Other Ambulatory Visit: Payer: Self-pay | Admitting: *Deleted

## 2021-02-21 DIAGNOSIS — Z1231 Encounter for screening mammogram for malignant neoplasm of breast: Secondary | ICD-10-CM

## 2021-02-23 ENCOUNTER — Other Ambulatory Visit: Payer: Self-pay

## 2021-02-23 ENCOUNTER — Ambulatory Visit: Payer: No Typology Code available for payment source | Attending: Nurse Practitioner

## 2021-03-06 ENCOUNTER — Ambulatory Visit
Admission: RE | Admit: 2021-03-06 | Discharge: 2021-03-06 | Disposition: A | Discharge status: Home / Self Care | Payer: No Typology Code available for payment source | Source: Ambulatory Visit | Attending: Nurse Practitioner | Admitting: Nurse Practitioner

## 2021-03-06 ENCOUNTER — Ambulatory Visit: Payer: Self-pay | Admitting: *Deleted

## 2021-03-06 ENCOUNTER — Other Ambulatory Visit: Payer: Self-pay

## 2021-03-06 VITALS — BP 120/74 | Wt 160.0 lb

## 2021-03-06 DIAGNOSIS — Z1231 Encounter for screening mammogram for malignant neoplasm of breast: Secondary | ICD-10-CM

## 2021-03-06 DIAGNOSIS — Z1239 Encounter for other screening for malignant neoplasm of breast: Secondary | ICD-10-CM

## 2021-03-06 NOTE — Progress Notes (Signed)
[name] is a 45 y.o. female who presents to [facility] clinic today with no complaints.    Pap Smear: Pap smear not completed today. Last Pap smear was 11/09/2019 at the [facility] clinic and was normal with negative HPV. Per patient has no history of an abnormal Pap smear. Last Pap smear result is not available in Epic. Last Pap smear result will be scanned in to Epic.   Physical exam: Breasts Breasts symmetrical. No skin abnormalities bilateral breasts. No nipple retraction bilateral breasts. No nipple discharge bilateral breasts. No lymphadenopathy. No lumps palpated bilateral breasts. Complaints of left outer breast tenderness on exam. Patient stated her menstrual periods have been irregular and that her menstrual period is due to start soon.  MM DIAG BREAST TOMO BILATERAL  Result Date: 03/18/2016 CLINICAL DATA:  Followup right breast probably benign calcifications. EXAM: 2D DIGITAL DIAGNOSTIC BILATERAL MAMMOGRAM WITH CAD AND ADJUNCT TOMO COMPARISON:  Previous exam(s). ACR Breast Density Category c: The breast tissue is heterogeneously dense, which may obscure small masses. FINDINGS: 2D and 3D tomographic images of both breasts were obtained as well as 2D spot magnification views of the right breast. There has been no significant change in the previously described loosely grouped benign-appearing calcifications in the upper-outer quadrant of the right breast since 03/02/2014. No interval findings suspicious for malignancy in either breast. Mammographic images were processed with CAD. IMPRESSION: Stable right breast benign appearing calcifications. The 2 year stability is compatible with a benign process. No evidence of malignancy in either breast. RECOMMENDATION: Bilateral screening mammogram in 1 year. I have discussed the findings and recommendations with the patient. Results were also provided in writing at the conclusion of the visit. If applicable, a  reminder letter will be sent to the patient regarding the next appointment. BI-RADS CATEGORY  2: Benign. Electronically Signed   By: Beckie Salts M.D.   On: 03/18/2016 11:35  MS DIGITAL SCREENING TOMO BILATERAL  Result Date: 11/09/2019 CLINICAL DATA:  Screening. EXAM: DIGITAL SCREENING BILATERAL MAMMOGRAM WITH TOMO AND CAD COMPARISON:  Previous exam(s). ACR Breast Density Category d: The breast tissue is extremely dense, which lowers the sensitivity of mammography FINDINGS: There are no findings suspicious for malignancy. Images were processed with CAD. IMPRESSION: No mammographic evidence of malignancy. A result letter of this screening mammogram will be mailed directly to the patient. RECOMMENDATION: Screening mammogram in one year. (Code:SM-B-01Y) BI-RADS CATEGORY  1: Negative. Electronically Signed   By: Frederico Hamman M.D.   On: 11/09/2019 16:01   MS DIGITAL SCREENING TOMO BILATERAL  Result Date: 10/23/2018 CLINICAL DATA:  Screening. EXAM: DIGITAL SCREENING BILATERAL MAMMOGRAM WITH TOMO AND CAD COMPARISON:  Previous exam(s). ACR Breast Density Category d: The breast tissue is extremely dense, which lowers the sensitivity of mammography FINDINGS: There are no findings suspicious for malignancy. Images were processed with CAD. IMPRESSION: No mammographic evidence of malignancy. A result letter of this screening mammogram will be mailed directly to the patient. RECOMMENDATION: Screening mammogram in one year. (Code:SM-B-01Y) BI-RADS CATEGORY  1: Negative. Electronically Signed   By: Edwin Cap M.D.   On: 10/23/2018 12:21        Pelvic/Bimanual Pap is not indicated today per BCCCP guidelines.   Smoking History: Patient has never smoked.   Patient Navigation: Patient education provided. Access to services provided for patient through [place] program. Spanish interpreter Natale Lay from [facility] provided. Patient complained about feeling depressed. Gave patient resources to [employer] of  the [place] and the  [facility]. Patient given information on local PCP's to establish care. Gave patient the [facility] Application and information for the orange card.   Breast and Cervical Cancer Risk Assessment: Patient does not have family history of breast cancer, known genetic mutations, or radiation treatment to the chest before age 80. Patient does not have history of cervical dysplasia, immunocompromised, or DES exposure in-utero.  Risk Assessment     Risk Scores       03/06/2021 11/09/2019   Last edited by: Narda Rutherford, LPN McGill, Sherie Demetrius Charity, LPN   5-year risk: 0.4 % 0.4 %   Lifetime risk: 5 % 5 %            A: BCCCP exam without pap smear No complaints.  P: Referred patient to the [facility] for a screening mammogram on the mobile unit. Appointment scheduled Tuesday, March 06, 2021 at 1100.  Priscille Heidelberg, RN 03/06/2021 10:31 AM

## 2021-03-06 NOTE — Patient Instructions (Signed)
Explained breast self awareness with Jennifer Noble. Patient did not need a Pap smear today due to last Pap smear and HPV typing was 11/09/2019. Let her know BCCCP will cover Pap smears and HPV typing every 5 years unless has a history of abnormal Pap smears. Referred patient to the [facility] for a screening mammogram on the mobile unit. Appointment scheduled Tuesday, March 06, 2021 at 1100. Patient escorted to the mobile unit following BCCCP appointment for her screening mammogram. Let patient know the Breast Center will follow up with her within the next couple weeks with results of her mammogram by letter or phone. [name] verbalized understanding.  Bently Morath, Kathaleen Maser, RN 10:32 AM

## 2021-06-11 ENCOUNTER — Ambulatory Visit: Payer: No Typology Code available for payment source

## 2022-05-27 ENCOUNTER — Other Ambulatory Visit: Payer: Self-pay | Admitting: Nurse Practitioner

## 2022-05-27 DIAGNOSIS — F32A Depression, unspecified: Secondary | ICD-10-CM

## 2022-05-27 DIAGNOSIS — F419 Anxiety disorder, unspecified: Secondary | ICD-10-CM

## 2022-05-27 NOTE — Telephone Encounter (Signed)
Medication Refill - Medication: citalopram (CELEXA) 20 MG tablet  Pt stated she has about a week left of medication and is requesting her medication be refilled until her upcoming appointment on 07/19/2022 pt declined to make an earlier appointment as her orange card is expired.  Has the patient contacted their pharmacy? No. (Agent: If no, request that the patient contact the pharmacy for the refill. If patient does not wish to contact the   Preferred Pharmacy (with phone number or street name):  [facility] ([address] [place] - [address]  [address] [place] ([place]) [place] [address]  Phone: [phone] Fax: [phone]  Hours: Not open 24 hours   Has the patient been seen for an appointment in the last year OR does the patient have an upcoming appointment? Yes.    Agent: Please be advised that RX refills may take up to 3 business days. We ask that you follow-up with your pharmacy.

## 2022-05-28 NOTE — Telephone Encounter (Signed)
Requested medication (s) are due for refill today: yes, patient request additional refills until she can make ab OV.  Requested medication (s) are on the active medication list: yes Last refill:  02/16/21  Future visit scheduled:no  Notes to clinic:  Unable to refill per protocol, courtesy refill already given, routing for provider approval.      Requested Prescriptions  Pending Prescriptions Disp Refills   citalopram (CELEXA) 20 MG tablet 45 tablet 3    Sig: Take 0.5 tablets (10 mg total) by mouth daily.     Psychiatry:  Antidepressants - SSRI Failed - 05/27/2022 10:45 AM      Failed - Completed PHQ-2 or PHQ-9 in the last 360 days      Failed - Valid encounter within last 6 months    Recent Outpatient Visits           1 year ago Physical exam   [place] Gildardo Pounds, NP   2 years ago Prediabetes   [place], [place]   2 years ago Encounter to establish care   [place], Vernia Buff, NP       Future Appointments             In 1 month Gildardo Pounds, NP [place]

## 2022-06-03 ENCOUNTER — Other Ambulatory Visit: Payer: Self-pay | Admitting: Nurse Practitioner

## 2022-06-03 DIAGNOSIS — F32A Depression, unspecified: Secondary | ICD-10-CM

## 2022-06-03 DIAGNOSIS — F419 Anxiety disorder, unspecified: Secondary | ICD-10-CM

## 2022-06-04 ENCOUNTER — Other Ambulatory Visit: Payer: Self-pay | Admitting: Nurse Practitioner

## 2022-06-04 ENCOUNTER — Other Ambulatory Visit: Payer: Self-pay

## 2022-06-04 ENCOUNTER — Other Ambulatory Visit: Payer: Self-pay | Admitting: Pharmacist

## 2022-06-04 DIAGNOSIS — F419 Anxiety disorder, unspecified: Secondary | ICD-10-CM

## 2022-06-04 DIAGNOSIS — F32A Depression, unspecified: Secondary | ICD-10-CM

## 2022-06-04 MED ORDER — CITALOPRAM HYDROBROMIDE 20 MG PO TABS
10.0000 mg | ORAL_TABLET | Freq: Every day | ORAL | 0 refills | Status: DC
  Filled 2022-06-04: qty 45, 90d supply, fill #0

## 2022-06-04 NOTE — Telephone Encounter (Signed)
Refilled 06/04/2022 #[id number] rf. Requested Prescriptions  Pending Prescriptions Disp Refills  . citalopram (CELEXA) 20 MG tablet 45 tablet 3    Sig: Take 0.5 tablets (10 mg total) by mouth daily.     Psychiatry:  Antidepressants - SSRI Failed - 06/04/2022 10:49 AM      Failed - Completed PHQ-2 or PHQ-9 in the last 360 days      Failed - Valid encounter within last 6 months    Recent Outpatient Visits          1 year ago Physical exam   [place], Vernia Buff, NP   2 years ago Prediabetes   [place], Dionne Bucy, [place]   2 years ago Encounter to establish care   [place], Vernia Buff, NP      Future Appointments            In 1 month Gildardo Pounds, NP [place]

## 2022-06-05 ENCOUNTER — Other Ambulatory Visit: Payer: Self-pay

## 2022-07-19 ENCOUNTER — Encounter: Payer: Self-pay | Admitting: Nurse Practitioner

## 2022-07-19 ENCOUNTER — Ambulatory Visit: Payer: Self-pay | Attending: Nurse Practitioner | Admitting: Nurse Practitioner

## 2022-07-19 ENCOUNTER — Other Ambulatory Visit: Payer: Self-pay

## 2022-07-19 VITALS — BP 128/81 | HR 75 | Ht 64.0 in | Wt 166.2 lb

## 2022-07-19 DIAGNOSIS — R7303 Prediabetes: Secondary | ICD-10-CM

## 2022-07-19 DIAGNOSIS — Z1231 Encounter for screening mammogram for malignant neoplasm of breast: Secondary | ICD-10-CM

## 2022-07-19 DIAGNOSIS — N951 Menopausal and female climacteric states: Secondary | ICD-10-CM

## 2022-07-19 DIAGNOSIS — F32A Depression, unspecified: Secondary | ICD-10-CM

## 2022-07-19 DIAGNOSIS — F419 Anxiety disorder, unspecified: Secondary | ICD-10-CM

## 2022-07-19 DIAGNOSIS — Z23 Encounter for immunization: Secondary | ICD-10-CM

## 2022-07-19 DIAGNOSIS — E785 Hyperlipidemia, unspecified: Secondary | ICD-10-CM

## 2022-07-19 DIAGNOSIS — D72829 Elevated white blood cell count, unspecified: Secondary | ICD-10-CM

## 2022-07-19 DIAGNOSIS — Z1211 Encounter for screening for malignant neoplasm of colon: Secondary | ICD-10-CM

## 2022-07-19 DIAGNOSIS — Z0000 Encounter for general adult medical examination without abnormal findings: Secondary | ICD-10-CM

## 2022-07-19 DIAGNOSIS — Z0001 Encounter for general adult medical examination with abnormal findings: Secondary | ICD-10-CM

## 2022-07-19 MED ORDER — HYDROXYZINE HCL 10 MG PO TABS: 10.0000 mg | ORAL_TABLET | Freq: Three times a day (TID) | ORAL | 3 refills | Status: DC | PRN

## 2022-07-19 MED ORDER — CITALOPRAM HYDROBROMIDE 20 MG PO TABS: 10.0000 mg | ORAL_TABLET | Freq: Every day | ORAL | 1 refills | Status: DC

## 2022-07-19 NOTE — Patient Instructions (Signed)
For SLEEP MELATONIN SLEEPY TIME TEA CHAMOMILLE TEA ASHWAGHANDA TYLENOL PM

## 2022-07-19 NOTE — Progress Notes (Signed)
Assessment & Plan:  Jennifer Noble was seen today for annual exam.  Diagnoses and all orders for this visit:  Encounter for annual physical exam  Anxiety and depression -     hydrOXYzine (ATARAX) 10 MG tablet; Take 1 tablet (10 mg total) by mouth 3 (three) times daily as needed. -     citalopram (CELEXA) 20 MG tablet; Take 0.5 tablets (10 mg total) by mouth daily.  Need for influenza vaccination -     Flu Vaccine MDCK QUAD PF  Prediabetes -     Hemoglobin A1c -     CMP14+ Lab Results  Component Value Date   HGBA1C 5.9 (H) 02/16/2021     Elevated lipids -     Lipid panel INSTRUCTIONS: Work on a low fat, heart healthy diet and participate in regular aerobic exercise program by working out at least 150 minutes per week; 5 days a week-30 minutes per day. Avoid red meat/beef/steak,  fried foods. junk foods, sodas, sugary drinks, unhealthy snacking, alcohol and smoking.  Drink at least 80 oz of water per day and monitor your carbohydrate intake daily.    Leukocytosis, unspecified type -     CBC with Differential  Hot flashes due to menopause -     Ambulatory referral to Gynecology  Colon cancer screening -     Fecal occult blood, imunochemical([facility])  Breast cancer screening by mammogram -     MS DIGITAL SCREENING TOMO BILATERAL; Future  Need for immunization against influenza -     Flu Vaccine QUAD 58mo+IM (Fluarix, Fluzone & Alfiuria Quad PF)    Patient has been counseled on age-appropriate routine health concerns for screening and prevention. These are reviewed and up-to-date. Referrals have been placed accordingly. Immunizations are up-to-date or declined.    Subjective:   Chief Complaint  Patient presents with   Annual Exam   HPI Jennifer Noble 46 y.o. female presents to office today for annual physical exam  VRI was used to communicate directly with patient for the entire encounter including providing detailed patient instructions.     Anxiety and Depression Taking celexa 10 mg daily. Still having breakthrough moments of anxiety. Symptoms include: anxiety and tightness or electricity in chest.    07/19/2022    9:46 AM 02/16/2021   10:38 AM 11/17/2019    9:01 AM  Depression screen PHQ 2/9  Decreased Interest 1 2 0  Down, Depressed, Hopeless 2 2 0  PHQ - 2 Score 3 4 0  Altered sleeping 2 0 1  Tired, decreased energy 2 2 1   Change in appetite 2 0 1  Feeling bad or failure about yourself  2 0 0  Trouble concentrating 2 0 0  Moving slowly or fidgety/restless 0 0 0  Suicidal thoughts 0 0 0  PHQ-9 Score 13 6 3    Having hot flashes several times per day. Would like to discuss possibly taking estrogen for this. She has not had a menstrual cycle in 2 years. Was on OCPs up until July of last year.    Review of Systems  Constitutional:  Negative for fever, malaise/fatigue and weight loss.  HENT: Negative.  Negative for nosebleeds.   Eyes: Negative.  Negative for blurred vision, double vision and photophobia.  Respiratory: Negative.  Negative for cough and shortness of breath.   Cardiovascular: Negative.  Negative for chest pain, palpitations and leg swelling.  Gastrointestinal: Negative.  Negative for heartburn, nausea and vomiting.  Genitourinary: Negative.   Musculoskeletal: Negative.  Negative  for myalgias.  Skin: Negative.   Neurological: Negative.  Negative for dizziness, focal weakness, seizures and headaches.  Endo/Heme/Allergies: Negative.   Psychiatric/Behavioral:  Positive for depression. Negative for suicidal ideas. The patient is nervous/anxious.     Past Medical History:  Diagnosis Date   Anxiety    Depression    Dysuria    Hemorrhoid    High cholesterol     Past Surgical History:  Procedure Laterality Date   HEMORRHOID SURGERY      Family History  Problem Relation Age of Onset   Diabetes Mother    Diabetes Sister    Diabetes Brother    Heart disease Other    Breast cancer Neg Hx      Social History Reviewed with no changes to be made today.   Outpatient Medications Prior to Visit  Medication Sig Dispense Refill   citalopram (CELEXA) 20 MG tablet Take 0.5 tablets (10 mg total) by mouth daily. 45 tablet 0   norgestimate-ethinyl estradiol (ORTHO-CYCLEN, 28,) 0.25-35 MG-MCG tablet Take 1 tablet by mouth daily. (Patient not taking: Reported on 07/19/2022) 28 tablet 11   No facility-administered medications prior to visit.    Allergies  Allergen Reactions   Naproxen        Objective:    BP 128/81   Pulse 75   Ht 5\' 4"  (1.626 m)   Wt 166 lb 3.2 oz (75.4 kg)   LMP 02/27/2021 (Approximate)   SpO2 100%   BMI 28.53 kg/m  Wt Readings from Last 3 Encounters:  07/19/22 166 lb 3.2 oz (75.4 kg)  03/06/21 160 lb (72.6 kg)  02/16/21 159 lb 3.2 oz (72.2 kg)    Physical Exam Constitutional:      Appearance: She is well-developed.  HENT:     Head: Normocephalic and atraumatic.     Right Ear: Hearing, tympanic membrane, ear canal and external ear normal.     Left Ear: Hearing, tympanic membrane, ear canal and external ear normal.     Nose: Nose normal.     Right Turbinates: Not enlarged.     Left Turbinates: Not enlarged.     Mouth/Throat:     Lips: Pink.     Mouth: Mucous membranes are moist.     Dentition: No dental tenderness, gingival swelling, dental abscesses or gum lesions.     Pharynx: No oropharyngeal exudate.  Eyes:     General: No scleral icterus.       Right eye: No discharge.     Extraocular Movements: Extraocular movements intact.     Conjunctiva/sclera: Conjunctivae normal.     Pupils: Pupils are equal, round, and reactive to light.  Neck:     Thyroid: No thyromegaly.     Trachea: No tracheal deviation.  Cardiovascular:     Rate and Rhythm: Normal rate and regular rhythm.     Heart sounds: Normal heart sounds. No murmur heard.    No friction rub.  Pulmonary:     Effort: Pulmonary effort is normal. No accessory muscle usage or respiratory  distress.     Breath sounds: Normal breath sounds. No decreased breath sounds, wheezing, rhonchi or rales.  Abdominal:     General: Bowel sounds are normal. There is no distension.     Palpations: Abdomen is soft. There is no mass.     Tenderness: There is no abdominal tenderness. There is no right CVA tenderness, left CVA tenderness, guarding or rebound.     Hernia: No hernia is present.  Musculoskeletal:  General: No tenderness or deformity. Normal range of motion.     Cervical back: Normal range of motion and neck supple.  Lymphadenopathy:     Cervical: No cervical adenopathy.  Skin:    General: Skin is warm and dry.     Findings: No erythema.  Neurological:     Mental Status: She is alert and oriented to person, place, and time.     Cranial Nerves: No cranial nerve deficit.     Motor: Motor function is intact.     Coordination: Coordination is intact. Coordination normal.     Gait: Gait is intact.     Deep Tendon Reflexes:     Reflex Scores:      Patellar reflexes are 1+ on the right side and 1+ on the left side. Psychiatric:        Attention and Perception: Attention normal.        Mood and Affect: Mood normal.        Speech: Speech normal.        Behavior: Behavior normal.        Thought Content: Thought content normal.        Judgment: Judgment normal.          Patient has been counseled extensively about nutrition and exercise as well as the importance of adherence with medications and regular follow-up. The patient was given clear instructions to go to ER or return to medical center if symptoms don't improve, worsen or new problems develop. The patient verbalized understanding.   Follow-up: Return if symptoms worsen or fail to improve.   Claiborne Rigg, FNP-BC [facility] [place], [place] [phone]   07/19/2022, 7:41 PM

## 2022-07-20 LAB — CMP14+EGFR
ALT: 91 IU/L — ABNORMAL HIGH (ref 0–32)
AST: 55 IU/L — ABNORMAL HIGH (ref 0–40)
Albumin/Globulin Ratio: 1.5 (ref 1.2–2.2)
Albumin: 4.9 g/dL (ref 3.9–4.9)
Alkaline Phosphatase: 120 IU/L (ref 44–121)
BUN/Creatinine Ratio: 21 (ref 9–23)
BUN: 11 mg/dL (ref 6–24)
Bilirubin Total: 0.6 mg/dL (ref 0.0–1.2)
CO2: 23 mmol/L (ref 20–29)
Calcium: 10.2 mg/dL (ref 8.7–10.2)
Chloride: 98 mmol/L (ref 96–106)
Creatinine, Ser: 0.52 mg/dL — ABNORMAL LOW (ref 0.57–1.00)
Globulin, Total: 3.2 g/dL (ref 1.5–4.5)
Glucose: 105 mg/dL — ABNORMAL HIGH (ref 70–99)
Potassium: 4.5 mmol/L (ref 3.5–5.2)
Sodium: 137 mmol/L (ref 134–144)
Total Protein: 8.1 g/dL (ref 6.0–8.5)
eGFR: 117 mL/min/1.73 (ref >59)

## 2022-07-20 LAB — CBC WITH DIFFERENTIAL/PLATELET
Basophils Absolute: 0.1 x10E3/uL (ref 0.0–0.2)
Basos: 1 %
EOS (ABSOLUTE): 0.1 x10E3/uL (ref 0.0–0.4)
Eos: 1 %
Hematocrit: 40.9 % (ref 34.0–46.6)
Hemoglobin: 13.9 g/dL (ref 11.1–15.9)
Immature Grans (Abs): 0 x10E3/uL (ref 0.0–0.1)
Immature Granulocytes: 0 %
Lymphocytes Absolute: 4.2 x10E3/uL — ABNORMAL HIGH (ref 0.7–3.1)
Lymphs: 41 %
MCH: 30.2 pg (ref 26.6–33.0)
MCHC: 34 g/dL (ref 31.5–35.7)
MCV: 89 fL (ref 79–97)
Monocytes Absolute: 0.5 x10E3/uL (ref 0.1–0.9)
Monocytes: 5 %
Neutrophils Absolute: 5.2 x10E3/uL (ref 1.4–7.0)
Neutrophils: 52 %
Platelets: 318 x10E3/uL (ref 150–450)
RBC: 4.6 x10E6/uL (ref 3.77–5.28)
RDW: 12.2 % (ref 11.7–15.4)
WBC: 10.1 x10E3/uL (ref 3.4–10.8)

## 2022-07-20 LAB — LIPID PANEL
Chol/HDL Ratio: 4.3 ratio (ref 0.0–4.4)
Cholesterol, Total: 247 mg/dL — ABNORMAL HIGH (ref 100–199)
HDL: 57 mg/dL (ref >39)
LDL Chol Calc (NIH): 171 mg/dL — ABNORMAL HIGH (ref 0–99)
Triglycerides: 110 mg/dL (ref 0–149)
VLDL Cholesterol Cal: 19 mg/dL (ref 5–40)

## 2022-07-20 LAB — HEMOGLOBIN A1C
Est. average glucose Bld gHb Est-mCnc: 146 mg/dL
Hgb A1c MFr Bld: 6.7 % — ABNORMAL HIGH (ref 4.8–5.6)

## 2022-07-29 ENCOUNTER — Telehealth: Payer: Self-pay

## 2022-07-29 NOTE — Telephone Encounter (Signed)
Pt given lab results per notes of Zelda, NP on 07/29/22. Pt states that she takes Advil for HA often. Recommended trying to cut back on Advil and if having to take on daily basis for HA then calling office back to schedule appt for HA eval. Pt verbalized understanding. Scheduled pt for VV on 08/22/21 @0830  with , PA. Interpreter Angelica # Marylene Land.

## 2022-08-06 ENCOUNTER — Ambulatory Visit: Payer: Self-pay

## 2022-08-08 ENCOUNTER — Ambulatory Visit: Payer: Self-pay | Attending: Family Medicine

## 2022-08-08 ENCOUNTER — Ambulatory Visit: Payer: No Typology Code available for payment source

## 2022-08-20 ENCOUNTER — Other Ambulatory Visit: Payer: Self-pay

## 2022-08-20 DIAGNOSIS — Z1231 Encounter for screening mammogram for malignant neoplasm of breast: Secondary | ICD-10-CM

## 2022-08-22 ENCOUNTER — Ambulatory Visit: Payer: Self-pay | Attending: Physician Assistant | Admitting: Physician Assistant

## 2022-09-19 ENCOUNTER — Ambulatory Visit: Payer: Self-pay | Admitting: Hematology and Oncology

## 2022-09-19 ENCOUNTER — Ambulatory Visit
Admission: RE | Admit: 2022-09-19 | Discharge: 2022-09-19 | Disposition: A | Discharge status: Home / Self Care | Payer: No Typology Code available for payment source | Source: Ambulatory Visit | Attending: Nurse Practitioner | Admitting: Nurse Practitioner

## 2022-09-19 VITALS — BP 126/76 | Wt 157.0 lb

## 2022-09-19 DIAGNOSIS — Z1211 Encounter for screening for malignant neoplasm of colon: Secondary | ICD-10-CM

## 2022-09-19 DIAGNOSIS — Z1231 Encounter for screening mammogram for malignant neoplasm of breast: Secondary | ICD-10-CM

## 2022-09-19 NOTE — Progress Notes (Signed)
[name] is a 47 y.o. female who presents to [facility] clinic today with no complaints.    Pap Smear: Pap not smear completed today. Last Pap smear was 2021 and was normal. Per patient has no history of an abnormal Pap smear. Last Pap smear result is available in Epic.   Physical exam: Breasts Breasts symmetrical. No skin abnormalities bilateral breasts. No nipple retraction bilateral breasts. No nipple discharge bilateral breasts. No lymphadenopathy. No lumps palpated bilateral breasts.   MS DIGITAL SCREENING TOMO BILATERAL  Result Date: 03/08/2021 CLINICAL DATA:  Screening. EXAM: DIGITAL SCREENING BILATERAL MAMMOGRAM WITH TOMOSYNTHESIS AND CAD TECHNIQUE: Bilateral screening digital craniocaudal and mediolateral oblique mammograms were obtained. Bilateral screening digital breast tomosynthesis was performed. The images were evaluated with computer-aided detection. COMPARISON:  Previous exam(s). ACR Breast Density Category d: The breast tissue is extremely dense, which lowers the sensitivity of mammography FINDINGS: There are no findings suspicious for malignancy. IMPRESSION: No mammographic evidence of malignancy. A result letter of this screening mammogram will be mailed directly to the patient. RECOMMENDATION: Screening mammogram in one year. (Code:SM-B-01Y) BI-RADS CATEGORY  1: Negative. Electronically Signed   By: Lajean Manes M.D.   On: 03/08/2021 07:58   MS DIGITAL SCREENING TOMO BILATERAL  Result Date: 11/09/2019 CLINICAL DATA:  Screening. EXAM: DIGITAL SCREENING BILATERAL MAMMOGRAM WITH TOMO AND CAD COMPARISON:  Previous exam(s). ACR Breast Density Category d: The breast tissue is extremely dense, which lowers the sensitivity of mammography FINDINGS: There are no findings suspicious for malignancy. Images were processed with CAD. IMPRESSION: No mammographic evidence of malignancy. A result letter of this screening mammogram will be mailed directly to the patient.  RECOMMENDATION: Screening mammogram in one year. (Code:SM-B-01Y) BI-RADS CATEGORY  1: Negative. Electronically Signed   By: Ammie Ferrier M.D.   On: 11/09/2019 16:01   MS DIGITAL SCREENING TOMO BILATERAL  Result Date: 10/23/2018 CLINICAL DATA:  Screening. EXAM: DIGITAL SCREENING BILATERAL MAMMOGRAM WITH TOMO AND CAD COMPARISON:  Previous exam(s). ACR Breast Density Category d: The breast tissue is extremely dense, which lowers the sensitivity of mammography FINDINGS: There are no findings suspicious for malignancy. Images were processed with CAD. IMPRESSION: No mammographic evidence of malignancy. A result letter of this screening mammogram will be mailed directly to the patient. RECOMMENDATION: Screening mammogram in one year. (Code:SM-B-01Y) BI-RADS CATEGORY  1: Negative. Electronically Signed   By: Everlean Alstrom M.D.   On: 10/23/2018 12:21        Pelvic/Bimanual Pap is not indicated today    Smoking History: Patient has never smoked and was not referred to quit line.    Patient Navigation: Patient education provided. Access to services provided for patient through [place] interpreter provided. No transportation provided   Colorectal Cancer Screening: Per patient has never had colonoscopy completed No complaints today. FIT test given.   Breast and Cervical Cancer Risk Assessment: Patient does not have family history of breast cancer, known genetic mutations, or radiation treatment to the chest before age 47. Patient does not have history of cervical dysplasia, immunocompromised, or DES exposure in-utero.  Risk Assessment   No risk assessment data for the current encounter  Risk Scores       03/06/2021   Last edited by: Demetrius Revel, LPN   5-year risk: 0.4 %   Lifetime risk: 5 %            A: BCCCP exam without pap smear No complaints with benign exam.   P: Referred patient  to the [place] [facility] for a screening mammogram.  Appointment scheduled 09/19/22.  Jennifer Ped, NP 09/19/2022 10:13 AM

## 2022-09-19 NOTE — Patient Instructions (Signed)
Taught Jennifer Noble about breast self awareness and gave educational materials to take home. Patient did not need a Pap smear today due to last Pap smear was in 2021 per patient.  Let her know BCCCP will cover Pap smears every 5 years unless has a history of abnormal Pap smears. Referred patient to the [facility] for screening mammogram. Appointment scheduled for 09/19/22. Patient aware of appointment and will be there. Let patient know will follow up with her within the next couple weeks with results. Amee Aburto Gutierrez verbalized understanding.  Melodye Ped, NP 10:32 AM

## 2022-10-09 LAB — FECAL OCCULT BLOOD, IMMUNOCHEMICAL: Fecal Occult Bld: NEGATIVE

## 2022-11-06 ENCOUNTER — Ambulatory Visit: Payer: Self-pay | Admitting: *Deleted

## 2022-11-06 NOTE — Telephone Encounter (Signed)
Summary: having severe pain on her left leg, which is now radiating to her right.   Pt stated she is okay right now severe pain is mostly at night this is why I sent clinical.  ----- Message from Corlis Hove sent at 11/06/2022  2:25 PM EDT ----- Pt stated that at night she was having severe pain on her left leg, which is now radiating to her right. Stated feels pulsating pain and a pinch sensation. Mentioned it has been going on for about 3 months; however, the pain has only gotten worse.  No appointments available. Please return pt call with Spanish Interpreter.  Pt seeking clinical advice.     Interpreter:Leonal#[id number] Reason for Disposition  [1] MODERATE pain (e.g., interferes with normal activities, limping) AND [2] present > 3 days  Answer Assessment - Initial Assessment Questions 1. ONSET: "When did the pain start?"      1 year- worse 2 months 2. LOCATION: "Where is the pain located?"      Pain with sitting, pain at night with sleeping-left leg-side of leg 3. PAIN: "How bad is the pain?"    (Scale 1-10; or mild, moderate, severe)   -  MILD (1-3): doesn't interfere with normal activities    -  MODERATE (4-7): interferes with normal activities (e.g., work or school) or awakens from sleep, limping    -  SEVERE (8-10): excruciating pain, unable to do any normal activities, unable to walk     8-9, intermittent- sitting laying make worse 4. WORK OR EXERCISE: "Has there been any recent work or exercise that involved this part of the body?"      no 5. CAUSE: "What do you think is causing the leg pain?"     Tendon possible 6. OTHER SYMPTOMS: "Do you have any other symptoms?" (e.g., chest pain, back pain, breathing difficulty, swelling, rash, fever, numbness, weakness)     no  Protocols used: Leg Pain-A-AH

## 2022-11-06 NOTE — Telephone Encounter (Signed)
  Chief Complaint: leg pain- left leg Symptoms: leg pain- outside left leg pain with sitting or laying down. Pain has started to radiate into pelvis and right leg at times Frequency: 1 year- worse last 2 months Pertinent Negatives: Patient denies chest pain, back pain, breathing difficulty, swelling, rash, fever, numbness, weakness Disposition: [] ED /[] Urgent Care (no appt availability in office) / [x] Appointment(In office/virtual)/ []  [facility]/ [] Home Care/ [] Refused Recommended Disposition /[]  Mobile Bus/ []  Follow-up with PCP Additional Notes: Patient prefers to be seen in office- first available appointment was given with instruction to be seen more urgently if symptoms get worse. Patient declines mobile unit.

## 2022-11-06 NOTE — Telephone Encounter (Signed)
Patient had appointment with A.Mc Clung [id number]

## 2022-11-21 ENCOUNTER — Ambulatory Visit: Payer: Self-pay | Attending: Physician Assistant | Admitting: Physician Assistant

## 2022-12-03 ENCOUNTER — Encounter: Payer: Self-pay | Admitting: Family Medicine

## 2022-12-03 ENCOUNTER — Other Ambulatory Visit: Payer: Self-pay

## 2022-12-03 ENCOUNTER — Ambulatory Visit (INDEPENDENT_AMBULATORY_CARE_PROVIDER_SITE_OTHER): Payer: No Typology Code available for payment source | Admitting: Family Medicine

## 2022-12-03 VITALS — BP 120/76 | HR 89 | Ht 63.0 in | Wt 160.0 lb

## 2022-12-03 DIAGNOSIS — N951 Menopausal and female climacteric states: Secondary | ICD-10-CM | POA: Insufficient documentation

## 2022-12-03 DIAGNOSIS — F32A Depression, unspecified: Secondary | ICD-10-CM

## 2022-12-03 DIAGNOSIS — F419 Anxiety disorder, unspecified: Secondary | ICD-10-CM

## 2022-12-03 MED ORDER — PAROXETINE HCL 10 MG PO TABS
10.0000 mg | ORAL_TABLET | Freq: Every day | ORAL | 5 refills | Status: DC
  Filled 2022-12-03: qty 30, 30d supply, fill #0

## 2022-12-03 MED ORDER — CITALOPRAM HYDROBROMIDE 20 MG PO TABS
20.0000 mg | ORAL_TABLET | Freq: Every day | ORAL | 3 refills | Status: DC
  Filled 2022-12-03: qty 90, 90d supply, fill #0
  Filled 2023-08-15: qty 90, 90d supply, fill #1

## 2022-12-03 NOTE — Progress Notes (Signed)
GYNECOLOGY OFFICE VISIT NOTE  History:   Rowan Melina Schools [name] is a 47 y.o. [id number] here today for hot flashes.  Seen by PCP on 07/19/2022, referred for menopausal symptoms Reports no period for 3 years Has had hot flashes for 5 years They were more frequent in the past, have started to slow down now Gest them about three times a week Also having lots of insomnia Doesn't have much energy Does suffer from depression and is taking celexa 10 mg nightly which she feels helps her immensely  Health Maintenance Due  Topic Date Due   COVID-19 Vaccine (1) Never done   DTaP/Tdap/Td (2 - Td or Tdap) 05/26/2021   COLONOSCOPY (Pts 45-29yrs Insurance coverage will need to be confirmed)  Never done    Past Medical History:  Diagnosis Date   Anxiety    Depression    Dysuria    Hemorrhoid    High cholesterol     Past Surgical History:  Procedure Laterality Date   HEMORRHOID SURGERY      The following portions of the patient's history were reviewed and updated as appropriate: allergies, current medications, past family history, past medical history, past social history, past surgical history and problem list.   Health Maintenance:   Last pap: No results found for: "DIAGPAP", "HPV", "HPVHIGH" 11/09/2019 - NILM, neg HPV  Last mammogram:  2 /08/2022 - B[facility] 1   Review of Systems:  Pertinent items noted in HPI and remainder of comprehensive ROS otherwise negative.  Physical Exam:  BP 120/76   Pulse 89   Ht  (1.6 m)   Wt 160 lb (72.6 kg)   LMP 02/27/2021 (Approximate)   BMI 28.34 kg/m  Physical Exam Constitutional:      General: She is not in acute distress.    Appearance: Normal appearance. She is not ill-appearing.  HENT:     Head: Atraumatic.  Eyes:     General: No scleral icterus.    Conjunctiva/sclera: Conjunctivae normal.  Pulmonary:     Effort: Pulmonary effort is normal.  Skin:    General: Skin is warm and dry.     Coloration: Skin is not jaundiced  or pale.  Neurological:     Mental Status: She is alert.     Coordination: Coordination normal.  Psychiatric:        Mood and Affect: Mood normal.        Behavior: Behavior normal.      Labs and Imaging No results found for this or any previous visit (from the past 168 hour(s)). No results found.    Assessment and Plan:   Problem List Items Addressed This Visit       Other   Vasomotor symptoms due to menopause - Primary    Bothersome symptoms. Already on celexa but not maxed out on dose, recommended initially we trial going to 20 mg for a few weeks and see if that reduces her symptoms to a more tolerable level. She is in agreement with this plan, and will call the clinic in two weeks to let me know if there has been any improvement. If there has not been then we will proceed with hormonal therapy. She has some mildly elevated LFT's which are a relative contraindication but 10 year ASCVD risk is 2%, on the whole she is an appropriate candidate.       Other Visit Diagnoses     Anxiety and depression       Relevant Medications   citalopram (  CELEXA) 20 MG tablet       Routine preventative health maintenance measures emphasized. Please refer to After Visit Summary for other counseling recommendations.   Return in about 4 weeks (around 12/31/2022) for gyn f/u on hot flahses.    Total face-to-face time with patient: 25 minutes.  Over 50% of encounter was spent on counseling and coordination of care.   Venora Maples, MD/MPH Attending Family Medicine Physician, [facility] for [facility], [facility]

## 2022-12-03 NOTE — Assessment & Plan Note (Signed)
Bothersome symptoms. Already on celexa but not maxed out on dose, recommended initially we trial going to 20 mg for a few weeks and see if that reduces her symptoms to a more tolerable level. She is in agreement with this plan, and will call the clinic in two weeks to let me know if there has been any improvement. If there has not been then we will proceed with hormonal therapy. She has some mildly elevated LFT's which are a relative contraindication but 10 year ASCVD risk is 2%, on the whole she is an appropriate candidate.

## 2022-12-05 ENCOUNTER — Ambulatory Visit: Payer: Self-pay | Attending: Physician Assistant | Admitting: Physician Assistant

## 2022-12-05 ENCOUNTER — Other Ambulatory Visit: Payer: Self-pay

## 2022-12-05 ENCOUNTER — Encounter: Payer: Self-pay | Admitting: Physician Assistant

## 2022-12-05 VITALS — BP 111/71 | HR 78 | Temp 98.5°F | Ht 63.0 in | Wt 162.0 lb

## 2022-12-05 DIAGNOSIS — M7062 Trochanteric bursitis, left hip: Secondary | ICD-10-CM

## 2022-12-05 DIAGNOSIS — I83813 Varicose veins of bilateral lower extremities with pain: Secondary | ICD-10-CM

## 2022-12-05 DIAGNOSIS — R7303 Prediabetes: Secondary | ICD-10-CM

## 2022-12-05 MED ORDER — MELOXICAM 15 MG PO TABS
15.0000 mg | ORAL_TABLET | Freq: Every day | ORAL | 0 refills | Status: DC
  Filled 2022-12-05: qty 30, 30d supply, fill #0

## 2022-12-05 MED ORDER — METHOCARBAMOL 500 MG PO TABS
500.0000 mg | ORAL_TABLET | Freq: Three times a day (TID) | ORAL | 0 refills | Status: DC
  Filled 2022-12-05: qty 90, 15d supply, fill #0

## 2022-12-05 NOTE — Patient Instructions (Signed)
Bursitis en la cadera Hip Bursitis  La bursitis en la cadera es la hinchazn de uno o ms sacos llenos de lquido (bolsas sinoviales) en la articulacin de la cadera. Si esta se irrita, puede llenarse con lquido adicional e hincharse. Esta afeccin puede causar dolor, y los sntomas pueden aparecer y desaparecer con el tiempo. Cules son las causas? El uso repetido de los msculos de la cadera. Lesin en la cadera. Debilidad en los glteos. Espolones seos. Infeccin. En algunos casos, es posible que la causa se desconozca. Qu incrementa el riesgo? Haber tenido una lesin en la cadera o una ciruga de cadera. Tener una afeccin, como artritis, gota, diabetes o enfermedad de la tiroides. Tener problemas en la columna vertebral. Tener una pierna ms corta que la otra. Correr mucho o hacer carreras de larga distancia. Practicar deportes en los que hay riesgo de lesiones o cadas, como ftbol americano, artes marciales o esqu. Cules son los signos o sntomas? Los sntomas pueden aparecer y desaparecer y, con frecuencia, suelen incluir los siguientes: Dolor en la zona de la cadera o la ingle. El dolor puede empeorar al mover la cadera. Sensibilidad e hinchazn en la cadera. En casos poco frecuentes, la bolsa sinovial puede infectarse. Si esto sucede, podra experimentar lo siguiente: Fiebre. Calor y enrojecimiento en la zona de la cadera. Cmo se trata? Esta afeccin se trata de la siguiente manera: Poner la cadera en reposo. Aplicar hielo en la cadera. Envolver la zona de la cadera con una venda elstica (vendaje de compresin). Mantener la cadera elevada. Otros tratamientos pueden incluir los siguientes: Uso de muletas, bastn o andador. Medicamentos. Drenaje de lquido de la bolsa sinovial. Ciruga para extraer la bolsa sinovial. Esto es poco frecuente. El tratamiento a largo plazo puede incluir lo siguiente: Hacer ejercicios para tomar fuerza y flexibilidad. Cambios en el  estilo de vida, como bajar de peso para disminuir el esfuerzo de la cadera. Siga estas instrucciones en su casa: Control del dolor, la rigidez y la hinchazn     Si se lo indican, aplique hielo sobre la zona dolorida. Para hacer esto: Ponga el hielo en una bolsa plstica. Coloque una toalla entre la piel y la bolsa. Aplique el hielo durante 20 minutos, 2 a 3 veces por da. Retire el hielo si la piel se le pone de color rojo brillante. Esto es muy importante. Si no puede sentir dolor, calor o fro, tiene un mayor riesgo de que se dae la zona. Eleve la cadera; para ello, pngase una almohada debajo de la cadera mientras se encuentra acostado. Detngase si siente dolor. Si se lo indican, aplique calor en la zona afectada. Hgalo con la frecuencia que le haya indicado el mdico. Use la fuente de calor que el mdico le recomiende, como una compresa de calor hmedo o una almohadilla trmica. Coloque una toalla entre la piel y la fuente de calor. Aplique calor durante 20 a 30 minutos. Retire la fuente de calor si la piel se le pone de color rojo brillante. Esto es muy importante. Si no puede sentir dolor, calor o fro, tiene un mayor riesgo de quemarse. Actividad No apoye el peso del cuerpo sobre la cadera, hasta tanto el mdico lo autorice. Use muletas, un bastn o un andador, segn se lo haya indicado el mdico. Si la pierna afectada es la que usa para conducir, pregntele al mdico si es seguro que conduzca. Haga reposo y proteja la cadera tanto como pueda hasta que se sienta mejor. Regrese a sus   actividades normales cuando el mdico le diga que es seguro. Haga los ejercicios como se lo haya indicado el mdico. Instrucciones generales Use los medicamentos de venta libre y los recetados solamente como se lo haya indicado el mdico. Masajee y estire suavemente la zona de la lesin tan frecuentemente como le resulte cmodo. Use vendas elsticas nicamente segn las indicaciones del mdico. Si  tiene una pierna ms corta que la otra, hgase tomar las medidas para una plantilla o un aparato ortopdico. Mantenga un peso saludable. Siga las instrucciones del mdico. Concurra a todas las visitas de seguimiento. Cmo se previene? Haga actividad fsica con regularidad o segn las indicaciones del mdico. Use el calzado adecuado para el deporte que practica y para las actividades diarias. Precaliente y elongue adecuadamente antes de hacer actividad fsica. Reljese y elongue despus de hacer actividad fsica. Tome descansos con frecuencia al realizar actividades repetitivas. Evite las actividades que le causan molestias o dolor en la cadera. Evite estar sentado durante mucho tiempo. Dnde buscar ms informacin American Academy of Orthopaedic Surgeons (Academia Estadoun[employer]anos [employer]dicos): orthoinfo.aaos.org Comunquese con un mdico si: Tiene fiebre. Aparecen nuevos sntomas. Tiene dificultad para caminar o realizar las actividades cotidianas. Tiene un dolor que empeora o que no mejora con los medicamentos. La piel alrededor de la cadera est enrojecida. Tiene sensacin de calor en la zona de la cadera. Solicite ayuda de inmediato si: No puede mover la cadera. Siente mucho dolor. No puede controlar los msculos de los pies. Resumen La bursitis en la cadera es la hinchazn de uno o ms sacos llenos de lquido (bolsas sinoviales) en la articulacin de la cadera. Los sntomas suelen aparecer y desaparecer con el tiempo. Esta afeccin suele tratarse con reposo y aplicacin de hielo en la cadera. Tambin puede ayudar el mantener la zona elevada y envuelta con una venda elstica. Es posible que se necesiten otros tratamientos. Esta informacin no tiene como fin reemplazar el consejo del mdico. Asegrese de hacerle al mdico cualquier pregunta que tenga. Document Revised: 09/06/2021 Document Reviewed: 09/06/2021 Elsevier Patient Education  2023 Elsevier Inc.  

## 2022-12-05 NOTE — Progress Notes (Signed)
Patient ID: Jennifer Noble, female   DOB: 10/01/1975, 47 y.o.   MRN: [id number]     Jennifer Noble, is a 47 y.o. female  [id number]  [id number]:[id number]  DOB - 1976/01/22  Chief Complaint  Patient presents with   Leg Pain    L leg pain x1 yr, pain is recently worsening - pain is worse when laying down.  Swollen R & L ankle.  Med refill - citolopram       Subjective:   Jennifer Noble is a 47 y.o. female here today for  L leg pain that radiates down L leg that has been occurring for about a year and seems to be worse recently.  NKI.  No redness.  No fever.  Worse with activity.  Improves a little with rest  Tylenol or aleve help a little  Also c/o varicose veins causing her pain  She wants to recheck on blood sugars bc she was told this would need meds if did not improve with diet.   No problems updated.  ALLERGIES: Allergies  Allergen Reactions   Naproxen Nausea And Vomiting    PAST MEDICAL HISTORY: Past Medical History:  Diagnosis Date   Anxiety    Depression    Dysuria    Hemorrhoid    High cholesterol     MEDICATIONS AT HOME: Prior to Admission medications   Medication Sig Start Date End Date Taking? Authorizing Provider  citalopram (CELEXA) 20 MG tablet Take 1 tablet (20 mg total) by mouth daily. 12/03/22  Yes Venora Maples, MD  meloxicam (MOBIC) 15 MG tablet Take 1 tablet (15 mg total) by mouth daily. 12/05/22  Yes Georgian Co M, PA-C  methocarbamol (ROBAXIN) 500 MG tablet 1 to 2 tabs every 8 hours prn muscle  pain 12/05/22  Yes Anders Simmonds, PA-C  hydrOXYzine (ATARAX) 10 MG tablet Take 1 tablet (10 mg total) by mouth 3 (three) times daily as needed. Patient not taking: Reported on 09/19/2022 07/19/22   Claiborne Rigg, NP    ROS: Neg HEENT Neg resp Neg cardiac Neg GI Neg GU Neg MS Neg psych Neg neuro  Objective:   Vitals:   12/05/22 1442  BP: 111/71  Pulse: 78  Temp: 98.5 F (36.9 C)  TempSrc: Oral   SpO2: 98%  Weight: 162 lb (73.5 kg)  Height:  (1.6 m)   Exam General appearance : Awake, alert, not in any distress. Speech Clear. Not toxic looking HEENT: Atraumatic and Normocephalic, pupils equally reactive to light and accomodation Neck: Supple, no JVD. No cervical lymphadenopathy.  Chest: Good air entry bilaterally, CTAB.  No rales/rhonchi/wheezing CVS: S1 S2 regular, no murmurs.  L hip vs R examined.  L hip-+TTP over trochanteric bursa that causes pain to radiate down her L leg.  Internal and external rotation are mildly limited by poor flexibility and causes mild pain Extremities: B/L Lower Ext shows no edema, both legs are warm to touch Neurology: Awake alert, and oriented X 3, CN II-XII intact, Non focal Skin: No Rash  Data Review Lab Results  Component Value Date   HGBA1C 6.7 (H) 07/19/2022   HGBA1C 5.9 (H) 02/16/2021   HGBA1C 6.0 (H) 11/23/2019    Assessment & Plan   1. Trochanteric bursitis of left hip - Ambulatory referral to Orthopedic Surgery - meloxicam (MOBIC) 15 MG tablet; Take 1 tablet (15 mg total) by mouth daily.  Dispense: 30 tablet; Refill: 0 - methocarbamol (ROBAXIN) 500 MG tablet; 1 to 2 tabs  every 8 hours prn muscle  pain  Dispense: 90 tablet; Refill: 0  2. Varicose veins of both lower extremities with pain - Ambulatory referral to Vascular Surgery  3. Prediabetes I have had a lengthy discussion and provided education about insulin resistance and the intake of too much sugar/refined carbohydrates.  I have advised the patient to work at a goal of eliminating sugary drinks, candy, desserts, sweets, refined sugars, processed foods, and white carbohydrates.  The patient expresses understanding.  - Hemoglobin A1c   AMN  #[id number] Reuel Boom interpreters used and additional time performing visit was required.   Return in about 6 months (around 06/06/2023), or if symptoms worsen or fail to improve.  The patient was given clear instructions to go to ER or  return to medical center if symptoms don't improve, worsen or new problems develop. The patient verbalized understanding. The patient was told to call to get lab results if they haven't heard anything in the next week.      Georgian Co, PA-C [facility] [place], [place] [phone]   12/05/2022, 3:22 PM

## 2022-12-06 LAB — HEMOGLOBIN A1C
Est. average glucose Bld gHb Est-mCnc: 126 mg/dL
Hgb A1c MFr Bld: 6 % — ABNORMAL HIGH (ref 4.8–5.6)

## 2022-12-11 ENCOUNTER — Ambulatory Visit (INDEPENDENT_AMBULATORY_CARE_PROVIDER_SITE_OTHER): Payer: Self-pay | Admitting: Sports Medicine

## 2022-12-11 ENCOUNTER — Other Ambulatory Visit: Payer: Self-pay

## 2022-12-11 ENCOUNTER — Other Ambulatory Visit (INDEPENDENT_AMBULATORY_CARE_PROVIDER_SITE_OTHER): Payer: Self-pay

## 2022-12-11 ENCOUNTER — Encounter: Payer: Self-pay | Admitting: Sports Medicine

## 2022-12-11 DIAGNOSIS — M25552 Pain in left hip: Secondary | ICD-10-CM

## 2022-12-11 DIAGNOSIS — M5136 Other intervertebral disc degeneration, lumbar region: Secondary | ICD-10-CM

## 2022-12-11 DIAGNOSIS — M51369 Other intervertebral disc degeneration, lumbar region without mention of lumbar back pain or lower extremity pain: Secondary | ICD-10-CM

## 2022-12-11 DIAGNOSIS — G8929 Other chronic pain: Secondary | ICD-10-CM

## 2022-12-11 DIAGNOSIS — M5442 Lumbago with sciatica, left side: Secondary | ICD-10-CM

## 2022-12-11 MED ORDER — METHYLPREDNISOLONE 4 MG PO TBPK
ORAL_TABLET | ORAL | 0 refills | Status: DC
  Filled 2022-12-11 (×2): qty 21, 6d supply, fill #0

## 2022-12-11 NOTE — Progress Notes (Signed)
Jennifer Noble - 47 y.o. female MRN [id number]  Date of birth: 1975/09/05  Office Visit Note: Visit Date: 12/11/2022 PCP: Claiborne Rigg, NP Referred by: Anders Simmonds, PA-C  Subjective: Chief Complaint  Patient presents with   Left Hip - Pain   HPI: Jennifer Noble is a pleasant 47 y.o. female who presents today for left hip pain with lateral leg pain.  The use of an in person Spanish interpreter was used throughout the entirety of this visit today.  She has had left lateral hip pain for about 1 year, no inciting event or injury.  She gets pain with prolonged sitting and with pain laying on that left side.  When the pain flares up it will radiate down the lateral hip into the lateral calf.  She does occasionally have some tingling, denies any numbness or weakness.  Her pain is more of an aching-like pain.  Denies any pain originating from the low back.  She is not taking any medication for this.  Independent review of note from Georgian Co on 12/05/2022.  Did recheck hemoglobin A1c which was 6.0.  Referred here for trochanteric bursitis.  She was started on meloxicam 15 mg.  She is a type-II diabetic, diet-controlled not on any medication currently. A1c was 6.7 back in 07/2022. Lab Results  Component Value Date   HGBA1C 6.0 (H) 12/05/2022   Pertinent ROS were reviewed with the patient and found to be negative unless otherwise specified above in HPI.   Assessment & Plan: Visit Diagnoses:  1. Greater trochanteric pain syndrome of left lower extremity   2. Chronic midline low back pain with left-sided sciatica   3. DDD (degenerative disc disease), lumbar    Plan: Discussed with Arriyana that I do think the majority of her left hip and leg pain is coming from greater trochanteric pain syndrome.  She also has associated weakness of her lateral hip abductors which I think is aggravating her pain.  She does have some mild lumbar DDD more so at the L5-S1  region, although I am less suspicious of this being true radiculopathy.  We discussed all treatment options such as oral medication therapy, formal versus home therapy, injection therapy, advanced imaging.  She is already taking meloxicam 15 mg once daily, I would like her to continue this.  I do not think Robaxin will be very helpful, she may discontinue this if she wishes.  We will start her on methylprednisolone 6-day taper course to help calm down the inflammation.  She would like to get started in formalized physical therapy, a referral was sent today for greater trochanteric pain syndrome and to focus on hip strengthening.  She is in the process of working through obtaining insurance/Medicaid, she may need to wait for this until she gets therapy approval.  I would like to see her back in 6 weeks for reevaluation.  If she is not doing better, we may ascertain a greater trochanteric injection.  Follow-up: Return in about 6 weeks (around 01/22/2023) for f/u for lateral hip (consider GT-inj).   Meds & Orders:  Meds ordered this encounter  Medications   methylPREDNISolone (MEDROL DOSEPAK) 4 MG TBPK tablet    Sig: Take per packet instructions. Taper dosing.    Dispense:  21 each    Refill:  0    Orders Placed This Encounter  Procedures   XR Lumbar Spine Complete   XR HIP UNILAT W OR W/O PELVIS 2-3 VIEWS LEFT   Ambulatory  referral to Physical Therapy     Procedures: No procedures performed      Clinical History: No specialty comments available.  She reports that she has never smoked. She has never used smokeless tobacco.  Recent Labs    07/19/22 1109 12/05/22 1541  HGBA1C 6.7* 6.0*    Objective:   Vital Signs: LMP 02/27/2021 (Approximate)   Physical Exam  Gen: Well-appearing, in no acute distress; non-toxic CV:  Well-perfused. Warm.  Resp: Breathing unlabored on room air; no wheezing. Psych: Fluid speech in conversation; appropriate affect; normal thought process Neuro:  Sensation intact throughout. No gross coordination deficits.   Ortho Exam - Left hip/leg: + TTP over the left greater trochanteric area.  There is associated weakness with lateral abduction and pain with resisted hip abduction.  There is mild weakness of both hips of the left is worse than right with abduction.  No overlying skin changes.  - Lumbar spine: There is no midline spinous process TTP.  No SI joint TTP.  Full range of motion of flexion and extension.  Negative straight leg raise.  Imaging: XR Lumbar Spine Complete  Result Date: 12/11/2022 4 views of the lumbar spine including AP, lateral and flexion and extension views were ordered and reviewed by myself.  X-rays demonstrate no notable scoliosis.  There is mild intervertebral disc space narrowing between L5-S1 with mild DDD.  There is some facet arthropathy at the L5 region, otherwise no acute fracture or other bony abnormality noted.  XR HIP UNILAT W OR W/O PELVIS 2-3 VIEWS LEFT  Result Date: 12/11/2022 2 views of the left hip including AP and lateral film were ordered and reviewed by myself.  X-ray shows a femoral head well-seated within the acetabulum.  There is a well-preserved joint space with only minimal OA.  There is a small spur off the inferior aspect of the acetabulum.   Past Medical/Family/Surgical/Social History: Medications & Allergies reviewed per EMR, new medications updated. Patient Active Problem List   Diagnosis Date Noted   Vasomotor symptoms due to menopause 12/03/2022   Headache 05/07/2016   Depression 08/17/2015   Insomnia 08/17/2015   Elevated lipids 04/08/2013   Family history of early CAD 04/08/2013   Hemorrhoid    Dysuria    Past Medical History:  Diagnosis Date   Anxiety    Depression    Dysuria    Hemorrhoid    High cholesterol    Family History  Problem Relation Age of Onset   Diabetes Mother    Diabetes Sister    Diabetes Brother    Heart disease Other    Breast cancer Neg Hx     Past Surgical History:  Procedure Laterality Date   HEMORRHOID SURGERY     Social History   Occupational History   Not on file  Tobacco Use   Smoking status: Never   Smokeless tobacco: Never  Vaping Use   Vaping Use: Never used  Substance and Sexual Activity   Alcohol use: No   Drug use: No   Sexual activity: Yes    Birth control/protection: Post-menopausal

## 2022-12-11 NOTE — Progress Notes (Signed)
Pain for 1 year Pain is lateral hip but also down the left leg Does state she has tingling in it Denies any medication for pain, and any prior injury or surgery

## 2022-12-31 ENCOUNTER — Ambulatory Visit: Payer: Self-pay | Admitting: Family Medicine

## 2023-01-06 NOTE — Therapy (Unsigned)
OUTPATIENT PHYSICAL THERAPY LOWER EXTREMITY EVALUATION   Patient Name: Jennifer Noble MRN: [id number] DOB:[birth year], 47 y.o., female Today's Date: 01/08/2023  END OF SESSION:  PT End of Session - 01/08/23 1458     Visit Number 1    Number of Visits 8    Date for PT Re-Evaluation 03/05/23    Authorization Type CAFA    PT Start Time 1045    PT Stop Time 1130    PT Time Calculation (min) 45 min    Activity Tolerance Patient tolerated treatment well    Behavior During Therapy [facility] for tasks assessed/performed             Past Medical History:  Diagnosis Date   Anxiety    Depression    Dysuria    Hemorrhoid    High cholesterol    Past Surgical History:  Procedure Laterality Date   HEMORRHOID SURGERY     Patient Active Problem List   Diagnosis Date Noted   Vasomotor symptoms due to menopause 12/03/2022   Headache 05/07/2016   Depression 08/17/2015   Insomnia 08/17/2015   Elevated lipids 04/08/2013   Family history of early CAD 04/08/2013   Hemorrhoid    Dysuria     PCP: Claiborne Rigg, NP   REFERRING PROVIDER: Madelyn Brunner, DO  REFERRING DIAG: [phone] (ICD-10-CM) - Greater trochanteric pain syndrome of left lower extremity  THERAPY DIAG:  Trochanteric bursitis of left hip  Muscle weakness (generalized)  Rationale for Evaluation and Treatment: Rehabilitation  ONSET DATE: chronic  SUBJECTIVE:   SUBJECTIVE STATEMENT: Describes L hip pain ongoing for over 1 year, has tried to manage with medication, dose pack with no relief of symptoms.  Cannot recall a aggravating factor that began her hip pain.   PERTINENT HISTORY: (From MD note)Discussed with Nahla that I do think the majority of her left hip and leg pain is coming from greater trochanteric pain syndrome.  She also has associated weakness of her lateral hip abductors which I think is aggravating her pain.  She does have some mild lumbar DDD more so at the L5-S1 region, although I am  less suspicious of this being true radiculopathy.  We discussed all treatment options such as oral medication therapy, formal versus home therapy, injection therapy, advanced imaging.  She is already taking meloxicam 15 mg once daily, I would like her to continue this.  I do not think Robaxin will be very helpful, she may discontinue this if she wishes.  We will start her on methylprednisolone 6-day taper course to help calm down the inflammation.  She would like to get started in formalized physical therapy, a referral was sent today for greater trochanteric pain syndrome and to focus on hip strengthening.  She is in the process of working through obtaining insurance/Medicaid, she may need to wait for this until she gets therapy approval.  I would like to see her back in 6 weeks for reevaluation.  If she is not doing better, we may ascertain a greater trochanteric injection.  PAIN:  Are you having pain? Yes: NPRS scale: 4/10 Pain location: L hip and lateral thigh Pain description: ache and tingling Aggravating factors: standing from sitting Relieving factors: undetermined  PRECAUTIONS: None  WEIGHT BEARING RESTRICTIONS: No  FALLS:  Has patient fallen in last 6 months? No  OCCUPATION: homemaker  PLOF: Independent  PATIENT GOALS: To reduce and manage my hip pain  NEXT MD VISIT: 01/22/23  OBJECTIVE:   DIAGNOSTIC FINDINGS: XR Lumbar Spine  Complete   Result Date: 12/11/2022 4 views of the lumbar spine including AP, lateral and flexion and extension views were ordered and reviewed by myself.  X-rays demonstrate no notable scoliosis.  There is mild intervertebral disc space narrowing between L5-S1 with mild DDD.  There is some facet arthropathy at the L5 region, otherwise no acute fracture or other bony abnormality noted.   XR HIP UNILAT W OR W/O PELVIS 2-3 VIEWS LEFT   Result Date: 12/11/2022 2 views of the left hip including AP and lateral film were ordered and reviewed by myself.  X-ray  shows a femoral head well-seated within the acetabulum.  There is a well-preserved joint space with only minimal OA.  There is a small spur off the inferior aspect of the acetabulum.   PATIENT SURVEYS:  FOTO 66(74 predicted)  MUSCLE LENGTH: Hamstrings: Right 90 deg; Left 90 deg   POSTURE: No Significant postural limitations  PALPATION: TTP L greater trochanter  LOWER EXTREMITY ROM: WNL throuhout  Passive ROM Right eval Left eval  Hip flexion    Hip extension    Hip abduction    Hip adduction    Hip internal rotation    Hip external rotation    Knee flexion    Knee extension    Ankle dorsiflexion    Ankle plantarflexion    Ankle inversion    Ankle eversion     (Blank rows = not tested)  LOWER EXTREMITY MMT:  MMT Right eval Left eval  Hip flexion 5 4-  Hip extension 5 4-  Hip abduction 5 4-  Hip adduction    Hip internal rotation    Hip external rotation    Knee flexion 5 5  Knee extension 5 5  Ankle dorsiflexion    Ankle plantarflexion    Ankle inversion    Ankle eversion     (Blank rows = not tested)  LOWER EXTREMITY SPECIAL TESTS:  Hip special tests: Luisa Hart (FABER) test: positive , Trendelenburg test: negative, Ober's test: negative, and Piriformis test: negative  FUNCTIONAL TESTS:  5 times sit to stand: 16s arms crossed  GAIT: Distance walked: 26ftx2 Assistive device utilized: None Level of assistance: Complete Independence Comments: unremarkable   TODAY'S TREATMENT:                                                                                                                              DATE: 5//22/24 Eval and HEP    PATIENT EDUCATION:  Education details: Discussed eval findings, rehab rationale and POC and patient is in agreement  Person educated: Patient Education method: Explanation Education comprehension: verbalized understanding and needs further education  HOME EXERCISE PROGRAM: Access Code: [email or web address] URL:  [email or web address] Date: 01/08/2023 Prepared by: Gustavus Bryant  Exercises - Supine Piriformis Stretch with Foot on Ground  - 2 x daily - 5 x weekly - 1 sets - 2 reps - 30s hold - Seated Table Hamstring Stretch  - 2 x daily -  5 x weekly - 1 sets - 2 reps - 30s hold - Clamshell  - 2 x daily - 5 x weekly - 1 sets - 15 reps  ASSESSMENT:  CLINICAL IMPRESSION: Patient is a 47 y.o. female who was seen today for physical therapy evaluation and treatment for chronic L hip and lateral leg symptoms. Patient is TTP over greater trochanter and notes pain with ITB stretch.  Mild weakness in L hip abduction noted.  Some guarding noted throughout special testing.  Nerve tension signs negative.  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    OBJECTIVE IMPAIRMENTS: decreased activity tolerance, decreased knowledge of condition, decreased ROM, decreased strength, and pain.   ACTIVITY LIMITATIONS: sitting, standing, squatting, sleeping, and stairs  PERSONAL FACTORS: Fitness, Past/current experiences, Time since onset of injury/illness/exacerbation, and language barrier  are also affecting patient's functional outcome.   REHAB POTENTIAL: Good  CLINICAL DECISION MAKING: Stable/uncomplicated  EVALUATION COMPLEXITY: Low   GOALS: Goals reviewed with patient? No  SHORT TERM GOALS=LONG TERM GOALS: Target date: 02/05/2023   Patient to demonstrate independence in HEP  Baseline: [id number] Goal status: INITIAL  2.  Decrease pain to 2/10 Baseline: 4/10 Goal status: INITIAL  3.  Increase L hip strength to 4/5 Baseline: 4-/5 Goal status: INITIAL  4.  Increase FOTO score to 74 Baseline: 66 Goal status: INITIAL  5.  Decrease 5x STS time to <15s arms crossed Baseline: 16s arms crossed Goal status: INITIAL   PLAN:  PT FREQUENCY: 1-2x/week  PT DURATION: 4 weeks  PLANNED INTERVENTIONS: Therapeutic exercises, Therapeutic activity,  Neuromuscular re-education, Balance training, Gait training, Patient/Family education, Self Care, Joint mobilization, Stair training, Dry Needling, Manual therapy, and Re-evaluation  PLAN FOR NEXT SESSION: HEP review and update, manual techniques as appropriate, aerobic tasks, ROM and flexibility activities, strengthening and PREs, TPDN, gait and balance training as needed     Hildred Laser, PT 01/08/2023, 3:01 PM

## 2023-01-08 ENCOUNTER — Other Ambulatory Visit: Payer: Self-pay

## 2023-01-08 ENCOUNTER — Ambulatory Visit: Payer: Self-pay | Attending: Sports Medicine

## 2023-01-08 DIAGNOSIS — M25552 Pain in left hip: Secondary | ICD-10-CM | POA: Insufficient documentation

## 2023-01-08 DIAGNOSIS — M6281 Muscle weakness (generalized): Secondary | ICD-10-CM | POA: Insufficient documentation

## 2023-01-08 DIAGNOSIS — M7062 Trochanteric bursitis, left hip: Secondary | ICD-10-CM | POA: Insufficient documentation

## 2023-01-08 NOTE — Addendum Note (Signed)
Addended by: Hildred Laser on: 01/08/2023 03:09 PM   Modules accepted: Orders

## 2023-01-09 ENCOUNTER — Telehealth: Payer: Self-pay | Admitting: Nurse Practitioner

## 2023-01-09 ENCOUNTER — Other Ambulatory Visit: Payer: Self-pay | Admitting: *Deleted

## 2023-01-09 DIAGNOSIS — I8393 Asymptomatic varicose veins of bilateral lower extremities: Secondary | ICD-10-CM

## 2023-01-09 NOTE — Telephone Encounter (Signed)
Pt is calling to schedule an appt to renew her orange card. Please advise CB- [phone]

## 2023-01-10 NOTE — Telephone Encounter (Signed)
fyi

## 2023-01-15 NOTE — Therapy (Signed)
OUTPATIENT PHYSICAL THERAPY TREATMENT NOTE   Patient Name: Jennifer Noble MRN: [id number] DOB:20-Mar-1976, 47 y.o., female Today's Date: 01/16/2023  END OF SESSION:  PT End of Session - 01/16/23 0859     Visit Number 2    Number of Visits 8    Date for PT Re-Evaluation 03/05/23    Authorization Type CAFA    PT Start Time 0910    PT Stop Time 0948    PT Time Calculation (min) 38 min    Activity Tolerance Patient tolerated treatment well    Behavior During Therapy [facility] for tasks assessed/performed              Past Medical History:  Diagnosis Date   Anxiety    Depression    Dysuria    Hemorrhoid    High cholesterol    Past Surgical History:  Procedure Laterality Date   HEMORRHOID SURGERY     Patient Active Problem List   Diagnosis Date Noted   Vasomotor symptoms due to menopause 12/03/2022   Headache 05/07/2016   Depression 08/17/2015   Insomnia 08/17/2015   Elevated lipids 04/08/2013   Family history of early CAD 04/08/2013   Hemorrhoid    Dysuria     PCP: Claiborne Rigg, NP   REFERRING PROVIDER: Madelyn Brunner, DO  REFERRING DIAG: [phone] (ICD-10-CM) - Greater trochanteric pain syndrome of left lower extremity  THERAPY DIAG:  Trochanteric bursitis of left hip  Muscle weakness (generalized)  Rationale for Evaluation and Treatment: Rehabilitation  ONSET DATE: chronic  SUBJECTIVE:   SUBJECTIVE STATEMENT: Patient reports that she has some pain when performing HEP  PERTINENT HISTORY: (From MD note)Discussed with Nala that I do think the majority of her left hip and leg pain is coming from greater trochanteric pain syndrome.  She also has associated weakness of her lateral hip abductors which I think is aggravating her pain.  She does have some mild lumbar DDD more so at the L5-S1 region, although I am less suspicious of this being true radiculopathy.  We discussed all treatment options such as oral medication therapy, formal versus home  therapy, injection therapy, advanced imaging.  She is already taking meloxicam 15 mg once daily, I would like her to continue this.  I do not think Robaxin will be very helpful, she may discontinue this if she wishes.  We will start her on methylprednisolone 6-day taper course to help calm down the inflammation.  She would like to get started in formalized physical therapy, a referral was sent today for greater trochanteric pain syndrome and to focus on hip strengthening.  She is in the process of working through obtaining insurance/Medicaid, she may need to wait for this until she gets therapy approval.  I would like to see her back in 6 weeks for reevaluation.  If she is not doing better, we may ascertain a greater trochanteric injection.  PAIN:  Are you having pain? Yes: NPRS scale: 7/10 Pain location: L hip and lateral thigh Pain description: ache and tingling Aggravating factors: standing from sitting Relieving factors: undetermined  PRECAUTIONS: None  WEIGHT BEARING RESTRICTIONS: No  FALLS:  Has patient fallen in last 6 months? No  OCCUPATION: homemaker  PLOF: Independent  PATIENT GOALS: To reduce and manage my hip pain  NEXT MD VISIT: 01/22/23  OBJECTIVE:   DIAGNOSTIC FINDINGS: XR Lumbar Spine Complete   Result Date: 12/11/2022 4 views of the lumbar spine including AP, lateral and flexion and extension views were ordered and reviewed  by myself.  X-rays demonstrate no notable scoliosis.  There is mild intervertebral disc space narrowing between L5-S1 with mild DDD.  There is some facet arthropathy at the L5 region, otherwise no acute fracture or other bony abnormality noted.   XR HIP UNILAT W OR W/O PELVIS 2-3 VIEWS LEFT   Result Date: 12/11/2022 2 views of the left hip including AP and lateral film were ordered and reviewed by myself.  X-ray shows a femoral head well-seated within the acetabulum.  There is a well-preserved joint space with only minimal OA.  There is a small spur  off the inferior aspect of the acetabulum.   PATIENT SURVEYS:  FOTO 66(74 predicted)  MUSCLE LENGTH: Hamstrings: Right 90 deg; Left 90 deg   POSTURE: No Significant postural limitations  PALPATION: TTP L greater trochanter  LOWER EXTREMITY ROM: WNL throuhout  Passive ROM Right eval Left eval  Hip flexion    Hip extension    Hip abduction    Hip adduction    Hip internal rotation    Hip external rotation    Knee flexion    Knee extension    Ankle dorsiflexion    Ankle plantarflexion    Ankle inversion    Ankle eversion     (Blank rows = not tested)  LOWER EXTREMITY MMT:  MMT Right eval Left eval  Hip flexion 5 4-  Hip extension 5 4-  Hip abduction 5 4-  Hip adduction    Hip internal rotation    Hip external rotation    Knee flexion 5 5  Knee extension 5 5  Ankle dorsiflexion    Ankle plantarflexion    Ankle inversion    Ankle eversion     (Blank rows = not tested)  LOWER EXTREMITY SPECIAL TESTS:  Hip special tests: Luisa Hart (FABER) test: positive , Trendelenburg test: negative, Ober's test: negative, and Piriformis test: negative  FUNCTIONAL TESTS:  5 times sit to stand: 16s arms crossed  GAIT: Distance walked: 18ftx2 Assistive device utilized: None Level of assistance: Complete Independence Comments: unremarkable   TODAY'S TREATMENT:    [facility] Adult PT Treatment:                                                DATE: 01/16/23 Therapeutic Exercise: Nustep level 5 x 5 mins Standing hip abduction/extension 2x10 each BIL Sidestepping at counter RTB at ankles x 3 laps Step up with contralateral march 6" x10 BIL, with 5# KB x10 BIL Seated hamstring stretch 2x30" BIL Bridges with ball 2x10 Seated figure 4 piriformis stretch x30" BIL push/pull Supine figure 4 piriformis stretch 2x30" BIL Supine ITB stretch with strap 2x30" Lt Supine QL stretch 2x30" BIL  DATE: 5//22/24 Eval and HEP    PATIENT EDUCATION:  Education details: Discussed eval findings, rehab rationale and POC and patient is in agreement  Person educated: Patient Education method: Explanation Education comprehension: verbalized understanding and needs further education  HOME EXERCISE PROGRAM: Access Code: [email or web address] URL: [email or web address] Date: 01/08/2023 Prepared by: Gustavus Bryant  Exercises - Supine Piriformis Stretch with Foot on Ground  - 2 x daily - 5 x weekly - 1 sets - 2 reps - 30s hold - Seated Table Hamstring Stretch  - 2 x daily - 5 x weekly - 1 sets - 2 reps - 30s hold - Clamshell  - 2 x daily - 5 x weekly - 1 sets - 15 reps  ASSESSMENT:  CLINICAL IMPRESSION: Patient presents to first follow up PT session reporting continued Rt hip pain and pain with HEP. Session today focused on proximal hip strengthening and stretching for the Rt hip in particular. Patient was able to tolerate all prescribed exercises with no adverse effects. Patient continues to benefit from skilled PT services and should be progressed as able to improve functional independence.   EVAL-Patient is a 47 y.o. female who was seen today for physical therapy evaluation and treatment for chronic L hip and lateral leg symptoms. Patient is TTP over greater trochanter and notes pain with ITB stretch.  Mild weakness in L hip abduction noted.  Some guarding noted throughout special testing.  Nerve tension signs negative.  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    OBJECTIVE IMPAIRMENTS: decreased activity tolerance, decreased knowledge of condition, decreased ROM, decreased strength, and pain.   ACTIVITY LIMITATIONS: sitting, standing, squatting, sleeping, and stairs  PERSONAL FACTORS: Fitness, Past/current experiences, Time since onset of injury/illness/exacerbation, and language barrier  are also affecting patient's  functional outcome.   REHAB POTENTIAL: Good  CLINICAL DECISION MAKING: Stable/uncomplicated  EVALUATION COMPLEXITY: Low   GOALS: Goals reviewed with patient? No  SHORT TERM GOALS=LONG TERM GOALS: Target date: 02/05/2023   Patient to demonstrate independence in HEP  Baseline: [id number] Goal status: INITIAL  2.  Decrease pain to 2/10 Baseline: 4/10 Goal status: INITIAL  3.  Increase L hip strength to 4/5 Baseline: 4-/5 Goal status: INITIAL  4.  Increase FOTO score to 74 Baseline: 66 Goal status: INITIAL  5.  Decrease 5x STS time to <15s arms crossed Baseline: 16s arms crossed Goal status: INITIAL   PLAN:  PT FREQUENCY: 1-2x/week  PT DURATION: 4 weeks  PLANNED INTERVENTIONS: Therapeutic exercises, Therapeutic activity, Neuromuscular re-education, Balance training, Gait training, Patient/Family education, Self Care, Joint mobilization, Stair training, Dry Needling, Manual therapy, and Re-evaluation  PLAN FOR NEXT SESSION: HEP review and update, manual techniques as appropriate, aerobic tasks, ROM and flexibility activities, strengthening and PREs, TPDN, gait and balance training as needed     Berta Minor, PTA 01/16/2023, 9:46 AM

## 2023-01-16 ENCOUNTER — Ambulatory Visit: Payer: Self-pay

## 2023-01-16 DIAGNOSIS — M6281 Muscle weakness (generalized): Secondary | ICD-10-CM

## 2023-01-16 DIAGNOSIS — M7062 Trochanteric bursitis, left hip: Secondary | ICD-10-CM

## 2023-01-20 NOTE — Therapy (Unsigned)
OUTPATIENT PHYSICAL THERAPY TREATMENT NOTE   Patient Name: Jennifer Noble MRN: [id number] DOB:1976/07/18, 47 y.o., female Today's Date: 01/21/2023  END OF SESSION:  PT End of Session - 01/21/23 1702     Visit Number 3    Number of Visits 8    Date for PT Re-Evaluation 03/05/23    Authorization Type CAFA    PT Start Time 1700    PT Stop Time 1740    PT Time Calculation (min) 40 min    Activity Tolerance Patient tolerated treatment well    Behavior During Therapy [facility] for tasks assessed/performed               Past Medical History:  Diagnosis Date   Anxiety    Depression    Dysuria    Hemorrhoid    High cholesterol    Past Surgical History:  Procedure Laterality Date   HEMORRHOID SURGERY     Patient Active Problem List   Diagnosis Date Noted   Vasomotor symptoms due to menopause 12/03/2022   Headache 05/07/2016   Depression 08/17/2015   Insomnia 08/17/2015   Elevated lipids 04/08/2013   Family history of early CAD 04/08/2013   Hemorrhoid    Dysuria     PCP: Claiborne Rigg, NP   REFERRING PROVIDER: Madelyn Brunner, DO  REFERRING DIAG: [phone] (ICD-10-CM) - Greater trochanteric pain syndrome of left lower extremity  THERAPY DIAG:  Trochanteric bursitis of left hip  Muscle weakness (generalized)  Rationale for Evaluation and Treatment: Rehabilitation  ONSET DATE: chronic  SUBJECTIVE:   SUBJECTIVE STATEMENT: Essentially unchanging pain levels.  Feels exercise are easier to perform.  Symptom intensity 7-8/10.   PERTINENT HISTORY: (From MD note)Discussed with Jonise that I do think the majority of her left hip and leg pain is coming from greater trochanteric pain syndrome.  She also has associated weakness of her lateral hip abductors which I think is aggravating her pain.  She does have some mild lumbar DDD more so at the L5-S1 region, although I am less suspicious of this being true radiculopathy.  We discussed all treatment options such  as oral medication therapy, formal versus home therapy, injection therapy, advanced imaging.  She is already taking meloxicam 15 mg once daily, I would like her to continue this.  I do not think Robaxin will be very helpful, she may discontinue this if she wishes.  We will start her on methylprednisolone 6-day taper course to help calm down the inflammation.  She would like to get started in formalized physical therapy, a referral was sent today for greater trochanteric pain syndrome and to focus on hip strengthening.  She is in the process of working through obtaining insurance/Medicaid, she may need to wait for this until she gets therapy approval.  I would like to see her back in 6 weeks for reevaluation.  If she is not doing better, we may ascertain a greater trochanteric injection.  PAIN:  Are you having pain? Yes: NPRS scale: 7/10 Pain location: L hip and lateral thigh Pain description: ache and tingling Aggravating factors: standing from sitting Relieving factors: undetermined  PRECAUTIONS: None  WEIGHT BEARING RESTRICTIONS: No  FALLS:  Has patient fallen in last 6 months? No  OCCUPATION: homemaker  PLOF: Independent  PATIENT GOALS: To reduce and manage my hip pain  NEXT MD VISIT: 01/22/23  OBJECTIVE:   DIAGNOSTIC FINDINGS: XR Lumbar Spine Complete   Result Date: 12/11/2022 4 views of the lumbar spine including AP, lateral and flexion  and extension views were ordered and reviewed by myself.  X-rays demonstrate no notable scoliosis.  There is mild intervertebral disc space narrowing between L5-S1 with mild DDD.  There is some facet arthropathy at the L5 region, otherwise no acute fracture or other bony abnormality noted.   XR HIP UNILAT W OR W/O PELVIS 2-3 VIEWS LEFT   Result Date: 12/11/2022 2 views of the left hip including AP and lateral film were ordered and reviewed by myself.  X-ray shows a femoral head well-seated within the acetabulum.  There is a well-preserved joint space  with only minimal OA.  There is a small spur off the inferior aspect of the acetabulum.   PATIENT SURVEYS:  FOTO 66(74 predicted)  MUSCLE LENGTH: Hamstrings: Right 90 deg; Left 90 deg   POSTURE: No Significant postural limitations  PALPATION: TTP L greater trochanter  LOWER EXTREMITY ROM: WNL throuhout  Passive ROM Right eval Left eval  Hip flexion    Hip extension    Hip abduction    Hip adduction    Hip internal rotation    Hip external rotation    Knee flexion    Knee extension    Ankle dorsiflexion    Ankle plantarflexion    Ankle inversion    Ankle eversion     (Blank rows = not tested)  LOWER EXTREMITY MMT:  MMT Right eval Left eval  Hip flexion 5 4-  Hip extension 5 4-  Hip abduction 5 4-  Hip adduction    Hip internal rotation    Hip external rotation    Knee flexion 5 5  Knee extension 5 5  Ankle dorsiflexion    Ankle plantarflexion    Ankle inversion    Ankle eversion     (Blank rows = not tested)  LOWER EXTREMITY SPECIAL TESTS:  Hip special tests: Luisa Hart (FABER) test: positive , Trendelenburg test: negative, Ober's test: negative, and Piriformis test: negative  FUNCTIONAL TESTS:  5 times sit to stand: 16s arms crossed  GAIT: Distance walked: 36ftx2 Assistive device utilized: None Level of assistance: Complete Independence Comments: unremarkable   TODAY'S TREATMENT:    [facility] Adult PT Treatment:                                                DATE: 01/21/23 Therapeutic Exercise: Nustep L2 8 min Seated hamstring stretch L 30s x2 L QL stretch 30s x2 Bridge with ball 15x Bridge against GTB 15x Supine hip fallouts GTB 15x B, 15/15 unilaterally L hip flexor stretch 30s x2 S/L L abduction in extension bias 15x L runners step 10# KB 6 in 15x  [facility] Adult PT Treatment:                                                DATE: 01/16/23 Therapeutic Exercise: Nustep level 5 x 5 mins Standing hip abduction/extension 2x10 each BIL Sidestepping at  counter RTB at ankles x 3 laps Step up with contralateral march 6" x10 BIL, with 5# KB x10 BIL Seated hamstring stretch 2x30" BIL Bridges with ball 2x10 Seated figure 4 piriformis stretch x30" BIL push/pull Supine figure 4 piriformis stretch 2x30" BIL Supine ITB stretch with strap 2x30" Lt Supine QL stretch 2x30"  BIL                                                                                                                             DATE: 5//22/24 Eval and HEP    PATIENT EDUCATION:  Education details: Discussed eval findings, rehab rationale and POC and patient is in agreement  Person educated: Patient Education method: Explanation Education comprehension: verbalized understanding and needs further education  HOME EXERCISE PROGRAM: Access Code: [email or web address] URL: [email or web address] Date: 01/21/2023 Prepared by: Gustavus Bryant  Exercises - Supine Piriformis Stretch with Foot on Ground  - 2 x daily - 5 x weekly - 1 sets - 2 reps - 30s hold - Seated Table Hamstring Stretch  - 2 x daily - 5 x weekly - 1 sets - 2 reps - 30s hold - Clamshell  - 2 x daily - 5 x weekly - 1 sets - 15 reps - Sidelying Hip Abduction  - 2 x daily - 5 x weekly - 1 sets - 15 reps - Supine Bridge  - 2 x daily - 5 x weekly - 1 sets - 15 reps  ASSESSMENT:  CLINICAL IMPRESSION: Continued stretching of L hip followed by strengthening exercises.  Incorporated theraband resistance, increased time on aerobic work, added weight.  HEP updated to reflect progress and address remaining deficits.  EVAL-Patient is a 47 y.o. female who was seen today for physical therapy evaluation and treatment for chronic L hip and lateral leg symptoms. Patient is TTP over greater trochanter and notes pain with ITB stretch.  Mild weakness in L hip abduction noted.  Some guarding noted throughout special testing.  Nerve tension signs negative.  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    OBJECTIVE IMPAIRMENTS: decreased activity tolerance, decreased knowledge of condition, decreased ROM, decreased strength, and pain.   ACTIVITY LIMITATIONS: sitting, standing, squatting, sleeping, and stairs  PERSONAL FACTORS: Fitness, Past/current experiences, Time since onset of injury/illness/exacerbation, and language barrier  are also affecting patient's functional outcome.   REHAB POTENTIAL: Good  CLINICAL DECISION MAKING: Stable/uncomplicated  EVALUATION COMPLEXITY: Low   GOALS: Goals reviewed with patient? No  SHORT TERM GOALS=LONG TERM GOALS: Target date: 02/05/2023   Patient to demonstrate independence in HEP  Baseline: [email or web address] Goal status: INITIAL  2.  Decrease pain to 2/10 Baseline: 4/10 Goal status: INITIAL  3.  Increase L hip strength to 4/5 Baseline: 4-/5 Goal status: INITIAL  4.  Increase FOTO score to 74 Baseline: 66 Goal status: INITIAL  5.  Decrease 5x STS time to <15s arms crossed Baseline: 16s arms crossed Goal status: INITIAL   PLAN:  PT FREQUENCY: 1-2x/week  PT DURATION: 4 weeks  PLANNED INTERVENTIONS: Therapeutic exercises, Therapeutic activity, Neuromuscular re-education, Balance training, Gait training, Patient/Family education, Self Care, Joint mobilization, Stair training, Dry Needling, Manual therapy, and Re-evaluation  PLAN FOR NEXT SESSION: HEP review and update, manual techniques as appropriate, aerobic tasks, ROM and  flexibility activities, strengthening and PREs, TPDN, gait and balance training as needed     Hildred Laser, PT 01/21/2023, 5:48 PM

## 2023-01-21 ENCOUNTER — Ambulatory Visit: Payer: Self-pay | Attending: Sports Medicine

## 2023-01-21 DIAGNOSIS — M7062 Trochanteric bursitis, left hip: Secondary | ICD-10-CM | POA: Insufficient documentation

## 2023-01-21 DIAGNOSIS — M6281 Muscle weakness (generalized): Secondary | ICD-10-CM | POA: Insufficient documentation

## 2023-01-22 ENCOUNTER — Ambulatory Visit (INDEPENDENT_AMBULATORY_CARE_PROVIDER_SITE_OTHER): Payer: Self-pay | Admitting: Sports Medicine

## 2023-01-22 ENCOUNTER — Encounter: Payer: Self-pay | Admitting: Sports Medicine

## 2023-01-22 ENCOUNTER — Encounter: Payer: Self-pay | Admitting: Vascular Surgery

## 2023-01-22 ENCOUNTER — Ambulatory Visit (INDEPENDENT_AMBULATORY_CARE_PROVIDER_SITE_OTHER): Payer: Self-pay | Admitting: Vascular Surgery

## 2023-01-22 ENCOUNTER — Ambulatory Visit (HOSPITAL_COMMUNITY)
Admission: RE | Admit: 2023-01-22 | Discharge: 2023-01-22 | Disposition: A | Discharge status: Home / Self Care | Payer: Self-pay | Source: Ambulatory Visit | Attending: Vascular Surgery | Admitting: Vascular Surgery

## 2023-01-22 VITALS — BP 115/74 | HR 79 | Temp 98.0°F | Resp 18 | Ht 62.5 in | Wt 163.8 lb

## 2023-01-22 DIAGNOSIS — I8393 Asymptomatic varicose veins of bilateral lower extremities: Secondary | ICD-10-CM

## 2023-01-22 DIAGNOSIS — M25552 Pain in left hip: Secondary | ICD-10-CM

## 2023-01-22 DIAGNOSIS — R29898 Other symptoms and signs involving the musculoskeletal system: Secondary | ICD-10-CM

## 2023-01-22 DIAGNOSIS — I83813 Varicose veins of bilateral lower extremities with pain: Secondary | ICD-10-CM

## 2023-01-22 MED ORDER — BETAMETHASONE SOD PHOS & ACET 6 (3-3) MG/ML IJ SUSP
6.0000 mg | INTRAMUSCULAR | Status: AC | PRN
  Administered 2023-01-22: 6 mg via INTRA_ARTICULAR

## 2023-01-22 MED ORDER — BUPIVACAINE HCL 0.25 % IJ SOLN
2.0000 mL | INTRAMUSCULAR | Status: AC | PRN
  Administered 2023-01-22: 2 mL via INTRA_ARTICULAR

## 2023-01-22 MED ORDER — LIDOCAINE HCL 1 % IJ SOLN
2.0000 mL | INTRAMUSCULAR | Status: AC | PRN
  Administered 2023-01-22: 2 mL

## 2023-01-22 NOTE — Progress Notes (Signed)
Not much improvement Still feels the same Has only done 2 PT visits; denies any OTC medication

## 2023-01-22 NOTE — Progress Notes (Signed)
Jennifer Noble - 47 y.o. female MRN [id number]  Date of birth: [birth year]  Office Visit Note: Visit Date: 01/22/2023 PCP: Claiborne Rigg, NP Referred by: Claiborne Rigg, NP  Subjective: Chief Complaint  Patient presents with   Left Hip - Follow-up   HPI: Jennifer Noble is a pleasant 47 y.o. female who presents today for follow-up of left lateral hip pain.  The use of an in person Spanish interpreter was used throughout the entirety of the visit today.  Jennifer Noble is still having pain over the lateral trochanter.  Not much improvement.  Has had 2 physical therapy appointments, they did give her some home exercises to work on.  Having pain over the lateral hip that will go down the mid lateral thigh.  No longer having any numbness tingling or radicular symptoms.  Pertinent ROS were reviewed with the patient and found to be negative unless otherwise specified above in HPI.   Assessment & Plan: Visit Diagnoses:  1. Greater trochanteric pain syndrome of left lower extremity   2. Weakness of left hip    Plan: Discussed with Jennifer Noble treatment options for her lateral hip.  I do think this is certainly coming from the lateral trochanter and her hip abductors and not her back as she has no radicular symptoms.  She has had 2 physical therapy appointments but is not making significant improvement.  Through shared decision making elected to proceed with greater trochanteric injection.  This will hopefully calm down her pain so that she can ramp up and increase her physical therapy and home rehab to strengthen her lateral hip abductors.  She may use ice, any over-the-counter anti-inflammatories as needed for any postinjection pain.  Will follow-up in 6 weeks.  Follow-up: Return in about 6 weeks (around 03/05/2023) for for left lateral hip pain.   Meds & Orders: No orders of the defined types were placed in this encounter.   Orders Placed This Encounter  Procedures    Large Joint Inj     Procedures: Large Joint Inj: L greater trochanter on 01/22/2023 10:33 AM Indications: pain Details: 25 G 1.5 in needle, lateral approach Medications: 2 mL lidocaine 1 %; 2 mL bupivacaine 0.25 %; 6 mg betamethasone acetate-betamethasone sodium phosphate 6 (3-3) MG/ML Outcome: tolerated well, no immediate complications Procedure, treatment alternatives, risks and benefits explained, specific risks discussed. Consent was given by the patient. Immediately prior to procedure a time out was called to verify the correct patient, procedure, equipment, support staff and site/side marked as required. Patient was prepped and draped in the usual sterile fashion.          Clinical History: No specialty comments available.  She reports that she has never smoked. She has never used smokeless tobacco.  Recent Labs    07/19/22 1109 12/05/22 1541  HGBA1C 6.7* 6.0*    Objective:   Vital Signs: LMP 02/27/2021 (Approximate)   Physical Exam  Gen: Well-appearing, in no acute distress; non-toxic CV: Well-perfused. Warm.  Resp: Breathing unlabored on room air; no wheezing. Psych: Fluid speech in conversation; appropriate affect; normal thought process Neuro: Sensation intact throughout. No gross coordination deficits.   Ortho Exam - Left hip: + TTP over the greater trochanteric region.  There is weakness with 4/5 strength of lateral hip abduction compared to 5/5 strength of the contralateral hip.  There is some mild reproducible pain with this.  Otherwise full range of motion and strength about the hip joint.  Overlying redness or swelling.  Imaging: No results found.  Past Medical/Family/Surgical/Social History: Medications & Allergies reviewed per EMR, new medications updated. Patient Active Problem List   Diagnosis Date Noted   Vasomotor symptoms due to menopause 12/03/2022   Headache 05/07/2016   Depression 08/17/2015   Insomnia 08/17/2015   Elevated lipids 04/08/2013    Family history of early CAD 04/08/2013   Hemorrhoid    Dysuria    Past Medical History:  Diagnosis Date   Anxiety    Depression    Dysuria    Hemorrhoid    High cholesterol    Family History  Problem Relation Age of Onset   Diabetes Mother    Diabetes Sister    Diabetes Brother    Heart disease Other    Breast cancer Neg Hx    Past Surgical History:  Procedure Laterality Date   HEMORRHOID SURGERY     Social History   Occupational History   Not on file  Tobacco Use   Smoking status: Never   Smokeless tobacco: Never  Vaping Use   Vaping Use: Never used  Substance and Sexual Activity   Alcohol use: No   Drug use: No   Sexual activity: Yes    Birth control/protection: Post-menopausal

## 2023-01-22 NOTE — Progress Notes (Signed)
ASSESSMENT & PLAN   CHRONIC VENOUS DISEASE: This patient has CEAP C1 venous disease (telangiectasias).  On her venous duplex today of the right lower extremity there was no evidence of significant superficial venous reflux.  Thus she is not a candidate for laser ablation.  We have discussed conservative measures for her venous disease.  Specifically I encouraged her to avoid prolonged sitting and standing.  We discussed the importance of exercise.  We have fitted her for some knee-high compression stockings with a gradient of 15 to 20 mmHg.  We discussed the importance of daily leg elevation and the proper positioning for this.  If she would like to consider sclerotherapy for her telangiectasias I have taken multiple pictures below and she will call Jennifer Noble, Jennifer Noble our sclerotherapy nurse to discuss this option.  REASON FOR CONSULT:    Varicose veins.  The consult is requested by Dr. Sharon Seller.   HPI:   Jennifer Noble is a 47 y.o. female who is referred with spider veins of both lower extremities.  The patient complains of some aching pain in her legs when she is standing that is relieved with elevation.  Of note the history is obtained through the translation device.  She denies any previous history of DVT.  She has had no previous venous procedures.  She denies any chest pain or shortness of breath.  Past Medical History:  Diagnosis Date   Anxiety    Depression    Dysuria    Hemorrhoid    High cholesterol     Family History  Problem Relation Age of Onset   Diabetes Mother    Diabetes Sister    Diabetes Brother    Heart disease Other    Breast cancer Neg Hx     SOCIAL HISTORY: Social History   Tobacco Use   Smoking status: Never   Smokeless tobacco: Never  Substance Use Topics   Alcohol use: No    Allergies  Allergen Reactions   Naproxen Nausea And Vomiting    Current Outpatient Medications  Medication Sig Dispense Refill   citalopram (CELEXA) 20 MG  tablet Take 1 tablet (20 mg total) by mouth daily. 90 tablet 3   hydrOXYzine (ATARAX) 10 MG tablet Take 1 tablet (10 mg total) by mouth 3 (three) times daily as needed. (Patient not taking: Reported on 09/19/2022) 90 tablet 3   meloxicam (MOBIC) 15 MG tablet Take 1 tablet (15 mg total) by mouth daily. (Patient not taking: Reported on 01/22/2023) 30 tablet 0   methocarbamol (ROBAXIN) 500 MG tablet Take 1-2 tablets (500-1,000 mg total) by mouth every 8 (eight) hours as needed for muscle pain (Patient not taking: Reported on 01/22/2023) 90 tablet 0   methylPREDNISolone (MEDROL DOSEPAK) 4 MG TBPK tablet Take per packet instructions. Taper dosing. (Patient not taking: Reported on 01/22/2023) 21 each 0   No current facility-administered medications for this visit.    REVIEW OF SYSTEMS:  [X]  denotes positive finding, [ ]  denotes negative finding Cardiac  Comments:  Chest pain or chest pressure:    Shortness of breath upon exertion:    Short of breath when lying flat:    Irregular heart rhythm:        Vascular    Pain in calf, thigh, or hip brought on by ambulation:    Pain in feet at night that wakes you up from your sleep:     Blood clot in your veins:    Leg swelling:  Pulmonary    Oxygen at home:    Productive cough:     Wheezing:         Neurologic    Sudden weakness in arms or legs:     Sudden numbness in arms or legs:     Sudden onset of difficulty speaking or slurred speech:    Temporary loss of vision in one eye:     Problems with dizziness:         Gastrointestinal    Blood in stool:     Vomited blood:         Genitourinary    Burning when urinating:     Blood in urine:        Psychiatric    Major depression:         Hematologic    Bleeding problems:    Problems with blood clotting too easily:        Skin    Rashes or ulcers:        Constitutional    Fever or chills:    -  PHYSICAL EXAM:   Vitals:   01/22/23 1341  BP: 115/74  Pulse: 79  Resp: 18  Temp:  98 F (36.7 C)  TempSrc: Temporal  SpO2: 98%  Weight: 163 lb 12.8 oz (74.3 kg)  Height: 5' 2.5" (1.588 m)   Body mass index is 29.48 kg/m. GENERAL: The patient is a well-nourished female, in no acute distress. The vital signs are documented above. CARDIAC: There is a regular rate and rhythm.  VASCULAR: I do not detect carotid bruits. She has palpable pedal pulses. She has telangiectasias and some reticular veins in her thighs and legs bilaterally.         PULMONARY: There is good air exchange bilaterally without wheezing or rales. ABDOMEN: Soft and non-tender with normal pitched bowel sounds.  MUSCULOSKELETAL: There are no major deformities. NEUROLOGIC: No focal weakness or paresthesias are detected. SKIN: There are no ulcers or rashes noted. PSYCHIATRIC: The patient has a normal affect.  DATA:    VENOUS DUPLEX: I have independently interpreted her venous duplex scan today.  This was of the right lower extremity only.  There was no evidence of DVT.  There was deep venous reflux in the popliteal vein only.  There was no significant superficial venous reflux.  Waverly [facility]

## 2023-01-24 ENCOUNTER — Ambulatory Visit: Payer: Self-pay

## 2023-01-24 NOTE — Telephone Encounter (Signed)
Pt called back today to schedule a financial appt for renewal of her orange card.  [phone]

## 2023-01-29 ENCOUNTER — Ambulatory Visit: Payer: Self-pay

## 2023-01-29 DIAGNOSIS — M7062 Trochanteric bursitis, left hip: Secondary | ICD-10-CM

## 2023-01-29 DIAGNOSIS — M6281 Muscle weakness (generalized): Secondary | ICD-10-CM

## 2023-01-29 NOTE — Therapy (Signed)
OUTPATIENT PHYSICAL THERAPY TREATMENT NOTE   Patient Name: Jennifer Noble MRN: [id number] DOB:[birth year], 47 y.o., female Today's Date: 01/29/2023  END OF SESSION:  PT End of Session - 01/29/23 1720     Visit Number 4    Number of Visits 8    Date for PT Re-Evaluation 03/05/23    Authorization Type CAFA    PT Start Time 1725    PT Stop Time 1805    PT Time Calculation (min) 40 min    Activity Tolerance Patient tolerated treatment well    Behavior During Therapy [facility] for tasks assessed/performed            Past Medical History:  Diagnosis Date   Anxiety    Depression    Dysuria    Hemorrhoid    High cholesterol    Past Surgical History:  Procedure Laterality Date   HEMORRHOID SURGERY     Patient Active Problem List   Diagnosis Date Noted   Vasomotor symptoms due to menopause 12/03/2022   Headache 05/07/2016   Depression 08/17/2015   Insomnia 08/17/2015   Elevated lipids 04/08/2013   Family history of early CAD 04/08/2013   Hemorrhoid    Dysuria     PCP: Claiborne Rigg, NP   REFERRING PROVIDER: Madelyn Brunner, DO  REFERRING DIAG: [phone] (ICD-10-CM) - Greater trochanteric pain syndrome of left lower extremity  THERAPY DIAG:  Trochanteric bursitis of left hip  Muscle weakness (generalized)  Rationale for Evaluation and Treatment: Rehabilitation  ONSET DATE: chronic  SUBJECTIVE:   SUBJECTIVE STATEMENT: Patient reports no current pain, stating that she is feeling much better. She reports she got a injection last week which has greatly helped her pain.    PERTINENT HISTORY: (From MD note)Discussed with Junell that I do think the majority of her left hip and leg pain is coming from greater trochanteric pain syndrome.  She also has associated weakness of her lateral hip abductors which I think is aggravating her pain.  She does have some mild lumbar DDD more so at the L5-S1 region, although I am less suspicious of this being true  radiculopathy.  We discussed all treatment options such as oral medication therapy, formal versus home therapy, injection therapy, advanced imaging.  She is already taking meloxicam 15 mg once daily, I would like her to continue this.  I do not think Robaxin will be very helpful, she may discontinue this if she wishes.  We will start her on methylprednisolone 6-day taper course to help calm down the inflammation.  She would like to get started in formalized physical therapy, a referral was sent today for greater trochanteric pain syndrome and to focus on hip strengthening.  She is in the process of working through obtaining insurance/Medicaid, she may need to wait for this until she gets therapy approval.  I would like to see her back in 6 weeks for reevaluation.  If she is not doing better, we may ascertain a greater trochanteric injection.  PAIN:  Are you having pain? Yes: NPRS scale: 0/10 Pain location: L hip and lateral thigh Pain description: ache and tingling Aggravating factors: standing from sitting Relieving factors: undetermined  PRECAUTIONS: None  WEIGHT BEARING RESTRICTIONS: No  FALLS:  Has patient fallen in last 6 months? No  OCCUPATION: homemaker  PLOF: Independent  PATIENT GOALS: To reduce and manage my hip pain  NEXT MD VISIT: 01/22/23  OBJECTIVE:   DIAGNOSTIC FINDINGS: XR Lumbar Spine Complete   Result Date: 12/11/2022 4 views  of the lumbar spine including AP, lateral and flexion and extension views were ordered and reviewed by myself.  X-rays demonstrate no notable scoliosis.  There is mild intervertebral disc space narrowing between L5-S1 with mild DDD.  There is some facet arthropathy at the L5 region, otherwise no acute fracture or other bony abnormality noted.   XR HIP UNILAT W OR W/O PELVIS 2-3 VIEWS LEFT   Result Date: 12/11/2022 2 views of the left hip including AP and lateral film were ordered and reviewed by myself.  X-ray shows a femoral head well-seated  within the acetabulum.  There is a well-preserved joint space with only minimal OA.  There is a small spur off the inferior aspect of the acetabulum.   PATIENT SURVEYS:  FOTO 66(74 predicted)  MUSCLE LENGTH: Hamstrings: Right 90 deg; Left 90 deg   POSTURE: No Significant postural limitations  PALPATION: TTP L greater trochanter  LOWER EXTREMITY ROM: WNL throuhout  Passive ROM Right eval Left eval  Hip flexion    Hip extension    Hip abduction    Hip adduction    Hip internal rotation    Hip external rotation    Knee flexion    Knee extension    Ankle dorsiflexion    Ankle plantarflexion    Ankle inversion    Ankle eversion     (Blank rows = not tested)  LOWER EXTREMITY MMT:  MMT Right eval Left eval  Hip flexion 5 4-  Hip extension 5 4-  Hip abduction 5 4-  Hip adduction    Hip internal rotation    Hip external rotation    Knee flexion 5 5  Knee extension 5 5  Ankle dorsiflexion    Ankle plantarflexion    Ankle inversion    Ankle eversion     (Blank rows = not tested)  LOWER EXTREMITY SPECIAL TESTS:  Hip special tests: Luisa Hart (FABER) test: positive , Trendelenburg test: negative, Ober's test: negative, and Piriformis test: negative  FUNCTIONAL TESTS:  5 times sit to stand: 16s arms crossed  GAIT: Distance walked: 61ftx2 Assistive device utilized: None Level of assistance: Complete Independence Comments: unremarkable   TODAY'S TREATMENT:    [facility] Adult PT Treatment:                                                DATE: 01/29/23 Therapeutic Exercise: Nustep L3 8 min Seated hamstring stretch L 30s x2 S/L clamshell RTB x15 BIL L QL stretch 30s x2 Bridge with ball 2x10 Bridge with clamshell RTB x15 Supine hip fallouts GTB 15x B, 15/15 unilaterally SLR with contralateral pilates ring push 2x10 BIL  [facility] Adult PT Treatment:                                                DATE: 01/21/23 Therapeutic Exercise: Nustep L2 8 min Seated hamstring stretch L  30s x2 L QL stretch 30s x2 Bridge with ball 15x Bridge against GTB 15x Supine hip fallouts GTB 15x B, 15/15 unilaterally L hip flexor stretch 30s x2 S/L L abduction in extension bias 15x L runners step 10# KB 6 in 15x  [facility] Adult PT Treatment:  DATE: 01/16/23 Therapeutic Exercise: Nustep level 5 x 5 mins Standing hip abduction/extension 2x10 each BIL Sidestepping at counter RTB at ankles x 3 laps Step up with contralateral march 6" x10 BIL, with 5# KB x10 BIL Seated hamstring stretch 2x30" BIL Bridges with ball 2x10 Seated figure 4 piriformis stretch x30" BIL push/pull Supine figure 4 piriformis stretch 2x30" BIL Supine ITB stretch with strap 2x30" Lt Supine QL stretch 2x30" BIL   PATIENT EDUCATION:  Education details: Discussed eval findings, rehab rationale and POC and patient is in agreement  Person educated: Patient Education method: Explanation Education comprehension: verbalized understanding and needs further education  HOME EXERCISE PROGRAM: Access Code: [email or web address] URL: [email or web address] Date: 01/21/2023 Prepared by: Gustavus Bryant  Exercises - Supine Piriformis Stretch with Foot on Ground  - 2 x daily - 5 x weekly - 1 sets - 2 reps - 30s hold - Seated Table Hamstring Stretch  - 2 x daily - 5 x weekly - 1 sets - 2 reps - 30s hold - Clamshell  - 2 x daily - 5 x weekly - 1 sets - 15 reps - Sidelying Hip Abduction  - 2 x daily - 5 x weekly - 1 sets - 15 reps - Supine Bridge  - 2 x daily - 5 x weekly - 1 sets - 15 reps  ASSESSMENT:  CLINICAL IMPRESSION:  Patient presents to PT reporting no current pain and that she has been feeling much better. She states that the injection she received last week has been very helpful. She is waiting to hear about more approval from financial aid, but asks for more exercises to perform at home if she cannot get approval for a few weeks. Added to HEP today and discussed with  patient not performing every exercise daily, but to select up to 5-6 each day to perform. Session today focused on proximal hip strengthening with increased resistance and difficulty today to good effect.    EVAL-Patient is a 47 y.o. female who was seen today for physical therapy evaluation and treatment for chronic L hip and lateral leg symptoms. Patient is TTP over greater trochanter and notes pain with ITB stretch.  Mild weakness in L hip abduction noted.  Some guarding noted throughout special testing.  Nerve tension signs negative.  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    OBJECTIVE IMPAIRMENTS: decreased activity tolerance, decreased knowledge of condition, decreased ROM, decreased strength, and pain.   ACTIVITY LIMITATIONS: sitting, standing, squatting, sleeping, and stairs  PERSONAL FACTORS: Fitness, Past/current experiences, Time since onset of injury/illness/exacerbation, and language barrier  are also affecting patient's functional outcome.   REHAB POTENTIAL: Good  CLINICAL DECISION MAKING: Stable/uncomplicated  EVALUATION COMPLEXITY: Low   GOALS: Goals reviewed with patient? No  SHORT TERM GOALS=LONG TERM GOALS: Target date: 02/05/2023   Patient to demonstrate independence in HEP  Baseline: [id number] Goal status: INITIAL  2.  Decrease pain to 2/10 Baseline: 4/10 Goal status: INITIAL  3.  Increase L hip strength to 4/5 Baseline: 4-/5 Goal status: INITIAL  4.  Increase FOTO score to 74 Baseline: 66 Goal status: INITIAL  5.  Decrease 5x STS time to <15s arms crossed Baseline: 16s arms crossed Goal status: INITIAL   PLAN:  PT FREQUENCY: 1-2x/week  PT DURATION: 4 weeks  PLANNED INTERVENTIONS: Therapeutic exercises, Therapeutic activity, Neuromuscular re-education, Balance training, Gait training, Patient/Family education, Self Care, Joint mobilization, Stair training, Dry Needling, Manual therapy, and  Re-evaluation  PLAN FOR  NEXT SESSION: HEP review and update, manual techniques as appropriate, aerobic tasks, ROM and flexibility activities, strengthening and PREs, TPDN, gait and balance training as needed     Berta Minor, PTA 01/29/2023, 6:06 PM

## 2023-02-04 ENCOUNTER — Ambulatory Visit: Payer: Self-pay

## 2023-02-04 NOTE — Therapy (Deleted)
OUTPATIENT PHYSICAL THERAPY TREATMENT NOTE   Patient Name: Jennifer Noble MRN: [id number] DOB:Jan 08, 1976, 47 y.o., female Today's Date: 02/04/2023  END OF SESSION:   Past Medical History:  Diagnosis Date   Anxiety    Depression    Dysuria    Hemorrhoid    High cholesterol    Past Surgical History:  Procedure Laterality Date   HEMORRHOID SURGERY     Patient Active Problem List   Diagnosis Date Noted   Vasomotor symptoms due to menopause 12/03/2022   Headache 05/07/2016   Depression 08/17/2015   Insomnia 08/17/2015   Elevated lipids 04/08/2013   Family history of early CAD 04/08/2013   Hemorrhoid    Dysuria     PCP: Claiborne Rigg, NP   REFERRING PROVIDER: Madelyn Brunner, DO  REFERRING DIAG: [phone] (ICD-10-CM) - Greater trochanteric pain syndrome of left lower extremity  THERAPY DIAG:  No diagnosis found.  Rationale for Evaluation and Treatment: Rehabilitation  ONSET DATE: chronic  SUBJECTIVE:   SUBJECTIVE STATEMENT: Patient reports no current pain, stating that she is feeling much better. She reports she got a injection last week which has greatly helped her pain.    PERTINENT HISTORY: (From MD note)Discussed with Sabel that I do think the majority of her left hip and leg pain is coming from greater trochanteric pain syndrome.  She also has associated weakness of her lateral hip abductors which I think is aggravating her pain.  She does have some mild lumbar DDD more so at the L5-S1 region, although I am less suspicious of this being true radiculopathy.  We discussed all treatment options such as oral medication therapy, formal versus home therapy, injection therapy, advanced imaging.  She is already taking meloxicam 15 mg once daily, I would like her to continue this.  I do not think Robaxin will be very helpful, she may discontinue this if she wishes.  We will start her on methylprednisolone 6-day taper course to help calm down the inflammation.   She would like to get started in formalized physical therapy, a referral was sent today for greater trochanteric pain syndrome and to focus on hip strengthening.  She is in the process of working through obtaining insurance/Medicaid, she may need to wait for this until she gets therapy approval.  I would like to see her back in 6 weeks for reevaluation.  If she is not doing better, we may ascertain a greater trochanteric injection.  PAIN:  Are you having pain? Yes: NPRS scale: 0/10 Pain location: L hip and lateral thigh Pain description: ache and tingling Aggravating factors: standing from sitting Relieving factors: undetermined  PRECAUTIONS: None  WEIGHT BEARING RESTRICTIONS: No  FALLS:  Has patient fallen in last 6 months? No  OCCUPATION: homemaker  PLOF: Independent  PATIENT GOALS: To reduce and manage my hip pain  NEXT MD VISIT: 01/22/23  OBJECTIVE:   DIAGNOSTIC FINDINGS: XR Lumbar Spine Complete   Result Date: 12/11/2022 4 views of the lumbar spine including AP, lateral and flexion and extension views were ordered and reviewed by myself.  X-rays demonstrate no notable scoliosis.  There is mild intervertebral disc space narrowing between L5-S1 with mild DDD.  There is some facet arthropathy at the L5 region, otherwise no acute fracture or other bony abnormality noted.   XR HIP UNILAT W OR W/O PELVIS 2-3 VIEWS LEFT   Result Date: 12/11/2022 2 views of the left hip including AP and lateral film were ordered and reviewed by myself.  X-ray shows  a femoral head well-seated within the acetabulum.  There is a well-preserved joint space with only minimal OA.  There is a small spur off the inferior aspect of the acetabulum.   PATIENT SURVEYS:  FOTO 66(74 predicted)  MUSCLE LENGTH: Hamstrings: Right 90 deg; Left 90 deg   POSTURE: No Significant postural limitations  PALPATION: TTP L greater trochanter  LOWER EXTREMITY ROM: WNL throuhout  Passive ROM Right eval Left eval  Hip  flexion    Hip extension    Hip abduction    Hip adduction    Hip internal rotation    Hip external rotation    Knee flexion    Knee extension    Ankle dorsiflexion    Ankle plantarflexion    Ankle inversion    Ankle eversion     (Blank rows = not tested)  LOWER EXTREMITY MMT:  MMT Right eval Left eval  Hip flexion 5 4-  Hip extension 5 4-  Hip abduction 5 4-  Hip adduction    Hip internal rotation    Hip external rotation    Knee flexion 5 5  Knee extension 5 5  Ankle dorsiflexion    Ankle plantarflexion    Ankle inversion    Ankle eversion     (Blank rows = not tested)  LOWER EXTREMITY SPECIAL TESTS:  Hip special tests: Luisa Hart (FABER) test: positive , Trendelenburg test: negative, Ober's test: negative, and Piriformis test: negative  FUNCTIONAL TESTS:  5 times sit to stand: 16s arms crossed  GAIT: Distance walked: 40ftx2 Assistive device utilized: None Level of assistance: Complete Independence Comments: unremarkable   TODAY'S TREATMENT:    [facility] Adult PT Treatment:                                                DATE: 01/29/23 Therapeutic Exercise: Nustep L3 8 min Seated hamstring stretch L 30s x2 S/L clamshell RTB x15 BIL L QL stretch 30s x2 Bridge with ball 2x10 Bridge with clamshell RTB x15 Supine hip fallouts GTB 15x B, 15/15 unilaterally SLR with contralateral pilates ring push 2x10 BIL  [facility] Adult PT Treatment:                                                DATE: 01/21/23 Therapeutic Exercise: Nustep L2 8 min Seated hamstring stretch L 30s x2 L QL stretch 30s x2 Bridge with ball 15x Bridge against GTB 15x Supine hip fallouts GTB 15x B, 15/15 unilaterally L hip flexor stretch 30s x2 S/L L abduction in extension bias 15x L runners step 10# KB 6 in 15x  [facility] Adult PT Treatment:                                                DATE: 01/16/23 Therapeutic Exercise: Nustep level 5 x 5 mins Standing hip abduction/extension 2x10 each BIL Sidestepping  at counter RTB at ankles x 3 laps Step up with contralateral march 6" x10 BIL, with 5# KB x10 BIL Seated hamstring stretch 2x30" BIL Bridges with ball 2x10 Seated figure 4 piriformis stretch x30" BIL push/pull Supine  figure 4 piriformis stretch 2x30" BIL Supine ITB stretch with strap 2x30" Lt Supine QL stretch 2x30" BIL   PATIENT EDUCATION:  Education details: Discussed eval findings, rehab rationale and POC and patient is in agreement  Person educated: Patient Education method: Explanation Education comprehension: verbalized understanding and needs further education  HOME EXERCISE PROGRAM: Access Code: [email or web address] URL: [email or web address] Date: 01/21/2023 Prepared by: Gustavus Bryant  Exercises - Supine Piriformis Stretch with Foot on Ground  - 2 x daily - 5 x weekly - 1 sets - 2 reps - 30s hold - Seated Table Hamstring Stretch  - 2 x daily - 5 x weekly - 1 sets - 2 reps - 30s hold - Clamshell  - 2 x daily - 5 x weekly - 1 sets - 15 reps - Sidelying Hip Abduction  - 2 x daily - 5 x weekly - 1 sets - 15 reps - Supine Bridge  - 2 x daily - 5 x weekly - 1 sets - 15 reps  ASSESSMENT:  CLINICAL IMPRESSION:  Patient presents to PT reporting no current pain and that she has been feeling much better. She states that the injection she received last week has been very helpful. She is waiting to hear about more approval from financial aid, but asks for more exercises to perform at home if she cannot get approval for a few weeks. Added to HEP today and discussed with patient not performing every exercise daily, but to select up to 5-6 each day to perform. Session today focused on proximal hip strengthening with increased resistance and difficulty today to good effect.    EVAL-Patient is a 47 y.o. female who was seen today for physical therapy evaluation and treatment for chronic L hip and lateral leg symptoms. Patient is TTP over greater trochanter and notes pain with ITB stretch.   Mild weakness in L hip abduction noted.  Some guarding noted throughout special testing.  Nerve tension signs negative.  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    OBJECTIVE IMPAIRMENTS: decreased activity tolerance, decreased knowledge of condition, decreased ROM, decreased strength, and pain.   ACTIVITY LIMITATIONS: sitting, standing, squatting, sleeping, and stairs  PERSONAL FACTORS: Fitness, Past/current experiences, Time since onset of injury/illness/exacerbation, and language barrier  are also affecting patient's functional outcome.   REHAB POTENTIAL: Good  CLINICAL DECISION MAKING: Stable/uncomplicated  EVALUATION COMPLEXITY: Low   GOALS: Goals reviewed with patient? No  SHORT TERM GOALS=LONG TERM GOALS: Target date: 02/05/2023   Patient to demonstrate independence in HEP  Baseline: [id number] Goal status: INITIAL  2.  Decrease pain to 2/10 Baseline: 4/10 Goal status: INITIAL  3.  Increase L hip strength to 4/5 Baseline: 4-/5 Goal status: INITIAL  4.  Increase FOTO score to 74 Baseline: 66 Goal status: INITIAL  5.  Decrease 5x STS time to <15s arms crossed Baseline: 16s arms crossed Goal status: INITIAL   PLAN:  PT FREQUENCY: 1-2x/week  PT DURATION: 4 weeks  PLANNED INTERVENTIONS: Therapeutic exercises, Therapeutic activity, Neuromuscular re-education, Balance training, Gait training, Patient/Family education, Self Care, Joint mobilization, Stair training, Dry Needling, Manual therapy, and Re-evaluation  PLAN FOR NEXT SESSION: HEP review and update, manual techniques as appropriate, aerobic tasks, ROM and flexibility activities, strengthening and PREs, TPDN, gait and balance training as needed     Hildred Laser, PT 02/04/2023, 9:26 AM

## 2023-02-06 ENCOUNTER — Ambulatory Visit: Payer: Self-pay

## 2023-02-07 ENCOUNTER — Ambulatory Visit: Payer: Self-pay

## 2023-02-14 ENCOUNTER — Ambulatory Visit: Payer: Self-pay | Attending: Nurse Practitioner

## 2023-02-24 NOTE — Therapy (Deleted)
OUTPATIENT PHYSICAL THERAPY TREATMENT NOTE   Patient Name: Jennifer Noble MRN: [id number] DOB:02/16/1976, 47 y.o., female Today's Date: 02/04/2023  END OF SESSION:   Past Medical History:  Diagnosis Date   Anxiety    Depression    Dysuria    Hemorrhoid    High cholesterol    Past Surgical History:  Procedure Laterality Date   HEMORRHOID SURGERY     Patient Active Problem List   Diagnosis Date Noted   Vasomotor symptoms due to menopause 12/03/2022   Headache 05/07/2016   Depression 08/17/2015   Insomnia 08/17/2015   Elevated lipids 04/08/2013   Family history of early CAD 04/08/2013   Hemorrhoid    Dysuria     PCP: Fleming, Zelda W, NP   REFERRING PROVIDER: Brooks, Dana, DO  REFERRING DIAG: M25.552 (ICD-10-CM) - Greater trochanteric pain syndrome of left lower extremity  THERAPY DIAG:  No diagnosis found.  Rationale for Evaluation and Treatment: Rehabilitation  ONSET DATE: chronic  SUBJECTIVE:   SUBJECTIVE STATEMENT: Patient reports no current pain, stating that she is feeling much better. She reports she got a injection last week which has greatly helped her pain.    PERTINENT HISTORY: (From MD note)Discussed with Blondell that I do think the majority of her left hip and leg pain is coming from greater trochanteric pain syndrome.  She also has associated weakness of her lateral hip abductors which I think is aggravating her pain.  She does have some mild lumbar DDD more so at the L5-S1 region, although I am less suspicious of this being true radiculopathy.  We discussed all treatment options such as oral medication therapy, formal versus home therapy, injection therapy, advanced imaging.  She is already taking meloxicam 15 mg once daily, I would like her to continue this.  I do not think Robaxin will be very helpful, she may discontinue this if she wishes.  We will start her on methylprednisolone 6-day taper course to help calm down the inflammation.   She would like to get started in formalized physical therapy, a referral was sent today for greater trochanteric pain syndrome and to focus on hip strengthening.  She is in the process of working through obtaining insurance/Medicaid, she may need to wait for this until she gets therapy approval.  I would like to see her back in 6 weeks for reevaluation.  If she is not doing better, we may ascertain a greater trochanteric injection.  PAIN:  Are you having pain? Yes: NPRS scale: 0/10 Pain location: L hip and lateral thigh Pain description: ache and tingling Aggravating factors: standing from sitting Relieving factors: undetermined  PRECAUTIONS: None  WEIGHT BEARING RESTRICTIONS: No  FALLS:  Has patient fallen in last 6 months? No  OCCUPATION: homemaker  PLOF: Independent  PATIENT GOALS: To reduce and manage my hip pain  NEXT MD VISIT: 01/22/23  OBJECTIVE:   DIAGNOSTIC FINDINGS: XR Lumbar Spine Complete   Result Date: 12/11/2022 4 views of the lumbar spine including AP, lateral and flexion and extension views were ordered and reviewed by myself.  X-rays demonstrate no notable scoliosis.  There is mild intervertebral disc space narrowing between L5-S1 with mild DDD.  There is some facet arthropathy at the L5 region, otherwise no acute fracture or other bony abnormality noted.   XR HIP UNILAT W OR W/O PELVIS 2-3 VIEWS LEFT   Result Date: 12/11/2022 2 views of the left hip including AP and lateral film were ordered and reviewed by myself.  X-ray shows   a femoral head well-seated within the acetabulum.  There is a well-preserved joint space with only minimal OA.  There is a small spur off the inferior aspect of the acetabulum.   PATIENT SURVEYS:  FOTO 66(74 predicted)  MUSCLE LENGTH: Hamstrings: Right 90 deg; Left 90 deg   POSTURE: No Significant postural limitations  PALPATION: TTP L greater trochanter  LOWER EXTREMITY ROM: WNL throuhout  Passive ROM Right eval Left eval  Hip  flexion    Hip extension    Hip abduction    Hip adduction    Hip internal rotation    Hip external rotation    Knee flexion    Knee extension    Ankle dorsiflexion    Ankle plantarflexion    Ankle inversion    Ankle eversion     (Blank rows = not tested)  LOWER EXTREMITY MMT:  MMT Right eval Left eval  Hip flexion 5 4-  Hip extension 5 4-  Hip abduction 5 4-  Hip adduction    Hip internal rotation    Hip external rotation    Knee flexion 5 5  Knee extension 5 5  Ankle dorsiflexion    Ankle plantarflexion    Ankle inversion    Ankle eversion     (Blank rows = not tested)  LOWER EXTREMITY SPECIAL TESTS:  Hip special tests: Patrick (FABER) test: positive , Trendelenburg test: negative, Ober's test: negative, and Piriformis test: negative  FUNCTIONAL TESTS:  5 times sit to stand: 16s arms crossed  GAIT: Distance walked: 75ftx2 Assistive device utilized: None Level of assistance: Complete Independence Comments: unremarkable   TODAY'S TREATMENT:    [facility] Adult PT Treatment:                                                DATE: 01/29/23 Therapeutic Exercise: Nustep L3 8 min Seated hamstring stretch L 30s x2 S/L clamshell RTB x15 BIL L QL stretch 30s x2 Bridge with ball 2x10 Bridge with clamshell RTB x15 Supine hip fallouts GTB 15x B, 15/15 unilaterally SLR with contralateral pilates ring push 2x10 BIL  [facility] Adult PT Treatment:                                                DATE: 01/21/23 Therapeutic Exercise: Nustep L2 8 min Seated hamstring stretch L 30s x2 L QL stretch 30s x2 Bridge with ball 15x Bridge against GTB 15x Supine hip fallouts GTB 15x B, 15/15 unilaterally L hip flexor stretch 30s x2 S/L L abduction in extension bias 15x L runners step 10# KB 6 in 15x  [facility] Adult PT Treatment:                                                DATE: 01/16/23 Therapeutic Exercise: Nustep level 5 x 5 mins Standing hip abduction/extension 2x10 each BIL Sidestepping  at counter RTB at ankles x 3 laps Step up with contralateral march 6" x10 BIL, with 5# KB x10 BIL Seated hamstring stretch 2x30" BIL Bridges with ball 2x10 Seated figure 4 piriformis stretch x30" BIL push/pull Supine   figure 4 piriformis stretch 2x30" BIL Supine ITB stretch with strap 2x30" Lt Supine QL stretch 2x30" BIL   PATIENT EDUCATION:  Education details: Discussed eval findings, rehab rationale and POC and patient is in agreement  Person educated: Patient Education method: Explanation Education comprehension: verbalized understanding and needs further education  HOME EXERCISE PROGRAM: Access Code: [email or web address] URL: [email or web address] Date: 01/21/2023 Prepared by: Burns Timson  Exercises - Supine Piriformis Stretch with Foot on Ground  - 2 x daily - 5 x weekly - 1 sets - 2 reps - 30s hold - Seated Table Hamstring Stretch  - 2 x daily - 5 x weekly - 1 sets - 2 reps - 30s hold - Clamshell  - 2 x daily - 5 x weekly - 1 sets - 15 reps - Sidelying Hip Abduction  - 2 x daily - 5 x weekly - 1 sets - 15 reps - Supine Bridge  - 2 x daily - 5 x weekly - 1 sets - 15 reps  ASSESSMENT:  CLINICAL IMPRESSION:  Patient presents to PT reporting no current pain and that she has been feeling much better. She states that the injection she received last week has been very helpful. She is waiting to hear about more approval from financial aid, but asks for more exercises to perform at home if she cannot get approval for a few weeks. Added to HEP today and discussed with patient not performing every exercise daily, but to select up to 5-6 each day to perform. Session today focused on proximal hip strengthening with increased resistance and difficulty today to good effect.    EVAL-Patient is a 47 y.o. female who was seen today for physical therapy evaluation and treatment for chronic L hip and lateral leg symptoms. Patient is TTP over greater trochanter and notes pain with ITB stretch.   Mild weakness in L hip abduction noted.  Some guarding noted throughout special testing.  Nerve tension signs negative.  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    OBJECTIVE IMPAIRMENTS: decreased activity tolerance, decreased knowledge of condition, decreased ROM, decreased strength, and pain.   ACTIVITY LIMITATIONS: sitting, standing, squatting, sleeping, and stairs  PERSONAL FACTORS: Fitness, Past/current experiences, Time since onset of injury/illness/exacerbation, and language barrier  are also affecting patient's functional outcome.   REHAB POTENTIAL: Good  CLINICAL DECISION MAKING: Stable/uncomplicated  EVALUATION COMPLEXITY: Low   GOALS: Goals reviewed with patient? No  SHORT TERM GOALS=LONG TERM GOALS: Target date: 02/05/2023   Patient to demonstrate independence in HEP  Baseline: [email or web address] Goal status: INITIAL  2.  Decrease pain to 2/10 Baseline: 4/10 Goal status: INITIAL  3.  Increase L hip strength to 4/5 Baseline: 4-/5 Goal status: INITIAL  4.  Increase FOTO score to 74 Baseline: 66 Goal status: INITIAL  5.  Decrease 5x STS time to <15s arms crossed Baseline: 16s arms crossed Goal status: INITIAL   PLAN:  PT FREQUENCY: 1-2x/week  PT DURATION: 4 weeks  PLANNED INTERVENTIONS: Therapeutic exercises, Therapeutic activity, Neuromuscular re-education, Balance training, Gait training, Patient/Family education, Self Care, Joint mobilization, Stair training, Dry Needling, Manual therapy, and Re-evaluation  PLAN FOR NEXT SESSION: HEP review and update, manual techniques as appropriate, aerobic tasks, ROM and flexibility activities, strengthening and PREs, TPDN, gait and balance training as needed     Tyshell Ramberg M Zeola Brys, PT 02/04/2023, 9:26 AM  

## 2023-02-25 ENCOUNTER — Ambulatory Visit: Payer: Self-pay | Attending: Sports Medicine

## 2023-02-25 ENCOUNTER — Ambulatory Visit (INDEPENDENT_AMBULATORY_CARE_PROVIDER_SITE_OTHER): Payer: Self-pay

## 2023-02-25 DIAGNOSIS — I8393 Asymptomatic varicose veins of bilateral lower extremities: Secondary | ICD-10-CM

## 2023-02-25 NOTE — Progress Notes (Signed)
Treated pt's BLE spider and small reticular veins with Asclera 1 %, administered with a 27g butterfly.  Patient received a total of 6 mL (60 mg) of Asclera 1%. Pt tolerated well. Easy access. She is aware she may want another treatment, as all her veins were not treated today. Spanish interpretor ipad was used the entire duration of her visit-Jennifer Noble [id number]. Pt was given post treatment care instructions verbally and on (Spanish written) handout. Her consent form was also translated in [place].    Photos: Yes.    Compression stockings applied: Yes.

## 2023-02-28 ENCOUNTER — Ambulatory Visit: Payer: Self-pay

## 2023-02-28 NOTE — Telephone Encounter (Signed)
Noted  

## 2023-02-28 NOTE — Telephone Encounter (Signed)
  Chief Complaint: Sore throat, difficulty swallowing, cough, itchy ears, pus on tonsils Symptoms: above Frequency: 1 week Pertinent Negatives: Patient denies fever Disposition: [] ED /[x] Urgent Care (no appt availability in office) / [] Appointment(In office/virtual)/ []  [facility]/ [] Home Care/ [] Refused Recommended Disposition /[] [facility]/ []  Follow-up with PCP Additional Notes: Pt has had above s/s of a week. No appts in office. Recommended UC. Pt would like to be contacted is there is a cancellation for today. Or if she could be worked in,    Reason for Disposition  [1] Pus on tonsils (back of throat) AND [2]  fever AND [3] swollen neck lymph nodes ("glands")  Answer Assessment - Initial Assessment Questions 1. ONSET: "When did the throat start hurting?" (Hours or days ago)      1 week 2. SEVERITY: "How bad is the sore throat?" (Scale 1-10; mild, moderate or severe)   - MILD (1-3):  Doesn't interfere with eating or normal activities.   - MODERATE (4-7): Interferes with eating some solids and normal activities.   - SEVERE (8-10):  Excruciating pain, interferes with most normal activities.   - SEVERE WITH DYSPHAGIA (10): Can't swallow liquids, drooling.     mild 3. STREP EXPOSURE: "Has there been any exposure to strep within the past week?" If Yes, ask: "What type of contact occurred?"      no 4.  VIRAL SYMPTOMS: "Are there any symptoms of a cold, such as a runny nose, cough, hoarse voice or red eyes?"      Cough, Ears are itchy,  5. FEVER: "Do you have a fever?" If Yes, ask: "What is your temperature, how was it measured, and when did it start?"     no 6. PUS ON THE TONSILS: "Is there pus on the tonsils in the back of your throat?"     Yes a little 7. OTHER SYMPTOMS: "Do you have any other symptoms?" (e.g., difficulty breathing, headache, rash)  Protocols used: Sore Throat-A-AH

## 2023-03-05 ENCOUNTER — Ambulatory Visit: Payer: Self-pay | Admitting: Sports Medicine

## 2023-03-21 ENCOUNTER — Ambulatory Visit (INDEPENDENT_AMBULATORY_CARE_PROVIDER_SITE_OTHER): Payer: Self-pay | Admitting: Sports Medicine

## 2023-03-21 DIAGNOSIS — G8929 Other chronic pain: Secondary | ICD-10-CM

## 2023-03-21 DIAGNOSIS — R29898 Other symptoms and signs involving the musculoskeletal system: Secondary | ICD-10-CM

## 2023-03-21 DIAGNOSIS — M5442 Lumbago with sciatica, left side: Secondary | ICD-10-CM

## 2023-03-21 DIAGNOSIS — M25552 Pain in left hip: Secondary | ICD-10-CM

## 2023-03-21 NOTE — Progress Notes (Signed)
Jennifer Noble - 47 y.o. female MRN [id number]  Date of birth: April 04, 1976  Office Visit Note: Visit Date: 03/21/2023 PCP: Claiborne Rigg, NP Referred by: Claiborne Rigg, NP  Subjective: Chief Complaint  Patient presents with   Left Hip - Pain   HPI: Jennifer Noble is a pleasant 47 y.o. female who presents today for follow-up of left lateral hip pain. The use of an in-person Spanish interpreter was used throughout the entirety of the visit today.   During last visit on 01/22/2023 we did proceed with greater trochanteric injection.  She is in formalized physical therapy and initially had a few sessions, but last month she had a procedure for varicose veins of the leg and per the patient and her physician did not want her to put stress on the leg or do physical therapy for 3 weeks.  She does state that after the injection her pain essentially completely resolved, although here over the last few weeks it is slowly started to return.  Has been having some lower back pain intermittently as well.  No numbness tingling or weakness of the legs.  Not requiring medication use.   Pertinent ROS were reviewed with the patient and found to be negative unless otherwise specified above in HPI.   Assessment & Plan: Visit Diagnoses:  1. Greater trochanteric pain syndrome of left lower extremity   2. Weakness of left hip   3. Chronic midline low back pain with left-sided sciatica    Plan: Discussed with Javana the nature of her lateral hip pain which seems to be a dysfunction of her lateral hip abductors.  Her pain completely resolved itself for temporary time after her greater trochanteric injection.  She unfortunately has not been able to do formal therapy or home exercises for the last month given her vein procedure and she still has weakness of the muscles today.  We will allow her to restart formalized physical therapy and I would like her to perform home exercises for her  hip strengthening and abduction at least once daily.  I am hoping they can address her low back as well with therapy as well.   Follow-up: Return in about 6 weeks (around 05/02/2023) for for L-lateral hip (30-min f/u).   Meds & Orders: No orders of the defined types were placed in this encounter.  No orders of the defined types were placed in this encounter.    Procedures: No procedures performed      Clinical History: No specialty comments available.  She reports that she has never smoked. She has never used smokeless tobacco.  Recent Labs    07/19/22 1109 12/05/22 1541  HGBA1C 6.7* 6.0*    Objective:   Vital Signs: LMP 02/27/2021 (Approximate)   Physical Exam  Gen: Well-appearing, in no acute distress; non-toxic CV: Well-perfused. Warm.  Resp: Breathing unlabored on room air; no wheezing. Psych: Fluid speech in conversation; appropriate affect; normal thought process Neuro: Sensation intact throughout. No gross coordination deficits.   Ortho Exam - Left hip: Very mild TTP over the greater trochanteric region and in the mid buttock but improved from prior visits.  There is still weakness with 4/5 strength with resisted hip abduction compared to 5/5 strength of the contralateral hip.  There is full range of motion about the hip active and passively without restriction.  No overlying swelling or redness.  - Lumbar: No midline spinous process TTP.  There is generalized hypertonicity left greater than right lumbar paraspinal muscles  at the lower lumbar spine.  There is full range of motion with flexion and extension.  Imaging:  - Hip XR 12/11/22:  2 views of the left hip including AP and lateral film were ordered and  reviewed by myself.  X-ray shows a femoral head well-seated within the  acetabulum.  There is a well-preserved joint space with only minimal OA.   There is a small spur off the inferior aspect of the acetabulum.   - Lumbar XR 12/11/22: 4 views of the lumbar spine  including AP, lateral and flexion and  extension views were ordered and reviewed by myself.  X-rays demonstrate  no notable scoliosis.  There is mild intervertebral disc space narrowing  between L5-S1 with mild DDD.  There is some facet arthropathy at the L5  region, otherwise no acute fracture or other bony abnormality noted.    Past Medical/Family/Surgical/Social History: Medications & Allergies reviewed per EMR, new medications updated. Patient Active Problem List   Diagnosis Date Noted   Vasomotor symptoms due to menopause 12/03/2022   Headache 05/07/2016   Depression 08/17/2015   Insomnia 08/17/2015   Elevated lipids 04/08/2013   Family history of early CAD 04/08/2013   Hemorrhoid    Dysuria    Past Medical History:  Diagnosis Date   Anxiety    Depression    Dysuria    Hemorrhoid    High cholesterol    Family History  Problem Relation Age of Onset   Diabetes Mother    Diabetes Sister    Diabetes Brother    Heart disease Other    Breast cancer Neg Hx    Past Surgical History:  Procedure Laterality Date   HEMORRHOID SURGERY     Social History   Occupational History   Not on file  Tobacco Use   Smoking status: Never   Smokeless tobacco: Never  Vaping Use   Vaping status: Never Used  Substance and Sexual Activity   Alcohol use: No   Drug use: No   Sexual activity: Yes    Birth control/protection: Post-menopausal

## 2023-03-28 ENCOUNTER — Other Ambulatory Visit: Payer: Self-pay

## 2023-03-28 ENCOUNTER — Encounter: Payer: Self-pay | Admitting: Nurse Practitioner

## 2023-03-28 ENCOUNTER — Ambulatory Visit: Payer: Self-pay | Attending: Nurse Practitioner | Admitting: Nurse Practitioner

## 2023-03-28 VITALS — BP 112/74 | HR 96 | Temp 99.4°F | Ht 62.5 in | Wt 164.8 lb

## 2023-03-28 DIAGNOSIS — E559 Vitamin D deficiency, unspecified: Secondary | ICD-10-CM

## 2023-03-28 DIAGNOSIS — R7303 Prediabetes: Secondary | ICD-10-CM

## 2023-03-28 DIAGNOSIS — J329 Chronic sinusitis, unspecified: Secondary | ICD-10-CM

## 2023-03-28 DIAGNOSIS — E785 Hyperlipidemia, unspecified: Secondary | ICD-10-CM

## 2023-03-28 DIAGNOSIS — B9689 Other specified bacterial agents as the cause of diseases classified elsewhere: Secondary | ICD-10-CM

## 2023-03-28 MED ORDER — AMOXICILLIN-POT CLAVULANATE 875-125 MG PO TABS
1.0000 | ORAL_TABLET | Freq: Two times a day (BID) | ORAL | 0 refills | Status: AC
  Filled 2023-03-28: qty 14, 7d supply, fill #0

## 2023-03-28 MED ORDER — FLUTICASONE PROPIONATE 50 MCG/ACT NA SUSP
2.0000 | Freq: Every day | NASAL | 0 refills | Status: DC
  Filled 2023-03-28: qty 16, 30d supply, fill #0

## 2023-03-28 NOTE — Progress Notes (Signed)
Assessment & Plan:  [name] was seen today for uri.  Diagnoses and all orders for this visit:  Bacterial sinusitis -     fluticasone (FLONASE) 50 MCG/ACT nasal spray; Place 2 sprays into both nostrils daily. -     amoxicillin-clavulanate (AUGMENTIN) 875-125 MG tablet; Take 1 tablet by mouth 2 (two) times daily for 7 days.  Prediabetes -     Hemoglobin A1c -     CMP14+EGFR  Vitamin D deficiency disease -     VITAMIN D 25 Hydroxy (Vit-D Deficiency, Fractures)  Dyslipidemia, goal LDL below 70 -     Lipid panel    Patient has been counseled on age-appropriate routine health concerns for screening and prevention. These are reviewed and up-to-date. Referrals have been placed accordingly. Immunizations are up-to-date or declined.    Subjective:   Chief Complaint  Patient presents with   URI   HPI Jennifer Noble Jennifer Noble 47 y.o. female presents to office today for URI symptoms of cough with sputum and itching, sore throat, frontal and maxillary headache, and bilateral ear pressure. She has been using vicks vapor rub.    VRI was used to communicate directly with patient for the entire encounter including providing detailed patient instructions.     She endorses dry feet and itching on her heels. Has not been using any emollients for moisture.     Review of Systems  Constitutional:  Negative for fever, malaise/fatigue and weight loss.  HENT:  Positive for congestion, sinus pain and sore throat. Negative for nosebleeds.   Eyes: Negative.  Negative for blurred vision, double vision and photophobia.  Respiratory:  Positive for cough. Negative for shortness of breath.   Cardiovascular: Negative.  Negative for chest pain, palpitations and leg swelling.  Gastrointestinal: Negative.  Negative for heartburn, nausea and vomiting.  Musculoskeletal: Negative.  Negative for myalgias.  Neurological:  Positive for headaches. Negative for dizziness, focal weakness and seizures.   Psychiatric/Behavioral: Negative.  Negative for suicidal ideas.     Past Medical History:  Diagnosis Date   Anxiety    Depression    Dysuria    Hemorrhoid    High cholesterol     Past Surgical History:  Procedure Laterality Date   HEMORRHOID SURGERY      Family History  Problem Relation Age of Onset   Diabetes Mother    Diabetes Sister    Diabetes Brother    Heart disease Other    Breast cancer Neg Hx     Social History Reviewed with no changes to be made today.   Outpatient Medications Prior to Visit  Medication Sig Dispense Refill   citalopram (CELEXA) 20 MG tablet Take 1 tablet (20 mg total) by mouth daily. 90 tablet 3   hydrOXYzine (ATARAX) 10 MG tablet Take 1 tablet (10 mg total) by mouth 3 (three) times daily as needed. (Patient not taking: Reported on 03/28/2023) 90 tablet 3   meloxicam (MOBIC) 15 MG tablet Take 1 tablet (15 mg total) by mouth daily. (Patient not taking: Reported on 03/28/2023) 30 tablet 0   methocarbamol (ROBAXIN) 500 MG tablet Take 1-2 tablets (500-1,000 mg total) by mouth every 8 (eight) hours as needed for muscle pain (Patient not taking: Reported on 03/28/2023) 90 tablet 0   methylPREDNISolone (MEDROL DOSEPAK) 4 MG TBPK tablet Take per packet instructions. Taper dosing. (Patient not taking: Reported on 03/28/2023) 21 each 0   No facility-administered medications prior to visit.    Allergies  Allergen Reactions   Naproxen  Nausea And Vomiting       Objective:    BP 112/74 (BP Location: Left Arm, Patient Position: Sitting, Cuff Size: Normal)   Pulse 96   Temp 99.4 F (37.4 C) (Oral)   Ht 5' 2.5" (1.588 m)   Wt 164 lb 12.8 oz (74.8 kg)   LMP 02/27/2021 (Approximate)   SpO2 98%   BMI 29.66 kg/m  Wt Readings from Last 3 Encounters:  03/28/23 164 lb 12.8 oz (74.8 kg)  01/22/23 163 lb 12.8 oz (74.3 kg)  12/05/22 162 lb (73.5 kg)    Physical Exam Vitals and nursing note reviewed.  Constitutional:      Appearance: She is well-developed.   HENT:     Head: Normocephalic and atraumatic.     Right Ear: A middle ear effusion is present.     Left Ear: A middle ear effusion is present.     Nose:     Right Turbinates: Enlarged.     Left Turbinates: Not pale.     Right Sinus: Maxillary sinus tenderness and frontal sinus tenderness present.     Mouth/Throat:     Mouth: No injury or oral lesions.     Dentition: Normal dentition.     Pharynx: Postnasal drip present. No pharyngeal swelling, oropharyngeal exudate, posterior oropharyngeal erythema or uvula swelling.  Cardiovascular:     Rate and Rhythm: Normal rate and regular rhythm.     Heart sounds: Normal heart sounds. No murmur heard.    No friction rub. No gallop.  Pulmonary:     Effort: Pulmonary effort is normal. No tachypnea or respiratory distress.     Breath sounds: Normal breath sounds. No decreased breath sounds, wheezing, rhonchi or rales.  Chest:     Chest wall: No tenderness.  Abdominal:     General: Bowel sounds are normal.     Palpations: Abdomen is soft.  Musculoskeletal:        General: Normal range of motion.     Cervical back: Normal range of motion.  Skin:    General: Skin is warm and dry.  Neurological:     Mental Status: She is alert and oriented to person, place, and time.     Coordination: Coordination normal.  Psychiatric:        Behavior: Behavior normal. Behavior is cooperative.        Thought Content: Thought content normal.        Judgment: Judgment normal.          Patient has been counseled extensively about nutrition and exercise as well as the importance of adherence with medications and regular follow-up. The patient was given clear instructions to go to ER or return to medical center if symptoms don't improve, worsen or new problems develop. The patient verbalized understanding.   Follow-up: Return if symptoms worsen or fail to improve.   Claiborne Rigg, FNP-BC [facility] [place],  [place] [phone]   03/28/2023, 11:27 AM

## 2023-03-28 NOTE — Progress Notes (Signed)
Patient states that she has been experiencing a scratchy throat and congestion for a week. Along with cough, which produces a lot of mucus.  No otc medicine have been taken.

## 2023-03-29 LAB — CMP14+EGFR
ALT: 40 IU/L — ABNORMAL HIGH (ref 0–32)
AST: 28 IU/L (ref 0–40)
Albumin: 4.6 g/dL (ref 3.9–4.9)
Alkaline Phosphatase: 94 IU/L (ref 44–121)
BUN/Creatinine Ratio: 18 (ref 9–23)
BUN: 11 mg/dL (ref 6–24)
Bilirubin Total: 0.5 mg/dL (ref 0.0–1.2)
CO2: 24 mmol/L (ref 20–29)
Calcium: 9.8 mg/dL (ref 8.7–10.2)
Chloride: 103 mmol/L (ref 96–106)
Creatinine, Ser: 0.61 mg/dL (ref 0.57–1.00)
Globulin, Total: 3.4 g/dL (ref 1.5–4.5)
Glucose: 106 mg/dL — ABNORMAL HIGH (ref 70–99)
Potassium: 4.2 mmol/L (ref 3.5–5.2)
Sodium: 141 mmol/L (ref 134–144)
Total Protein: 8 g/dL (ref 6.0–8.5)
eGFR: 112 mL/min/1.73 (ref >59)

## 2023-03-29 LAB — HEMOGLOBIN A1C
Est. average glucose Bld gHb Est-mCnc: 128 mg/dL
Hgb A1c MFr Bld: 6.1 % — ABNORMAL HIGH (ref 4.8–5.6)

## 2023-03-29 LAB — LIPID PANEL
Chol/HDL Ratio: 4.7 ratio — ABNORMAL HIGH (ref 0.0–4.4)
Cholesterol, Total: 222 mg/dL — ABNORMAL HIGH (ref 100–199)
HDL: 47 mg/dL (ref >39)
LDL Chol Calc (NIH): 148 mg/dL — ABNORMAL HIGH (ref 0–99)
Triglycerides: 149 mg/dL (ref 0–149)
VLDL Cholesterol Cal: 27 mg/dL (ref 5–40)

## 2023-03-29 LAB — VITAMIN D 25 HYDROXY (VIT D DEFICIENCY, FRACTURES): Vit D, 25-Hydroxy: 21 ng/mL — ABNORMAL LOW (ref 30.0–100.0)

## 2023-05-02 ENCOUNTER — Other Ambulatory Visit: Payer: Self-pay

## 2023-05-02 ENCOUNTER — Telehealth: Payer: Self-pay

## 2023-05-02 ENCOUNTER — Ambulatory Visit (INDEPENDENT_AMBULATORY_CARE_PROVIDER_SITE_OTHER): Payer: Self-pay | Admitting: Sports Medicine

## 2023-05-02 DIAGNOSIS — M25552 Pain in left hip: Secondary | ICD-10-CM

## 2023-05-02 DIAGNOSIS — R29898 Other symptoms and signs involving the musculoskeletal system: Secondary | ICD-10-CM

## 2023-05-02 MED ORDER — BETAMETHASONE SOD PHOS & ACET 6 (3-3) MG/ML IJ SUSP
6.0000 mg | INTRAMUSCULAR | Status: AC | PRN
  Administered 2023-05-02: 6 mg via INTRA_ARTICULAR

## 2023-05-02 MED ORDER — LIDOCAINE HCL 1 % IJ SOLN
2.0000 mL | INTRAMUSCULAR | Status: AC | PRN
  Administered 2023-05-02: 2 mL

## 2023-05-02 MED ORDER — BUPIVACAINE HCL 0.25 % IJ SOLN
2.0000 mL | INTRAMUSCULAR | Status: AC | PRN
  Administered 2023-05-02: 2 mL via INTRA_ARTICULAR

## 2023-05-02 NOTE — Progress Notes (Signed)
Jennifer Noble - 47 y.o. female MRN [id number]  Date of birth: May 07, 1976  Office Visit Note: Visit Date: 05/02/2023 PCP: Claiborne Rigg, NP Referred by: Claiborne Rigg, NP  Subjective: Chief Complaint  Patient presents with   Left Hip - Pain   HPI: Jennifer Noble is a pleasant 47 y.o. female who presents today for  follow-up of left lateral hip pain. The use of an in-person Spanish interpreter was used throughout the entirety of the visit today.   She continues with pain over the lateral hip, sometimes into the buttock as well.  The pain will radiate down the left lateral leg.  However she denies any numbness tingling or gross weakness of the leg.  Due to an insurance change she was unable to have any formalized physical therapy and is requesting a new referral today.  She is trying to do some of the home exercises at home.  She is not taking any medication consistently for this.  Pertinent ROS were reviewed with the patient and found to be negative unless otherwise specified above in HPI.   Assessment & Plan: Visit Diagnoses:  1. Greater trochanteric pain syndrome of left lower extremity   2. Weakness of left hip    Plan: Jennifer Noble having an exacerbation of her greater trochanteric pain syndrome which seems to be emanating from her dysfunction and weakness of her lateral hip abductors.  She still has weakness on the left side compared to the right side which is full strength.  We discussed the importance of getting her into formalized physical therapy to work on strengthening the hips as she has symmetry compared to the contralateral hip, or otherwise her pain will continue to return.  The pain is rather bothersome her today so through shared decision making to proceed with ultrasound-guided greater trochanteric injection.  She will start formalized physical therapy and will continue her HEP daily.  May use over-the-counter anti-inflammatories, ice or heat as  needed.  I would like to see her back in about 6 weeks after starting physical therapy to reassess her strength and see how her pain is doing.  If for some reason she cannot improve her strength with consistent physical therapy, next step would likely be obtaining an MRI of the hip to evaluate the quality of the gluteal tendons.   Follow-up: Return in about 6 weeks (around 06/13/2023) for for R-lat hip.   Meds & Orders: No orders of the defined types were placed in this encounter.   Orders Placed This Encounter  Procedures   Large Joint Inj     Procedures: Large Joint Inj: L greater trochanter on 05/02/2023 10:54 AM Indications: pain Details: 22 G 3.5 in needle, ultrasound-guided lateral approach Medications: 2 mL lidocaine 1 %; 2 mL bupivacaine 0.25 %; 6 mg betamethasone acetate-betamethasone sodium phosphate 6 (3-3) MG/ML Outcome: tolerated well, no immediate complications  US-Guided Greater Trochanteric Bursa Injection, Left After discussion on risks/benefits/indications and informed verbal consent was obtained, a timeout was performed. The patient was lying in lateral recumbent position on exam table. Using ultrasound guidance, the greater trochanter was identified. The area overlying the trochanteric bursa was then prepped with Betadine and alcohol swabs. Following sterile precautions, ultrasound was reapplied to visualize needle guidance with a 22-gauge 3.5" needle utilizing an in-plane approach to inject the bursa with 2:2:1 lidocaine:bupivicaine:betamethasone. Delivery of the injectate was visualized into the region of hypoechoic fluid of the greater trochanteric bursa. Patient tolerated procedure well without immediate complications.  Procedure, treatment alternatives, risks and benefits explained, specific risks discussed. Consent was given by the patient. Immediately prior to procedure a time out was called to verify the correct patient, procedure, equipment, support staff and  site/side marked as required. Patient was prepped and draped in the usual sterile fashion.          Clinical History: No specialty comments available.  She reports that she has never smoked. She has never used smokeless tobacco.  Recent Labs    07/19/22 1109 12/05/22 1541 03/28/23 1141  HGBA1C 6.7* 6.0* 6.1*    Objective:   Vital Signs: LMP 02/27/2021 (Approximate)   Physical Exam  Gen: Well-appearing, in no acute distress; non-toxic CV: Well-perfused. Warm.  Resp: Breathing unlabored on room air; no wheezing. Psych: Fluid speech in conversation; appropriate affect; normal thought process Neuro: Sensation intact throughout. No gross coordination deficits.   Ortho Exam - Left hip: Positive TTP over the posterior aspect of the greater trochanter.  There is no redness or swelling here.  There is no restriction with internal or external passive logroll.  She has full range of motion about the hip.  There is 4/5 strength with weakness with resisted hip abduction on the left compared to full strength on the contralateral side. Negative straight leg raise, full range of motion of the lumbar spine.  Imaging:  *Previous imaging:  - Hip XR: 2 views of the left hip including AP and lateral film were ordered and  reviewed by myself.  X-ray shows a femoral head well-seated within the  acetabulum.  There is a well-preserved joint space with only minimal OA.   There is a small spur off the inferior aspect of the acetabulum.   - Lumbar XR: 4 views of the lumbar spine including AP, lateral and flexion and  extension views were ordered and reviewed by myself.  X-rays demonstrate  no notable scoliosis.  There is mild intervertebral disc space narrowing  between L5-S1 with mild DDD.  There is some facet arthropathy at the L5  region, otherwise no acute fracture or other bony abnormality noted.   Past Medical/Family/Surgical/Social History: Medications & Allergies reviewed per EMR, new  medications updated. Patient Active Problem List   Diagnosis Date Noted   Vasomotor symptoms due to menopause 12/03/2022   Headache 05/07/2016   Depression 08/17/2015   Insomnia 08/17/2015   Elevated lipids 04/08/2013   Family history of early CAD 04/08/2013   Hemorrhoid    Dysuria    Past Medical History:  Diagnosis Date   Anxiety    Depression    Dysuria    Hemorrhoid    High cholesterol    Family History  Problem Relation Age of Onset   Diabetes Mother    Diabetes Sister    Diabetes Brother    Heart disease Other    Breast cancer Neg Hx    Past Surgical History:  Procedure Laterality Date   HEMORRHOID SURGERY     Social History   Occupational History   Not on file  Tobacco Use   Smoking status: Never   Smokeless tobacco: Never  Vaping Use   Vaping status: Never Used  Substance and Sexual Activity   Alcohol use: No   Drug use: No   Sexual activity: Yes    Birth control/protection: Post-menopausal

## 2023-05-02 NOTE — Telephone Encounter (Signed)
Patient in the office today inquiring about recent lab results. Per PCP interpretation from labs drawn on 03/28/2023 . A1c continues in prediabetes range.   Vit D is minimally low. You can take over the counter vitamin D 5000 units daily to help improve this.     Kidney, liver function and electrolytes are essentially normal.  Liver enzymes have improved.  Diet should be low in fat and cholesterol and should be exercising at least 4-5 times per week 45 minutes.   Cholesterol levels are elevated.  Increases your risk of heart attack or stroke.  Again diet should be low in fat and cholesterol.  You can take omega-3 fish on over-the-counter to help reduce your risk. Patient voiced understanding of all discussed .

## 2023-05-02 NOTE — Progress Notes (Signed)
Patient said that she has not gotten into physical therapy as her insurance expired. She has gotten the insurance sorted but will need a new referral. She says that she is having more pain now in the back of her hip that is worse above the knee but does go down to her foot. She says that she has been doing home exercises, but that her pain from her previous visit feels the same now as it did then.

## 2023-05-16 ENCOUNTER — Ambulatory Visit: Payer: Self-pay | Admitting: Physical Therapy

## 2023-05-29 NOTE — Therapy (Signed)
OUTPATIENT PHYSICAL THERAPY LOWER EXTREMITY EVALUATION   Patient Name: Jennifer Noble MRN: [id number] DOB:1976-06-15, 47 y.o., female Today's Date: 05/30/2023  END OF SESSION:  PT End of Session - 05/30/23 0935     Visit Number 1    Number of Visits 9    Date for PT Re-Evaluation 07/25/23    Authorization Type CAFA    PT Start Time [phone]   late check in   PT Stop Time 1012    PT Time Calculation (min) 35 min    Activity Tolerance Patient tolerated treatment well;No increased pain    Behavior During Therapy [facility] for tasks assessed/performed             Past Medical History:  Diagnosis Date   Anxiety    Depression    Dysuria    Hemorrhoid    High cholesterol    Past Surgical History:  Procedure Laterality Date   HEMORRHOID SURGERY     Patient Active Problem List   Diagnosis Date Noted   Vasomotor symptoms due to menopause 12/03/2022   Headache 05/07/2016   Depression 08/17/2015   Insomnia 08/17/2015   Elevated lipids 04/08/2013   Family history of early CAD 04/08/2013   Hemorrhoid    Dysuria     PCP: Claiborne Rigg, NP  REFERRING PROVIDER: Madelyn Brunner, DO  REFERRING DIAG: [phone] (ICD-10-CM) - Greater trochanteric pain syndrome of left lower extremity R29.898 (ICD-10-CM) - Weakness of left hip  THERAPY DIAG:  Pain in left hip  Muscle weakness (generalized)  Rationale for Evaluation and Treatment: Rehabilitation  ONSET DATE: over 2 years  SUBJECTIVE:   SUBJECTIVE STATEMENT: Appreciate assistance of video interpreter.  States previous bout of PT did not seem very helpful. Has had hip pain for over 2 years ago, denies any injuries but did occur fairly suddenly. States symptoms are not worsening but are not changing. Occasional tingling down lateral limb which mostly occurs at night when lying on L side. She states most of her pain is when first standing/walking after sitting, particularly for prolonged periods.   PERTINENT HISTORY:   anxiety/depression, headaches PAIN:  Are you having pain: "very little", 2-3/10 Location/description: L hip, lateral thigh; at night sometimes goes laterally to foot described as tingling Best-worst over past week: 2-7/10 - aggravating factors: prolonged sitting or driving, transfers subsequently, lying down at night (lying on L side), walking after prolonged sitting - Easing factors: difficulty describing    PRECAUTIONS: None  WEIGHT BEARING RESTRICTIONS: No  FALLS:  Has patient fallen in last 6 months? No  LIVING ENVIRONMENT: Mobile home, 4STE w/ BIL rails Lives w/ spouse and son  OCCUPATION: homemaker/caregiver  PLOF: Independent  PATIENT GOALS: eliminate pain - pt states she is concerned with chronicity, and wants to avoid symptoms getting worse  NEXT MD VISIT: 06/13/23 Dr. Shon Baton  OBJECTIVE:  Note: Objective measures were completed at Evaluation unless otherwise noted.  DIAGNOSTIC FINDINGS:  12/11/22 L hip XR (refer to EPIC for details) Per results narrative: "2 views of the left hip including AP and lateral film were ordered and  reviewed by myself.  X-ray shows a femoral head well-seated within the  acetabulum.  There is a well-preserved joint space with only minimal OA.   There is a small spur off the inferior aspect of the acetabulum. "  12/11/22 Lumbar XR (refer to EPIC for details) Per results narrative: "4 views of the lumbar spine including AP, lateral and flexion and  extension views were ordered and  reviewed by myself.  X-rays demonstrate  no notable scoliosis.  There is mild intervertebral disc space narrowing  between L5-S1 with mild DDD.  There is some facet arthropathy at the L5  region, otherwise no acute fracture or other bony abnormality noted. "   PATIENT SURVEYS:  FOTO late check in - requested pt perform with front office after session. Follow up at next treatment if able  COGNITION: Overall cognitive status: Within functional limits for tasks  assessed     SENSATION: Light touch intact BIL LE    PALPATION: Deferred given time constraints  LOWER EXTREMITY ROM:     Active  Right eval Left eval  Hip flexion    Hip extension    Hip internal rotation    Hip external rotation    Knee extension    Knee flexion    (Blank rows = not tested) (Key: WFL = within functional limits not formally assessed, * = concordant pain, s = stiffness/stretching sensation, NT = not tested)  Comments:    LOWER EXTREMITY MMT:    MMT Right eval Left eval  Hip flexion 5 4- *  Hip abduction (modified sitting) 4 4  Hip internal rotation 5 5  Hip external rotation 5 4- *  Knee flexion 5 5  Knee extension 5 4+ *  Ankle dorsiflexion 5 5   (Blank rows = not tested) (Key: WFL = within functional limits not formally assessed, * = concordant pain, s = stiffness/stretching sensation, NT = not tested)  Comments:    LOWER EXTREMITY SPECIAL TESTS:  Deferred given time constraints   FUNCTIONAL TESTS:  30secSTS: 11 reps, fatigue, gentle UE support from thighs, in standard chair   GAIT: Distance walked: within clinic Assistive device utilized: None Level of assistance: Complete Independence Comments: grossly WNL    TODAY'S TREATMENT:                                                                                                                              [facility] Adult PT Treatment:                                                DATE: 05/30/23 Therapeutic Exercise: Bridge x8 Hooklying march x8 HEP handout + education    PATIENT EDUCATION:  Education details: Pt education on PT impairments, prognosis, and POC. Informed consent. Rationale for interventions, safe/appropriate HEP performance Person educated: Patient with assistance of interpreter Education method: Explanation, Demonstration, Tactile cues, Verbal cues, and Handouts Education comprehension: verbalized understanding, returned demonstration, verbal cues required, tactile cues  required, and needs further education    HOME EXERCISE PROGRAM: Access Code: [email or web address] URL: [email or web address] Date: 05/30/2023 Prepared by: Fransisco Hertz  Exercises - Supine Bridge  - 2-3 x daily - 7 x weekly - 1 sets - 8 reps - Supine  March  - 2-3 x daily - 7 x weekly - 1 sets - 8 reps  ASSESSMENT:  CLINICAL IMPRESSION: Patient is a pleasant 47 y.o. woman who was seen today for physical therapy evaluation and treatment for L hip pain/weakness that has been ongoing for over 2 years per her report - describes as non-worsening, but mostly occurs when standing/walking after prolonged sitting. Also has some lateral LLE tingling when lying directly on L side. Today's examination is shortened due to time constraints, but pt demonstrates limitations in L LE hip/knee strength, both of which provoke concordant pain. She also demonstrates reduced exercise/activity tolerance as evidenced by 30sec STS score. Tolerates exam/HEP well overall, no adverse events. Time is spent w/ education on exam findings, rationale for interventions, and initial plan for course of care - pt verbalizes agreement/understanding. Appreciate assistance of video interpreter throughout. Recommend trial of skilled PT to address aforementioned deficits with aim of improving functional tolerance and reducing pain with typical activities. Pt departs today's session in no acute distress, all voiced concerns/questions addressed appropriately from PT perspective.      OBJECTIVE IMPAIRMENTS: decreased activity tolerance, difficulty walking, decreased strength, and pain.   ACTIVITY LIMITATIONS: sitting, standing, transfers, and locomotion level  PARTICIPATION LIMITATIONS: driving, community activity, and occupation  PERSONAL FACTORS: Time since onset of injury/illness/exacerbation are also affecting patient's functional outcome.   REHAB POTENTIAL: Fair given chronicity and prior bout of PT for same episode  CLINICAL  DECISION MAKING: Stable/uncomplicated  EVALUATION COMPLEXITY: Low   GOALS: Goals reviewed with patient? Yes  SHORT TERM GOALS: Target date: 06/27/2023 Pt will demonstrate appropriate understanding and performance of initially prescribed HEP in order to facilitate improved independence with management of symptoms.  Baseline: HEP provided on eval Goal status: INITIAL   2. Pt will improve at least MCID on FOTO in order to demonstrate improved perception of function due to symptoms.  Baseline: FOTO TBD  Goal status: INITIAL    LONG TERM GOALS: Target date: 07/25/2023 Pt will meet predicted score or greater on FOTO in order to demonstrate improved perception of function due to symptoms.  Baseline: FOTO TBD Goal status: INITIAL  2.  Pt will demonstrate at least 4+/5 throughout LLE hip/knee MMT for improved functional strength. Baseline: see MMT chart above Goal status: INITIAL  3. Pt will be able to perform at least 16 repetitions during 30 second sit to stand test in order to demonstrate improved exercise/activity tolerance (cutoff score for low exercise tolerance 18 repetitions in males and 16 repetitions in females per Georgina Snell al 2024)  Baseline: 11 repetitions, gentle UE support from thighs  Goal status: INITIAL    4. Pt will demonstrate appropriate performance of final prescribed HEP in order to facilitate improved self-management of symptoms post-discharge.   Baseline: initial HEP prescribed  Goal status: INITIAL    5. Pt will report at least 50% decrease in overall pain levels in past week in order to facilitate improved tolerance to basic ADLs/mobility.   Baseline: 2-7/10  Goal status: INITIAL     PLAN:  PT FREQUENCY: 1x/week  PT DURATION: 8 weeks  PLANNED INTERVENTIONS: [id number]- PT Re-evaluation, 97110-Therapeutic exercises, 97530- Therapeutic activity, [id number]- Neuromuscular re-education, 97535- Self Care, [id number]- Manual therapy, [id number]- Gait training, and [id number]- Aquatic  Therapy  PLAN FOR NEXT SESSION: Review/update HEP PRN. Work on [employer] exercises as appropriate with emphasis on rotational hip strength and hip flexor strength. Symptom modification strategies as indicated/appropriate. Consider looking further at hip mobility, lumbar screen. Check in  re: Breck Coons PT, DPT 05/30/2023 10:28 AM

## 2023-05-30 ENCOUNTER — Encounter: Payer: Self-pay | Admitting: Physical Therapy

## 2023-05-30 ENCOUNTER — Ambulatory Visit: Payer: Self-pay | Admitting: Physical Therapy

## 2023-05-30 ENCOUNTER — Ambulatory Visit (INDEPENDENT_AMBULATORY_CARE_PROVIDER_SITE_OTHER): Payer: Self-pay | Admitting: Physical Therapy

## 2023-05-30 ENCOUNTER — Other Ambulatory Visit: Payer: Self-pay

## 2023-05-30 DIAGNOSIS — M25552 Pain in left hip: Secondary | ICD-10-CM

## 2023-05-30 DIAGNOSIS — M6281 Muscle weakness (generalized): Secondary | ICD-10-CM

## 2023-06-06 ENCOUNTER — Ambulatory Visit (INDEPENDENT_AMBULATORY_CARE_PROVIDER_SITE_OTHER): Payer: Self-pay | Admitting: Physical Therapy

## 2023-06-06 ENCOUNTER — Encounter: Payer: Self-pay | Admitting: Physical Therapy

## 2023-06-06 DIAGNOSIS — M6281 Muscle weakness (generalized): Secondary | ICD-10-CM

## 2023-06-06 DIAGNOSIS — M25552 Pain in left hip: Secondary | ICD-10-CM

## 2023-06-06 NOTE — Therapy (Signed)
OUTPATIENT PHYSICAL THERAPY TREATMENT NOTE   Patient Name: Jennifer Noble MRN: [id number] DOB:25-Nov-1975, 47 y.o., female Today's Date: 06/06/2023  END OF SESSION:  PT End of Session - 06/06/23 1456     Visit Number 2    Number of Visits 9    Date for PT Re-Evaluation 07/25/23    Authorization Type CAFA    PT Start Time 1505    PT Stop Time 1548    PT Time Calculation (min) 43 min    Activity Tolerance Patient tolerated treatment well;No increased pain    Behavior During Therapy [facility] for tasks assessed/performed              Past Medical History:  Diagnosis Date   Anxiety    Depression    Dysuria    Hemorrhoid    High cholesterol    Past Surgical History:  Procedure Laterality Date   HEMORRHOID SURGERY     Patient Active Problem List   Diagnosis Date Noted   Vasomotor symptoms due to menopause 12/03/2022   Headache 05/07/2016   Depression 08/17/2015   Insomnia 08/17/2015   Elevated lipids 04/08/2013   Family history of early CAD 04/08/2013   Hemorrhoid    Dysuria     PCP: Claiborne Rigg, NP  REFERRING PROVIDER: Madelyn Brunner, DO  REFERRING DIAG: [phone] (ICD-10-CM) - Greater trochanteric pain syndrome of left lower extremity R29.898 (ICD-10-CM) - Weakness of left hip  THERAPY DIAG:  Pain in left hip  Muscle weakness (generalized)  Rationale for Evaluation and Treatment: Rehabilitation  ONSET DATE: over 2 years  SUBJECTIVE:  Per eval - Appreciate assistance of video interpreter.  States previous bout of PT did not seem very helpful. Has had hip pain for over 2 years ago, denies any injuries but did occur fairly suddenly. States symptoms are not worsening but are not changing. Occasional tingling down lateral limb which mostly occurs at night when lying on L side. She states most of her pain is when first standing/walking after sitting, particularly for prolonged periods.   SUBJECTIVE STATEMENT: 06/06/2023 Pt arrives w/ report of no  pain. Notes no significant changes after eval and good adherence with HEP Appreciate assistance of in person interpreter.   PERTINENT HISTORY:  anxiety/depression, headaches PAIN:  Are you having pain: no pain   Per eval -  Location/description: L hip, lateral thigh; at night sometimes goes laterally to foot described as tingling Best-worst over past week: 2-7/10 - aggravating factors: prolonged sitting or driving, transfers subsequently, lying down at night (lying on L side), walking after prolonged sitting - Easing factors: difficulty describing    PRECAUTIONS: None  WEIGHT BEARING RESTRICTIONS: No  FALLS:  Has patient fallen in last 6 months? No  LIVING ENVIRONMENT: Mobile home, 4STE w/ BIL rails Lives w/ spouse and son  OCCUPATION: homemaker/caregiver  PLOF: Independent  PATIENT GOALS: eliminate pain - pt states she is concerned with chronicity, and wants to avoid symptoms getting worse  NEXT MD VISIT: 06/13/23 Dr. Shon Baton  OBJECTIVE:  Note: Objective measures were completed at Evaluation unless otherwise noted.  DIAGNOSTIC FINDINGS:  12/11/22 L hip XR (refer to EPIC for details) Per results narrative: "2 views of the left hip including AP and lateral film were ordered and  reviewed by myself.  X-ray shows a femoral head well-seated within the  acetabulum.  There is a well-preserved joint space with only minimal OA.   There is a small spur off the inferior aspect of the acetabulum. "  12/11/22 Lumbar XR (refer to EPIC for details) Per results narrative: "4 views of the lumbar spine including AP, lateral and flexion and  extension views were ordered and reviewed by myself.  X-rays demonstrate  no notable scoliosis.  There is mild intervertebral disc space narrowing  between L5-S1 with mild DDD.  There is some facet arthropathy at the L5  region, otherwise no acute fracture or other bony abnormality noted. "   PATIENT SURVEYS:  FOTO late check in - requested pt  perform with front office after session. Follow up at next treatment if able FOTO from 05/30/23: 49 current, 58 predicted  COGNITION: Overall cognitive status: Within functional limits for tasks assessed     SENSATION: Light touch intact BIL LE    PALPATION: Deferred given time constraints  LOWER EXTREMITY ROM:     Active  Right eval Left eval  Hip flexion    Hip extension    Hip internal rotation    Hip external rotation    Knee extension    Knee flexion    (Blank rows = not tested) (Key: WFL = within functional limits not formally assessed, * = concordant pain, s = stiffness/stretching sensation, NT = not tested)  Comments:    LOWER EXTREMITY MMT:    MMT Right eval Left eval  Hip flexion 5 4- *  Hip abduction (modified sitting) 4 4  Hip internal rotation 5 5  Hip external rotation 5 4- *  Knee flexion 5 5  Knee extension 5 4+ *  Ankle dorsiflexion 5 5   (Blank rows = not tested) (Key: WFL = within functional limits not formally assessed, * = concordant pain, s = stiffness/stretching sensation, NT = not tested)  Comments:    LOWER EXTREMITY SPECIAL TESTS:  Deferred given time constraints   FUNCTIONAL TESTS:  30secSTS: 11 reps, fatigue, gentle UE support from thighs, in standard chair   GAIT: Distance walked: within clinic Assistive device utilized: None Level of assistance: Complete Independence Comments: grossly WNL    TODAY'S TREATMENT:                                                                                                                              [facility] Adult PT Treatment:                                                DATE: 06/06/23 Therapeutic Exercise: Hooklying march 2x10 BIL alternating, cues for reduced velocity  Bridge 2x10  Seated green band ER w/ ball as fulcrum 2x15 cues for form and home setup Seated green band IR w/ ball as fulcrum 2x15 cues for form and home setup Seated triple extension into fixed ball 2x8 cues for quad  contraction and increased push STS w/ green band at knees 2x10, from slightly raised mat  Green band 45 deg kickbacks 2x10 BIL  Standing triple flex<>triple extension w/ counter support 2x8 BIL cues for reduced compensations, posture Kickstand paloff press green band 2x8, ER bias cues for form, posture, and reduced compensations at knee  HEP update + education/handout    [facility] Adult PT Treatment:                                                DATE: 05/30/23 Therapeutic Exercise: Bridge x8 Hooklying march x8 HEP handout + education    PATIENT EDUCATION:  Education details: rationale for intervention, HEP  Person educated: Patient with assistance of interpreter Education method: Explanation, Demonstration, Tactile cues, Verbal cues, and Handouts Education comprehension: verbalized understanding, returned demonstration, verbal cues required, tactile cues required, and needs further education    HOME EXERCISE PROGRAM: Access Code: [email or web address] URL: [email or web address] Date: 06/06/2023 Prepared by: Fransisco Hertz  Program Notes - con rotacion internal, por favor cambia la banda y la pelota para trabajar la rotacion external tambien   Exercises - Supine Bridge  - 2-3 x daily - 7 x weekly - 1 sets - 8 reps - Supine March  - 2-3 x daily - 7 x weekly - 1 sets - 8 reps - Seated Hip Internal Rotation with Ball and Resistance  - 2-3 x daily - 7 x weekly - 1 sets - 8 reps  ASSESSMENT:  CLINICAL IMPRESSION: 06/06/2023 Pt arrives w/ report of no resting pain but denies any significant change in symptoms since eval. Today pt does well with HEP review, no issues. Able to expand program to include direct core strengthening and rotational hip strength. Cues as above, pt tolerates well overall with report of muscular fatigue but no pain. HEP update as above. Recommend continuing along current POC in order to address relevant deficits and improve functional tolerance. Pt departs today's  session in no acute distress, all voiced questions/concerns addressed appropriately from PT perspective.    Per eval - Patient is a pleasant 47 y.o. woman who was seen today for physical therapy evaluation and treatment for L hip pain/weakness that has been ongoing for over 2 years per her report - describes as non-worsening, but mostly occurs when standing/walking after prolonged sitting. Also has some lateral LLE tingling when lying directly on L side. Today's examination is shortened due to time constraints, but pt demonstrates limitations in L LE hip/knee strength, both of which provoke concordant pain. She also demonstrates reduced exercise/activity tolerance as evidenced by 30sec STS score. Tolerates exam/HEP well overall, no adverse events. Time is spent w/ education on exam findings, rationale for interventions, and initial plan for course of care - pt verbalizes agreement/understanding. Appreciate assistance of video interpreter throughout. Recommend trial of skilled PT to address aforementioned deficits with aim of improving functional tolerance and reducing pain with typical activities. Pt departs today's session in no acute distress, all voiced concerns/questions addressed appropriately from PT perspective.      OBJECTIVE IMPAIRMENTS: decreased activity tolerance, difficulty walking, decreased strength, and pain.   ACTIVITY LIMITATIONS: sitting, standing, transfers, and locomotion level  PARTICIPATION LIMITATIONS: driving, community activity, and occupation  PERSONAL FACTORS: Time since onset of injury/illness/exacerbation are also affecting patient's functional outcome.   REHAB POTENTIAL: Fair given chronicity and prior bout of PT for same episode  CLINICAL DECISION MAKING: Stable/uncomplicated  EVALUATION COMPLEXITY: Low   GOALS: Goals reviewed  with patient? Yes  SHORT TERM GOALS: Target date: 06/27/2023 Pt will demonstrate appropriate understanding and performance of initially  prescribed HEP in order to facilitate improved independence with management of symptoms.  Baseline: HEP provided on eval Goal status: INITIAL   2. Pt will improve at least MCID on FOTO in order to demonstrate improved perception of function due to symptoms.  Baseline: FOTO TBD  Goal status: INITIAL    LONG TERM GOALS: Target date: 07/25/2023 Pt will meet predicted score or greater on FOTO in order to demonstrate improved perception of function due to symptoms.  Baseline: FOTO TBD Goal status: INITIAL  2.  Pt will demonstrate at least 4+/5 throughout LLE hip/knee MMT for improved functional strength. Baseline: see MMT chart above Goal status: INITIAL  3. Pt will be able to perform at least 16 repetitions during 30 second sit to stand test in order to demonstrate improved exercise/activity tolerance (cutoff score for low exercise tolerance 18 repetitions in males and 16 repetitions in females per Georgina Snell al 2024)  Baseline: 11 repetitions, gentle UE support from thighs  Goal status: INITIAL    4. Pt will demonstrate appropriate performance of final prescribed HEP in order to facilitate improved self-management of symptoms post-discharge.   Baseline: initial HEP prescribed  Goal status: INITIAL    5. Pt will report at least 50% decrease in overall pain levels in past week in order to facilitate improved tolerance to basic ADLs/mobility.   Baseline: 2-7/10  Goal status: INITIAL     PLAN:  PT FREQUENCY: 1x/week  PT DURATION: 8 weeks  PLANNED INTERVENTIONS: 97146- PT Re-evaluation, 97110-Therapeutic exercises, 97530- Therapeutic activity, [id number]- Neuromuscular re-education, 97535- Self Care, 16109- Manual therapy, [id number]- Gait training, and [id number]- Aquatic Therapy  PLAN FOR NEXT SESSION: Review/update HEP PRN. Work on [employer] exercises as appropriate with emphasis on rotational hip strength and hip flexor strength. Symptom modification strategies as  indicated/appropriate.   Ashley Murrain PT, DPT 06/06/2023 3:59 PM

## 2023-06-13 ENCOUNTER — Ambulatory Visit (INDEPENDENT_AMBULATORY_CARE_PROVIDER_SITE_OTHER): Payer: Self-pay | Admitting: Rehabilitative and Restorative Service Providers"

## 2023-06-13 ENCOUNTER — Ambulatory Visit: Payer: Self-pay | Admitting: Sports Medicine

## 2023-06-13 ENCOUNTER — Encounter: Payer: Self-pay | Admitting: Rehabilitative and Restorative Service Providers"

## 2023-06-13 DIAGNOSIS — M6281 Muscle weakness (generalized): Secondary | ICD-10-CM

## 2023-06-13 DIAGNOSIS — M25552 Pain in left hip: Secondary | ICD-10-CM

## 2023-06-13 DIAGNOSIS — M7062 Trochanteric bursitis, left hip: Secondary | ICD-10-CM

## 2023-06-13 NOTE — Therapy (Signed)
OUTPATIENT PHYSICAL THERAPY TREATMENT NOTE   Patient Name: Jennifer Noble MRN: [id number] DOB:17-Jul-1976, 47 y.o., female Today's Date: 06/13/2023  END OF SESSION:  PT End of Session - 06/13/23 1027     Visit Number 3    Number of Visits 9    Date for PT Re-Evaluation 07/25/23    Authorization Type CAFA    PT Start Time 1024    PT Stop Time 1053    PT Time Calculation (min) 29 min    Activity Tolerance Patient tolerated treatment well    Behavior During Therapy [facility] for tasks assessed/performed               Past Medical History:  Diagnosis Date   Anxiety    Depression    Dysuria    Hemorrhoid    High cholesterol    Past Surgical History:  Procedure Laterality Date   HEMORRHOID SURGERY     Patient Active Problem List   Diagnosis Date Noted   Vasomotor symptoms due to menopause 12/03/2022   Headache 05/07/2016   Depression 08/17/2015   Insomnia 08/17/2015   Elevated lipids 04/08/2013   Family history of early CAD 04/08/2013   Hemorrhoid    Dysuria     PCP: Claiborne Rigg, NP  REFERRING PROVIDER: Madelyn Brunner, DO  REFERRING DIAG: [phone] (ICD-10-CM) - Greater trochanteric pain syndrome of left lower extremity R29.898 (ICD-10-CM) - Weakness of left hip  THERAPY DIAG:  Pain in left hip  Muscle weakness (generalized)  Trochanteric bursitis of left hip  Rationale for Evaluation and Treatment: Rehabilitation  ONSET DATE: over 2 years  SUBJECTIVE:  SUBJECTIVE STATEMENT: Pt indicated a little pain upon arrival in lateral thigh.  Pt indicated overall still having symptoms in area.  Pt reported constant symptoms in area.   Pt indicated symptoms go all the way to foot as well.   Communication with video interpreter.    PERTINENT HISTORY:  anxiety/depression, headaches  PAIN:  Number: 6/10 Location/description: Lt hip/lateral thigh into lateral lower leg/foot.  - aggravating factors: constant symptoms, didn't report specific activity.   - Easing factors: nothing specific   PRECAUTIONS: None  WEIGHT BEARING RESTRICTIONS: No  FALLS:  Has patient fallen in last 6 months? No  LIVING ENVIRONMENT: Mobile home, 4STE w/ BIL rails Lives w/ spouse and son  OCCUPATION: homemaker/caregiver  PLOF: Independent  PATIENT GOALS: eliminate pain - pt states she is concerned with chronicity, and wants to avoid symptoms getting worse  NEXT MD VISIT: 06/13/23 Dr. Shon Baton  OBJECTIVE:  Note: Objective measures were completed at Evaluation unless otherwise noted.  DIAGNOSTIC FINDINGS:  12/11/22 L hip XR (refer to EPIC for details) Per results narrative: "2 views of the left hip including AP and lateral film were ordered and  reviewed by myself.  X-ray shows a femoral head well-seated within the  acetabulum.  There is a well-preserved joint space with only minimal OA.   There is a small spur off the inferior aspect of the acetabulum. "  12/11/22 Lumbar XR (refer to EPIC for details) Per results narrative: "4 views of the lumbar spine including AP, lateral and flexion and  extension views were ordered and reviewed by myself.  X-rays demonstrate  no notable scoliosis.  There is mild intervertebral disc space narrowing  between L5-S1 with mild DDD.  There is some facet arthropathy at the L5  region, otherwise no acute fracture or other bony abnormality noted. "   PATIENT SURVEYS:   FOTO from 05/30/23:  49 current, 58 predicted  FOTO late check in - requested pt perform with front office after session. Follow up at next treatment if able   COGNITION: 05/30/2023 Overall cognitive status: Within functional limits for tasks assessed     SENSATION: 05/30/2023 Light touch intact BIL LE    PALPATION: 05/30/2023 Deferred given time constraints  Lumbar ROM 06/13/2023:   lumbar extension:  75% WFL without change in symptoms. Lumbar flexion:  to mid shin with no change in symptoms   LOWER EXTREMITY ROM:     Active   Right eval Left eval  Hip flexion    Hip extension    Hip internal rotation    Hip external rotation    Knee extension    Knee flexion    (Blank rows = not tested) (Key: WFL = within functional limits not formally assessed, * = concordant pain, s = stiffness/stretching sensation, NT = not tested)  Comments:    LOWER EXTREMITY MMT:    MMT Right 05/30/2023 Left 05/30/2023  Hip flexion 5 4- *  Hip abduction (modified sitting) 4 4  Hip internal rotation 5 5  Hip external rotation 5 4- *  Knee flexion 5 5  Knee extension 5 4+ *  Ankle dorsiflexion 5 5   (Blank rows = not tested) (Key: WFL = within functional limits not formally assessed, * = concordant pain, s = stiffness/stretching sensation, NT = not tested)  Comments:    LOWER EXTREMITY SPECIAL TESTS:  05/30/2023 Deferred given time constraints   FUNCTIONAL TESTS:  05/30/2023 30secSTS: 11 reps, fatigue, gentle UE support from thighs, in standard chair   GAIT: 05/30/2023 Distance walked: within clinic Assistive device utilized: None Level of assistance: Complete Independence Comments: grossly WNL                    TODAY'S TREATMENT:                                                     DATE: 06/13/2023 Therapeutic Exercise: Lumbar flexion standing ROM Lumbar extension AROM x 5 Sidelying clam shell Lt leg 2 x 10 Supine piriformis pull towards opposite shoulder 15 sec x 5 Supine bridge 2 x 10  Additional time in education of new HEP techniques and review.    Manual Percussive device to Lt glute med/min, lateral thigh (lateral quad, IT band) trigger points.     TODAY'S TREATMENT:                                                     DATE: 06/06/23 Therapeutic Exercise: Hooklying march 2x10 BIL alternating, cues for reduced velocity  Bridge 2x10  Seated green band ER w/ ball as fulcrum 2x15 cues for form and home setup Seated green band IR w/ ball as fulcrum 2x15 cues for form and home setup Seated triple  extension into fixed ball 2x8 cues for quad contraction and increased push STS w/ green band at knees 2x10, from slightly raised mat  Green band 45 deg kickbacks 2x10 BIL  Standing triple flex<>triple extension w/ counter support 2x8 BIL cues for reduced compensations, posture Kickstand paloff press green band 2x8, ER bias cues for  form, posture, and reduced compensations at knee  HEP update + education/handout    [facility] Adult PT Treatment:                                                DATE: 05/30/23 Therapeutic Exercise: Bridge x8 Hooklying march x8 HEP handout + education    PATIENT EDUCATION:  06/13/2023 Education details: HEP update Person educated: Patient with assistance of interpreter Education method: Explanation, Demonstration, Tactile cues, Verbal cues, and Handouts Education comprehension: verbalized understanding, returned demonstration, verbal cues required, tactile cues required, and needs further education    HOME EXERCISE PROGRAM: Access Code: [email or web address] URL: [email or web address] Date: 06/13/2023 Prepared by: Chyrel Masson  Program Notes - con rotacion internal, por favor cambia la banda y la pelota para trabajar la rotacion external tambien   Exercises - Supine Bridge  - 2-3 x daily - 7 x weekly - 1 sets - 8 reps - Supine March  - 2-3 x daily - 7 x weekly - 1 sets - 8 reps - Clamshell  - 1-2 x daily - 7 x weekly - 2-3 sets - 10 reps - Supine Piriformis Stretch with Foot on Ground (Mirrored)  - 2-3 x daily - 7 x weekly - 1 sets - 5 reps - 15 hold  ASSESSMENT:  CLINICAL IMPRESSION: Lumbar ROM did not impact Lt leg symptoms in clinic today.  Percussive device used today to address trigger points in lateral hip/thigh musculature.  Pt reported positive immediate results from use and benefit from use in future.  Continued skilled PT services at this time.   Decreased treatment time due to arrival time and waiting for interpreter (had to use video  one)  OBJECTIVE IMPAIRMENTS: decreased activity tolerance, difficulty walking, decreased strength, and pain.   ACTIVITY LIMITATIONS: sitting, standing, transfers, and locomotion level  PARTICIPATION LIMITATIONS: driving, community activity, and occupation  PERSONAL FACTORS: Time since onset of injury/illness/exacerbation are also affecting patient's functional outcome.   REHAB POTENTIAL: Fair given chronicity and prior bout of PT for same episode  CLINICAL DECISION MAKING: Stable/uncomplicated  EVALUATION COMPLEXITY: Low   GOALS: Goals reviewed with patient? Yes  SHORT TERM GOALS: Target date: 06/27/2023 Pt will demonstrate appropriate understanding and performance of initially prescribed HEP in order to facilitate improved independence with management of symptoms.  Baseline: HEP provided on eval Goal status: on going 06/13/2023   2. Pt will improve at least MCID on FOTO in order to demonstrate improved perception of function due to symptoms.  Baseline: FOTO TBD  Goal status:  on going 06/13/2023   LONG TERM GOALS: Target date: 07/25/2023 Pt will meet predicted score or greater on FOTO in order to demonstrate improved perception of function due to symptoms.  Baseline: FOTO TBD Goal status: INITIAL  2.  Pt will demonstrate at least 4+/5 throughout LLE hip/knee MMT for improved functional strength. Baseline: see MMT chart above Goal status: INITIAL  3. Pt will be able to perform at least 16 repetitions during 30 second sit to stand test in order to demonstrate improved exercise/activity tolerance (cutoff score for low exercise tolerance 18 repetitions in males and 16 repetitions in females per Georgina Snell al 2024)  Baseline: 11 repetitions, gentle UE support from thighs  Goal status: INITIAL    4. Pt will demonstrate appropriate performance of final prescribed HEP in order to facilitate improved self-management  of symptoms post-discharge.   Baseline: initial HEP  prescribed  Goal status: INITIAL    5. Pt will report at least 50% decrease in overall pain levels in past week in order to facilitate improved tolerance to basic ADLs/mobility.   Baseline: 2-7/10  Goal status: INITIAL     PLAN:  PT FREQUENCY: 1x/week  PT DURATION: 8 weeks  PLANNED INTERVENTIONS: 97146- PT Re-evaluation, 97110-Therapeutic exercises, 97530- Therapeutic activity, [id number]- Neuromuscular re-education, 97535- Self Care, 16109- Manual therapy, [id number]- Gait training, and [phone]- Aquatic Therapy  PLAN FOR NEXT SESSION: Percussive device per Pt desire.    Chyrel Masson, PT, DPT, OCS, ATC 06/13/23  10:58 AM

## 2023-06-20 ENCOUNTER — Ambulatory Visit (INDEPENDENT_AMBULATORY_CARE_PROVIDER_SITE_OTHER): Payer: Self-pay | Admitting: Sports Medicine

## 2023-06-20 ENCOUNTER — Encounter: Payer: Self-pay | Admitting: Rehabilitative and Restorative Service Providers"

## 2023-06-20 DIAGNOSIS — I8393 Asymptomatic varicose veins of bilateral lower extremities: Secondary | ICD-10-CM

## 2023-06-20 DIAGNOSIS — G8929 Other chronic pain: Secondary | ICD-10-CM

## 2023-06-20 DIAGNOSIS — M25552 Pain in left hip: Secondary | ICD-10-CM

## 2023-06-20 DIAGNOSIS — M51362 Other intervertebral disc degeneration, lumbar region with discogenic back pain and lower extremity pain: Secondary | ICD-10-CM

## 2023-06-20 DIAGNOSIS — M5442 Lumbago with sciatica, left side: Secondary | ICD-10-CM

## 2023-06-20 DIAGNOSIS — R29898 Other symptoms and signs involving the musculoskeletal system: Secondary | ICD-10-CM

## 2023-06-20 MED ORDER — GABAPENTIN 300 MG PO CAPS: 300.0000 mg | ORAL_CAPSULE | Freq: Two times a day (BID) | ORAL | 1 refills | Status: DC

## 2023-06-20 NOTE — Progress Notes (Signed)
Zanya Bindi Klomp - 47 y.o. female MRN [id number]  Date of birth: 1975-09-23  Office Visit Note: Visit Date: 06/20/2023 PCP: Claiborne Rigg, NP Referred by: Claiborne Rigg, NP  Subjective: Chief Complaint  Patient presents with   Right Hip - Pain, Follow-up   HPI: Pattricia Melina Schools [name] is a pleasant 47 y.o. female who presents today for follow-up of left > right lateral hip pain, also with radicular symptoms going down the left leg.  The use of an in person Spanish interpreter is used throughout the entirety of the visit today.  She continues with left lateral hip pain, her right lateral hip is starting to bother her as well.  She has been undergoing formalized physical therapy but feels this is actually worsening her symptoms.  In the past a left greater trochanteric injection gave her good relief but the most recent 1 was not as effective.  She also has been having back pain with radiation down the lateral thigh and lateral leg to the level of the ankle.  She does report that at times the leg feels "tired" or weak and admits to have numbness and tingling of the lateral leg.  Pertinent ROS were reviewed with the patient and found to be negative unless otherwise specified above in HPI.   Assessment & Plan: Visit Diagnoses:  1. Weakness of left hip   2. Greater trochanteric pain syndrome of left lower extremity   3. Chronic midline low back pain with left-sided sciatica   4. Degeneration of intervertebral disc of lumbar region with discogenic back pain and lower extremity pain    Plan: Christe is dealing with recurrent left greater than right lateral hip pain.  She has gotten relief from previous greater trochanteric injections although her pain has been exacerbated and the most recent 1 was not as helpful.  She continues with weakness despite physical therapy.  At this point I think we need to obtain an MRI of the hip to evaluate her gluteal tendons and the lateral hip  musculature to evaluate for possible tear.  She also has been having left lower extremity radicular symptoms with left leg heaviness/tiredness that does seem to be emanating from the low back. For her symptoms, she will begin gabapentin 300 mg nightly for the first 7 to 10 days and then may transition to twice daily dosing if tolerated.  This has been chronic and her radicular symptoms are now worsening, at the same time I would like to order an MRI of the lumbar spine to evaluate for neural impingement and intra-articular disc pathology.  She may continue therapy as her pain allows but if she is having pain with certain provocative motions, she will modify this.  She will follow-up with me 1 week after MRI scans to review next steps.  Follow-up: Return for f/u 1-week after MRI scans performed to review.   Meds & Orders:  Meds ordered this encounter  Medications   gabapentin (NEURONTIN) 300 MG capsule    Sig: Take 1 capsule (300 mg total) by mouth 2 (two) times daily.    Dispense:  60 capsule    Refill:  1    Orders Placed This Encounter  Procedures   MR Hip Left w/o contrast   MR Lumbar Spine w/o contrast     Procedures: No procedures performed      Clinical History: No specialty comments available.  She reports that she has never smoked. She has never used smokeless tobacco.  Recent Labs  07/19/22 1109 12/05/22 1541 03/28/23 1141  HGBA1C 6.7* 6.0* 6.1*    Objective:   Vital Signs: LMP 02/27/2021 (Approximate)   Physical Exam  Gen: Well-appearing, in no acute distress; non-toxic CV: Well-perfused. Warm.  Resp: Breathing unlabored on room air; no wheezing. Psych: Fluid speech in conversation; appropriate affect; normal thought process Neuro: Sensation intact throughout. No gross coordination deficits.   Ortho Exam - Bilateral hips: There is TTP over bilateral greater trochanters.  There is weakness with hip abduction on the left side secondary to pain.  Negative logroll  and no bony restrictions.  - Lumbar: No midline spinous process TTP.  Full range of motion with flexion and extension although pain at endrange.  Positive straight leg raise on the left.  Positive FABER testing.  Imaging:  2 views of the left hip including AP and lateral film were ordered and  reviewed by myself.  X-ray shows a femoral head well-seated within the  acetabulum.  There is a well-preserved joint space with only minimal OA.   There is a small spur off the inferior aspect of the acetabulum.   4 views of the lumbar spine including AP, lateral and flexion and  extension views were ordered and reviewed by myself.  X-rays demonstrate  no notable scoliosis.  There is mild intervertebral disc space narrowing  between L5-S1 with mild DDD.  There is some facet arthropathy at the L5  region, otherwise no acute fracture or other bony abnormality noted.   Past Medical/Family/Surgical/Social History: Medications & Allergies reviewed per EMR, new medications updated. Patient Active Problem List   Diagnosis Date Noted   Vasomotor symptoms due to menopause 12/03/2022   Headache 05/07/2016   Depression 08/17/2015   Insomnia 08/17/2015   Elevated lipids 04/08/2013   Family history of early CAD 04/08/2013   Hemorrhoid    Dysuria    Past Medical History:  Diagnosis Date   Anxiety    Depression    Dysuria    Hemorrhoid    High cholesterol    Family History  Problem Relation Age of Onset   Diabetes Mother    Diabetes Sister    Diabetes Brother    Heart disease Other    Breast cancer Neg Hx    Past Surgical History:  Procedure Laterality Date   HEMORRHOID SURGERY     Social History   Occupational History   Not on file  Tobacco Use   Smoking status: Never   Smokeless tobacco: Never  Vaping Use   Vaping status: Never Used  Substance and Sexual Activity   Alcohol use: No   Drug use: No   Sexual activity: Yes    Birth control/protection: Post-menopausal

## 2023-06-20 NOTE — Progress Notes (Signed)
Patient says that she is feeling worse. She says that she thinks the physical therapy is adding to her pain. She says that she is still having pain in the side of the hip, but is now also having pain in the back of the hip and now on the left side as well. She says that her legs "feel tired" and she will sometimes have tingling in the thigh; this tingling stops at the knee but she does say that sometimes at night she will feel it in the feet. Patient also describes feeling stiff.

## 2023-06-27 ENCOUNTER — Encounter: Payer: Self-pay | Admitting: Rehabilitative and Restorative Service Providers"

## 2023-08-02 ENCOUNTER — Ambulatory Visit
Admission: RE | Admit: 2023-08-02 | Discharge: 2023-08-02 | Disposition: A | Discharge status: Home / Self Care | Payer: Self-pay | Source: Ambulatory Visit | Attending: Sports Medicine | Admitting: Sports Medicine

## 2023-08-02 DIAGNOSIS — G8929 Other chronic pain: Secondary | ICD-10-CM

## 2023-08-02 DIAGNOSIS — M25552 Pain in left hip: Secondary | ICD-10-CM

## 2023-08-02 DIAGNOSIS — M51362 Other intervertebral disc degeneration, lumbar region with discogenic back pain and lower extremity pain: Secondary | ICD-10-CM

## 2023-08-02 DIAGNOSIS — R29898 Other symptoms and signs involving the musculoskeletal system: Secondary | ICD-10-CM

## 2023-08-08 ENCOUNTER — Telehealth: Payer: Self-pay

## 2023-08-08 NOTE — Telephone Encounter (Signed)
Patient came in requesting refills on Citalopram Hydrobromide and for them to be sent to [facility] pharmacy on [address]

## 2023-08-12 NOTE — Telephone Encounter (Signed)
Pt is returning a call from the office. Pt states she was told in order to continue taking the medication she needed a PCP in order to continue the medication. Pt says she hasn't been to the OBGYN in years and says they won't refill the medication for her. Please advise.

## 2023-08-15 ENCOUNTER — Other Ambulatory Visit: Payer: Self-pay

## 2023-08-15 NOTE — Telephone Encounter (Signed)
[facility]. This was verified by the pharmacy. She does not have refills at [facility].

## 2023-08-15 NOTE — Telephone Encounter (Signed)
Interpreter Leretha Pol ID# [id number] Patient identified by name and date of birth.  Patient aware of response and voiced understanding.  Pharmacy phone number given to patient.

## 2023-08-19 ENCOUNTER — Encounter: Payer: Self-pay | Admitting: Physical Therapy

## 2023-08-19 NOTE — Therapy (Signed)
 PHYSICAL THERAPY DISCHARGE SUMMARY  Visits from Start of Care: 3  Current functional level related to goals / functional outcomes: Has not returned in greater than 30 days, DC per [facility] policy    Remaining deficits: N/A    Education / Equipment: N/A    Patient agrees to discharge. Patient goals were met. Patient is being discharged due to not returning since the last visit.  Josette Rough, PT, DPT 08/19/23 12:10 PM

## 2023-09-05 ENCOUNTER — Other Ambulatory Visit: Payer: Self-pay

## 2023-09-05 ENCOUNTER — Encounter: Payer: Self-pay | Admitting: Sports Medicine

## 2023-09-05 ENCOUNTER — Ambulatory Visit (INDEPENDENT_AMBULATORY_CARE_PROVIDER_SITE_OTHER): Payer: Self-pay | Admitting: Sports Medicine

## 2023-09-05 DIAGNOSIS — M5442 Lumbago with sciatica, left side: Secondary | ICD-10-CM

## 2023-09-05 DIAGNOSIS — M67952 Unspecified disorder of synovium and tendon, left thigh: Secondary | ICD-10-CM

## 2023-09-05 DIAGNOSIS — R29898 Other symptoms and signs involving the musculoskeletal system: Secondary | ICD-10-CM

## 2023-09-05 DIAGNOSIS — M51362 Other intervertebral disc degeneration, lumbar region with discogenic back pain and lower extremity pain: Secondary | ICD-10-CM

## 2023-09-05 DIAGNOSIS — G8929 Other chronic pain: Secondary | ICD-10-CM

## 2023-09-05 MED ORDER — NITROGLYCERIN 0.2 MG/HR TD PT24
MEDICATED_PATCH | TRANSDERMAL | 0 refills | Status: DC
  Filled 2023-09-05: qty 30, 30d supply, fill #0

## 2023-09-05 MED ORDER — GABAPENTIN 300 MG PO CAPS
300.0000 mg | ORAL_CAPSULE | Freq: Two times a day (BID) | ORAL | 1 refills | Status: DC
  Filled 2023-09-05: qty 60, 30d supply, fill #0

## 2023-09-05 NOTE — Progress Notes (Signed)
Jennifer Noble - 48 y.o. female MRN [id number]  Date of birth: 1976/01/20  Office Visit Note: Visit Date: 09/05/2023 PCP: Claiborne Rigg, NP Referred by: Claiborne Rigg, NP  Subjective: Chief Complaint  Patient presents with   Right Hip - Follow-up   Lower Back - Follow-up   HPI: Jennifer Noble is a pleasant 48 y.o. female who presents today for follow-up of chronic right lateral hip pain as well as low back pain with intermittent radiculopathy.  Here for MRI review of both.  The use of an in person Spanish interpreter is used throughout the entirety of the visit today.  Right hip - she still has some pain and does note some weakness but the gabapentin medicine is helping.  She did perform multiple sessions of physical therapy but did not feel like this was helpful.  She is now able to sleep on that hip which she has not in the past.  Low back -still has some low back pain but she has had resolution of her radicular symptoms down the legs.  She did progress to gabapentin 300 mg twice daily which she is taking and helpful.  Did have MRIs of the spine which we reviewed.  Pertinent ROS were reviewed with the patient and found to be negative unless otherwise specified above in HPI.   Assessment & Plan: Visit Diagnoses:  1. Tendinopathy of left gluteus medius   2. Weakness of left hip   3. Chronic midline low back pain with left-sided sciatica   4. Degeneration of intervertebral disc of lumbar region with discogenic back pain and lower extremity pain    Plan: Impression is chronic left lateral hip pain with severe tendinopathy and some undersurface tearing of the gluteus medius which is causing both pain as well as some weakness.  Her pain overall is better from the start.  We discussed doing some HEP and proceeding with nitroglycerin patch protocol 0.2 mg/h for blood flow recruitment and to augment tendon healing.  I would like to see how her pain and strength  improves with this over the next 2 months.  If with the nitroglycerin patch and her HEP she is not able to improve her pain and weakness, next step would be sending her to Dr. Steward Drone for evaluation of surgical treatment.  In terms of her low back, her MRI does have a mild disc bulge both at the L4-L5 and L5-S1 level although no neural impingement.  Her radicular symptoms have resolved, she will continue her gabapentin 300 mg twice daily until her follow-up in 2 months.  We will reevaluate both of these at that time.  Follow-up: Return in about 2 months (around 11/03/2023) for for lat-hip and back (30-min f/u).   Meds & Orders:  Meds ordered this encounter  Medications   gabapentin (NEURONTIN) 300 MG capsule    Sig: Take 1 capsule (300 mg total) by mouth 2 (two) times daily.    Dispense:  60 capsule    Refill:  1   nitroGLYCERIN (NITRODUR - DOSED IN MG/24 HR) 0.2 mg/hr patch    Sig: Cut patch into fourths. Place 1/4 patch over affected area on hip. Change every 24 hours.    Dispense:  30 patch    Refill:  0   No orders of the defined types were placed in this encounter.    Procedures: No procedures performed      Clinical History: No specialty comments available.  She reports that she has  never smoked. She has never used smokeless tobacco.  Recent Labs    12/05/22 1541 03/28/23 1141  HGBA1C 6.0* 6.1*    Objective:   Vital Signs: LMP 02/27/2021 (Approximate)   Physical Exam  Gen: Well-appearing, in no acute distress; non-toxic CV: Well-perfused. Warm.  Resp: Breathing unlabored on room air; no wheezing. Psych: Fluid speech in conversation; appropriate affect; normal thought process  Ortho Exam - Lumbar: Negative SLR  - Right hip: Positive TTP over the greater trochanteric region on the right, negative on the left.  There is 3.5/5 strength with resisted hip abduction on the right with both weakness and reproduction of pain.  Otherwise close hip moves  fluidly.  Imaging:  MR Lumbar Spine w/o contrast CLINICAL DATA:  Chronic left hip pain, chronic low back pain for 3 years  EXAM: MRI LUMBAR SPINE WITHOUT CONTRAST  TECHNIQUE: Multiplanar, multisequence MR imaging of the lumbar spine was performed. No intravenous contrast was administered.  COMPARISON:  None Available.  FINDINGS: Segmentation:  Standard.  Alignment:  Physiologic.  Vertebrae: No acute fracture, evidence of discitis, or aggressive bone lesion.  Conus medullaris and cauda equina: Conus extends to the L1 level. Conus and cauda equina appear normal.  Paraspinal and other soft tissues: No acute paraspinal abnormality.  Disc levels:  Disc spaces: Mild disc height loss at L4-5 and L5-S1.  T12-L1: Small shallow left paracentral disc protrusion. No foraminal or central canal stenosis.  L1-L2: No disc protrusion, foraminal stenosis or central canal stenosis.  L2-L3: No disc protrusion, foraminal stenosis or central canal stenosis.  L3-L4: No disc protrusion, foraminal stenosis or central canal stenosis.  L4-L5: Mild disc bulge. Mild bilateral facet arthropathy. No foraminal or central canal stenosis. Mild bilateral lateral recess narrowing.  L5-S1: Mild disc bulge with a small broad shallow central disc protrusion. No foraminal or central canal stenosis.  IMPRESSION: 1. Mild lower lumbar spine spondylosis as described above. 2. No acute osseous injury of the lumbar spine.  Electronically Signed   By: Elige Ko M.D.   On: 08/12/2023 06:20 MR Hip Left w/o contrast CLINICAL DATA:  Chronic low back pain and left hip pain for 3 years.  EXAM: MR OF THE LEFT HIP WITHOUT CONTRAST  TECHNIQUE: Multiplanar, multisequence MR imaging was performed. No intravenous contrast was administered.  COMPARISON:  None Available.  FINDINGS: Bones:  No hip fracture, dislocation or avascular necrosis.  No periosteal reaction or bone destruction. No  aggressive osseous lesion.  Normal sacrum and sacroiliac joints. No SI joint widening or erosive changes.  Mild scratch them moderate degenerative changes of the pubic symphysis with mild subchondral marrow edema.  Articular cartilage and labrum  Articular cartilage:  No chondral defect.  Labrum: Grossly intact, but evaluation is limited by lack of intraarticular fluid.  Joint or bursal effusion  Joint effusion:  No hip joint effusion.  No SI joint effusion.  Bursae:  No bursal fluid.  Muscles and tendons  Flexors: Normal.  Extensors: Normal.  Abductors: Normal.  Adductors: Normal.  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  Hamstrings: Normal.  Other findings  No pelvic free fluid. No fluid collection or hematoma. No inguinal lymphadenopathy. No inguinal hernia.  IMPRESSION: 1. No hip fracture, dislocation or avascular necrosis. 2. 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  Electronically Signed   By: Elige Ko M.D.   On: 08/12/2023 06:17  Past Medical/Family/Surgical/Social History: Medications & Allergies reviewed per EMR, new medications updated. Patient Active Problem  List   Diagnosis Date Noted   Vasomotor symptoms due to menopause 12/03/2022   Headache 05/07/2016   Depression 08/17/2015   Insomnia 08/17/2015   Elevated lipids 04/08/2013   Family history of early CAD 04/08/2013   Hemorrhoid    Dysuria    Past Medical History:  Diagnosis Date   Anxiety    Depression    Dysuria    Hemorrhoid    High cholesterol    Family History  Problem Relation Age of Onset   Diabetes Mother    Diabetes Sister    Diabetes Brother    Heart disease Other    Breast cancer Neg Hx    Past Surgical History:  Procedure Laterality Date   HEMORRHOID SURGERY     Social History   Occupational History   Not on file  Tobacco Use   Smoking status: Never   Smokeless tobacco: Never  Vaping Use    Vaping status: Never Used  Substance and Sexual Activity   Alcohol use: No   Drug use: No   Sexual activity: Yes    Birth control/protection: Post-menopausal

## 2023-09-05 NOTE — Progress Notes (Signed)
Patient says that she still has pain but the medication has helped a bit.

## 2023-09-12 ENCOUNTER — Encounter: Payer: Self-pay | Admitting: Sports Medicine

## 2023-09-12 ENCOUNTER — Ambulatory Visit (INDEPENDENT_AMBULATORY_CARE_PROVIDER_SITE_OTHER): Payer: Self-pay | Admitting: Sports Medicine

## 2023-09-12 DIAGNOSIS — M25552 Pain in left hip: Secondary | ICD-10-CM

## 2023-09-12 DIAGNOSIS — M67952 Unspecified disorder of synovium and tendon, left thigh: Secondary | ICD-10-CM

## 2023-09-12 DIAGNOSIS — T887XXA Unspecified adverse effect of drug or medicament, initial encounter: Secondary | ICD-10-CM

## 2023-09-12 NOTE — Progress Notes (Signed)
Jennifer Noble - 48 y.o. female MRN [id number]  Date of birth: [birth year]  Office Visit Note: Visit Date: 09/12/2023 PCP: Claiborne Rigg, NP Referred by: Claiborne Rigg, NP  Subjective: Chief Complaint  Patient presents with   Right Hip - Follow-up   Lower Back - Follow-up   HPI: Jennifer Noble is a pleasant 48 y.o. female who presents today for follow-up of lateral hip pain - having headaches from medication.  The use of an in-person Spanish interpreter is used at the entirety of the visit today.  She has started placing a nitroglycerin patch on the lateral hip, since then she has been having rather significant headache as well as occasional dizziness when using the patch.  She was aware that I told her this could happen, but she is asking what to do next.  She did take the patch off over the last 2 days and her headache and dizziness has resolved.  Pertinent ROS were reviewed with the patient and found to be negative unless otherwise specified above in HPI.   Assessment & Plan: Visit Diagnoses:  1. Tendinopathy of left gluteus medius   2. Greater trochanteric pain syndrome of left lower extremity   3. Medication side effect    Plan: Discussed with Jennifer Noble that this is a common side effect we see with nitroglycerin patch, however most of the time this will go away up to 1-2 weeks from initiation of treatment.  At this point, I would like her to cut her patch in half so it would be 1/8 patch which she will apply.  She will do this for the next 1 week as well as increase fluid intake and may use ibuprofen/Motrin to help with the side effects.  If this is better tolerated, she will continue this dose and can continue increasing to one fourth patch again depending on her side effect profile.  If after the next 1-2 weeks she is still having headaches and other side effects, we will discontinue this treatment and consider additional alternatives.  Follow-up:  Return if symptoms worsen or fail to improve.   Meds & Orders: No orders of the defined types were placed in this encounter.  No orders of the defined types were placed in this encounter.    Procedures: No procedures performed      Clinical History: No specialty comments available.  She reports that she has never smoked. She has never used smokeless tobacco.  Recent Labs    12/05/22 1541 03/28/23 1141  HGBA1C 6.0* 6.1*    Objective:   Vital Signs: LMP 02/27/2021 (Approximate)   Physical Exam  Gen: Well-appearing, in no acute distress; non-toxic CV: Well-perfused. Warm.  Resp: Breathing unlabored on room air; no wheezing. Psych: Fluid speech in conversation; appropriate affect; normal thought process  Ortho Exam - Left hip: unchanged from last visit  Imaging: No results found.  Past Medical/Family/Surgical/Social History: Medications & Allergies reviewed per EMR, new medications updated. Patient Active Problem List   Diagnosis Date Noted   Vasomotor symptoms due to menopause 12/03/2022   Headache 05/07/2016   Depression 08/17/2015   Insomnia 08/17/2015   Elevated lipids 04/08/2013   Family history of early CAD 04/08/2013   Hemorrhoid    Dysuria    Past Medical History:  Diagnosis Date   Anxiety    Depression    Dysuria    Hemorrhoid    High cholesterol    Family History  Problem Relation Age of Onset  Diabetes Mother    Diabetes Sister    Diabetes Brother    Heart disease Other    Breast cancer Neg Hx    Past Surgical History:  Procedure Laterality Date   HEMORRHOID SURGERY     Social History   Occupational History   Not on file  Tobacco Use   Smoking status: Never   Smokeless tobacco: Never  Vaping Use   Vaping status: Never Used  Substance and Sexual Activity   Alcohol use: No   Drug use: No   Sexual activity: Yes    Birth control/protection: Post-menopausal

## 2023-09-12 NOTE — Progress Notes (Signed)
Patient says that she began using the patches the day of her last appointment and had a prickly feeling in her forehead. She says that she had 5 days of an intense headache and dizziness. She says that over the last two days she has not had the headache, but was concerned as the headache was so intense and lasted so long. She did take Tylenol during that time but did not get relief from that.

## 2023-10-04 ENCOUNTER — Emergency Department (HOSPITAL_COMMUNITY): Payer: Self-pay

## 2023-10-04 ENCOUNTER — Encounter (HOSPITAL_COMMUNITY): Payer: Self-pay

## 2023-10-04 ENCOUNTER — Emergency Department (HOSPITAL_COMMUNITY)
Admission: EM | Admit: 2023-10-04 | Discharge: 2023-10-04 | Disposition: A | Discharge status: Home / Self Care | Payer: Self-pay | Attending: Emergency Medicine | Admitting: Emergency Medicine

## 2023-10-04 ENCOUNTER — Other Ambulatory Visit: Payer: Self-pay

## 2023-10-04 DIAGNOSIS — J101 Influenza due to other identified influenza virus with other respiratory manifestations: Secondary | ICD-10-CM | POA: Insufficient documentation

## 2023-10-04 LAB — RESP PANEL BY RT-PCR (RSV, FLU A&B, COVID)  RVPGX2
Influenza A by PCR: POSITIVE — AB
Influenza B by PCR: NEGATIVE
Resp Syncytial Virus by PCR: NEGATIVE
SARS Coronavirus 2 by RT PCR: NEGATIVE

## 2023-10-04 MED ORDER — ACETAMINOPHEN 500 MG PO TABS
1000.0000 mg | ORAL_TABLET | Freq: Once | ORAL | Status: AC
  Administered 2023-10-04: 1000 mg via ORAL
  Filled 2023-10-04: qty 2

## 2023-10-04 MED ORDER — BENZONATATE 100 MG PO CAPS: 100.0000 mg | ORAL_CAPSULE | Freq: Three times a day (TID) | ORAL | 0 refills | Status: DC

## 2023-10-04 NOTE — Discharge Instructions (Addendum)
 You were seen in the emergency room today and found to have influenza.  Your chest x-ray is normal without any evidence of pneumonia.  I recommend taking Tylenol 4 times daily as well as ibuprofen 3 times daily.  Make sure staying well-hydrated you can supplement with Gatorade and Pedialyte. I have sent Tessalon to your pharmacy for cough. Return to emergency room with any new or worsening symptoms

## 2023-10-04 NOTE — ED Notes (Addendum)
 Pt resting in bed. No needs at this time. Call light in reach

## 2023-10-04 NOTE — ED Provider Triage Note (Signed)
 Emergency Medicine Provider Triage Evaluation Note  Ebone Sharanya Templin , a 48 y.o. female  was evaluated in triage.  Pt complains of cough and congestion  Review of Systems  Positive: cough Negative: Shortness of breath  Physical Exam  BP (!) 146/92 (BP Location: Right Arm)   Pulse (!) 113   Temp 99.3 F (37.4 C)   Resp 16   LMP 02/27/2021 (Approximate)   SpO2 98%  Gen:   Awake, no distress   Resp:  Normal effort  MSK:   Moves extremities without difficulty  Other:    Medical Decision Making  Medically screening exam initiated at 11:29 AM.  Appropriate orders placed.  Ashleynicole Aburto [name] was informed that the remainder of the evaluation will be completed by another provider, this initial triage assessment does not replace that evaluation, and the importance of remaining in the ED until their evaluation is complete.     Elson Areas, [place] 10/04/23 1129

## 2023-10-04 NOTE — ED Provider Notes (Signed)
 [place] EMERGENCY DEPARTMENT AT [facility] Provider Note   CSN: [id number] Arrival date & time: 10/04/23  1114     History  Chief Complaint  Patient presents with   Cough   Headache    Jennifer [employer] [name] is a 48 y.o. female patient with past medical history of hyperlipidemia and headaches is presenting to emergency room with headache cough congestion and bodyaches.  Patient reports this been ongoing for approximately 1 week.  Patient reports she has had chills last 2 days and feels that her cough is starting to become productive.  She denies any chest pain or shortness of breath.  She has been tolerating oral intake at home denies any associated nausea vomiting diarrhea.  Spanish interpreter used for HPI   Cough Associated symptoms: headaches   Headache Associated symptoms: cough        Home Medications Prior to Admission medications   Medication Sig Start Date End Date Taking? Authorizing Provider  citalopram (CELEXA) 20 MG tablet Take 1 tablet (20 mg total) by mouth daily. 12/03/22   Venora Maples, MD  fluticasone (FLONASE) 50 MCG/ACT nasal spray Place 2 sprays into both nostrils daily. 03/28/23   Claiborne Rigg, NP  gabapentin (NEURONTIN) 300 MG capsule Take 1 capsule (300 mg total) by mouth 2 (two) times daily. 09/05/23   Madelyn Brunner, DO  nitroGLYCERIN (NITRODUR - DOSED IN MG/24 HR) 0.2 mg/hr patch Cut patch into fourths. Place 1/4 patch over affected area on hip. Change every 24 hours. 09/05/23   Madelyn Brunner, DO      Allergies    Naproxen    Review of Systems   Review of Systems  Respiratory:  Positive for cough.   Neurological:  Positive for headaches.    Physical Exam Updated Vital Signs BP (!) 137/94   Pulse 92   Temp 99.3 F (37.4 C)   Resp 18   Ht 5' 2.5" (1.588 m)   Wt 74.8 kg   LMP 02/27/2021 (Approximate)   SpO2 98%   BMI 29.68 kg/m  Physical Exam Vitals and nursing note reviewed.  Constitutional:      General: She  is not in acute distress.    Appearance: She is not toxic-appearing.  HENT:     Head: Normocephalic and atraumatic.  Eyes:     General: No scleral icterus.    Conjunctiva/sclera: Conjunctivae normal.  Cardiovascular:     Rate and Rhythm: Normal rate and regular rhythm.     Pulses: Normal pulses.     Heart sounds: Normal heart sounds.  Pulmonary:     Effort: Pulmonary effort is normal. No respiratory distress.     Breath sounds: Normal breath sounds.  Abdominal:     General: Abdomen is flat. Bowel sounds are normal.     Palpations: Abdomen is soft.     Tenderness: There is no abdominal tenderness.  Musculoskeletal:     Right lower leg: No edema.     Left lower leg: No edema.  Skin:    General: Skin is warm and dry.     Findings: No lesion.  Neurological:     General: No focal deficit present.     Mental Status: She is alert and oriented to person, place, and time. Mental status is at baseline.     ED Results / Procedures / Treatments   Labs (all labs ordered are listed, but only abnormal results are displayed) Labs Reviewed  RESP PANEL BY RT-PCR (RSV, FLU A&B, COVID)  RVPGX2 - Abnormal; Notable for the following components:      Result Value   Influenza A by PCR POSITIVE (*)    All other components within normal limits    EKG None  Radiology DG Chest 2 View Result Date: 10/04/2023 CLINICAL DATA:  Cough.  Headache. EXAM: CHEST - 2 VIEW COMPARISON:  None Available. FINDINGS: There are probable atelectatic changes/scarring in the left retrocardiac region. Bilateral lung fields are otherwise clear. No dense consolidation or lung collapse. Bilateral costophrenic angles are clear. Normal cardio-mediastinal silhouette. No acute osseous abnormalities. The soft tissues are within normal limits. IMPRESSION: No active cardiopulmonary disease. Electronically Signed   By: Jules Schick M.D.   On: 10/04/2023 13:44    Procedures Procedures    Medications Ordered in  ED Medications  acetaminophen (TYLENOL) tablet 1,000 mg (1,000 mg Oral Given 10/04/23 1228)    ED Course/ Medical Decision Making/ A&P                                 Medical Decision Making Amount and/or Complexity of Data Reviewed Radiology: ordered.  Risk OTC drugs. Prescription drug management.   Taegen Aburto [name] 48 y.o. presented today for URI like symptoms. Working DDx that I considered at this time includes, but not limited to, viral illness, pharyngitis, mono, sinusitis, electrolyte abnormality, AOM.  R/o DDx: these additional diagnoses are not consistent with patient's history, presentation, physical exam, labs/imaging findings.  Review of prior external notes: none  Labs:  Respiratory Panel: Positive for influenza A   Imaging:  Chest x-ray negative   Problem List / ED Course / Critical interventions / Medication management  Patient reported to emergency room with complaint of muscle aches, congestion, cough.  Patient was dealing with symptoms about a week.  Has not had temperature for the past 2 3 days however does have subjective chills.  Patient has not been taking anything for symptoms.  She notes sick contact.  Symptoms consistent with flu.  Patient test positive for the flu.  Given duration of symptoms will rule out pneumonia with chest x-ray considering she continues to have a cough.  Patient is hemodynamically stable well-appearing and not hypoxic.  I anticipate the patient will be stable for symptomatic management including Tessalon Perles and follow-up outpatient for symptoms. I ordered medication including tylenol  Reevaluation of the patient after these medicines showed that the patient improved Patients vitals assessed. Upon arrival patient is hemodynamically stable.  I have reviewed the patients home medicines and have made adjustments as needed     Plan:  F/u w/ PCP in 2-3d to ensure resolution of sx.  Patient was given return precautions.  Patient stable for discharge at this time.  Patient educated on sx and dx and verbalized understanding of plan. Return to ER if new or worsening sx.          Final Clinical Impression(s) / ED Diagnoses Final diagnoses:  None    Rx / DC Orders ED Discharge Orders     None         Smitty Knudsen, PA-C 10/04/23 1351    Wynetta Fines, MD 10/04/23 1557

## 2023-10-04 NOTE — ED Triage Notes (Signed)
 Spanish interpreter used for triage: Pt c.o 1 week of headache, congestion, cough and body aches.

## 2023-10-27 ENCOUNTER — Ambulatory Visit: Payer: Self-pay | Attending: Family Medicine

## 2023-10-27 ENCOUNTER — Other Ambulatory Visit: Payer: Self-pay

## 2023-10-27 DIAGNOSIS — Z23 Encounter for immunization: Secondary | ICD-10-CM

## 2023-10-27 NOTE — Progress Notes (Signed)
 Flu vaccine administered in right deltoid per protocols.  Information sheet given. Patient denies and pain or discomfort at injection site. Tolerated injection well no reaction.   Hep B vaccine administered in left deltoid per protocols.  Information sheet given. Patient denies and pain or discomfort at injection site. Tolerated injection well no reaction.    TDAP vaccine administered in left deltoid per protocols.  Information sheet given. Patient denies and pain or discomfort at injection site. Tolerated injection well no reaction.

## 2023-10-31 ENCOUNTER — Ambulatory Visit: Payer: Self-pay | Admitting: Sports Medicine

## 2023-10-31 DIAGNOSIS — M25552 Pain in left hip: Secondary | ICD-10-CM

## 2023-10-31 DIAGNOSIS — S76012D Strain of muscle, fascia and tendon of left hip, subsequent encounter: Secondary | ICD-10-CM

## 2023-10-31 DIAGNOSIS — M5136 Other intervertebral disc degeneration, lumbar region with discogenic back pain only: Secondary | ICD-10-CM

## 2023-10-31 NOTE — Progress Notes (Signed)
 Patient says that since reducing the dose of her patches her headaches have improved, although she does still have them. She says that she has some pinching pain in the low back and side of the hip, but overall does feel better. She says that she has not had any new symptoms since her last visit.

## 2023-10-31 NOTE — Progress Notes (Signed)
 Jennifer Noble - 48 y.o. female MRN [id number]  Date of birth: Jul 16, 1976  Office Visit Note: Visit Date: 10/31/2023 PCP: Claiborne Rigg, NP Referred by: Claiborne Rigg, NP  Subjective: Chief Complaint  Patient presents with   Lower Back - Follow-up   Left Hip - Follow-up   HPI: Jennifer Noble [employer] Jennifer Noble is a pleasant 48 y.o. female who presents today for follow-up of left lateral hip pain with known tearing of the gluteus minimus tendon, follow-up low back pain.  Left lateral hip -she has good and bad days but still having pain as well as weakness over the lateral hip.  This is localized.  To the lateral side.  She has been continuing the one half dose of the nitroglycerin patch but still having mild headaches in the morning.  Back -she has some tightness or pinching sensation in the low back but she is no longer having any radicular symptoms.  She has been on gabapentin with mixed relief.  Pertinent ROS were reviewed with the patient and found to be negative unless otherwise specified above in HPI.   Assessment & Plan: Visit Diagnoses:  1. Tear of left gluteus minimus tendon, subsequent encounter   2. Lateral pain of left hip   3. 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  She unfortunately has failed treatment with prior injection therapy as well as multiple months of both home therapy and formalized PT.  She is still having weakness with hip abduction, which is from her gluteus tearing.  At this point, we discussed seeking surgical evaluation with Dr. Steward Drone for possible gluteus maximus transfer.  In terms of her low back pain, she will continue her stretching, would benefit from possible chiropractic care.  She is no longer having any radicular pain for the last few months Xu she may discontinue her  gabapentin.  I also would like her to discontinue her nitroglycerin patch 0.2 mg/h given her continued side effects of headaches.  She will follow-up with Dr. Steward Drone for surgical management and I am happy to see her back as needed.  Follow-up: Return for make appt with DR. Bokshan to discuss R-gluteus tendon surgery .   Meds & Orders: No orders of the defined types were placed in this encounter.  No orders of the defined types were placed in this encounter.    Procedures: No procedures performed      Clinical History: No specialty comments available.  She reports that she has never smoked. She has never used smokeless tobacco.  Recent Labs    12/05/22 1541 03/28/23 1141  HGBA1C 6.0* 6.1*    Objective:   Vital Signs: LMP 02/27/2021 (Approximate)   Physical Exam  Gen: Well-appearing, in no acute distress; non-toxic CV: Well-perfused. Warm.  Resp: Breathing unlabored on room air; no wheezing. Psych: Fluid speech in conversation; appropriate affect; normal thought process  Ortho Exam - Left hip: + TTP over the greater trochanteric region of the left hip, no tenderness of the contralateral right hip.  There is 3-4/5 strength with resisted hip abduction compared to full strength on the contralateral hip.  The left hip moves fluidly with internal and external logroll.  - Lumbar: Negative SLR, intact sensation in the lower extremity dermatomes.  Imaging:  Narrative & Impression  CLINICAL DATA:  Chronic low back pain and left hip pain for 3  years.   EXAM: MR OF THE LEFT HIP WITHOUT CONTRAST   TECHNIQUE: Multiplanar, multisequence MR imaging was performed. No intravenous contrast was administered.   COMPARISON:  None Available.   FINDINGS: Bones:   No hip fracture, dislocation or avascular necrosis.   No periosteal reaction or bone destruction. No aggressive osseous lesion.   Normal sacrum and sacroiliac joints. No SI joint widening or erosive changes.   Mild  scratch them moderate degenerative changes of the pubic symphysis with mild subchondral marrow edema.   Articular cartilage and labrum   Articular cartilage:  No chondral defect.   Labrum: Grossly intact, but evaluation is limited by lack of intraarticular fluid.   Joint or bursal effusion   Joint effusion:  No hip joint effusion.  No SI joint effusion.   Bursae:  No bursal fluid.   Muscles and tendons   Flexors: Normal.   Extensors: Normal.   Abductors: Normal.   Adductors: Normal.   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   Hamstrings: Normal.   Other findings   No pelvic free fluid. No fluid collection or hematoma. No inguinal lymphadenopathy. No inguinal hernia.   IMPRESSION: 1. No hip fracture, dislocation or avascular necrosis. 2. 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     Electronically Signed   By: Elige Ko M.D.   On: 08/12/2023 06:17    Past Medical/Family/Surgical/Social History: Medications & Allergies reviewed per EMR, new medications updated. Patient Active Problem List   Diagnosis Date Noted   Vasomotor symptoms due to menopause 12/03/2022   Headache 05/07/2016   Depression 08/17/2015   Insomnia 08/17/2015   Elevated lipids 04/08/2013   Family history of early CAD 04/08/2013   Hemorrhoid    Dysuria    Past Medical History:  Diagnosis Date   Anxiety    Depression    Dysuria    Hemorrhoid    High cholesterol    Family History  Problem Relation Age of Onset   Diabetes Mother    Diabetes Sister    Diabetes Brother    Heart disease Other    Breast cancer Neg Hx    Past Surgical History:  Procedure Laterality Date   HEMORRHOID SURGERY     Social History   Occupational History   Not on file  Tobacco Use   Smoking status: Never   Smokeless tobacco: Never  Vaping Use   Vaping status: Never Used  Substance and Sexual Activity   Alcohol use: No   Drug use: No    Sexual activity: Yes    Birth control/protection: Post-menopausal

## 2023-11-21 ENCOUNTER — Other Ambulatory Visit (HOSPITAL_BASED_OUTPATIENT_CLINIC_OR_DEPARTMENT_OTHER): Payer: Self-pay

## 2023-11-21 ENCOUNTER — Ambulatory Visit (HOSPITAL_BASED_OUTPATIENT_CLINIC_OR_DEPARTMENT_OTHER): Payer: Self-pay | Admitting: Orthopaedic Surgery

## 2023-11-21 DIAGNOSIS — S76012D Strain of muscle, fascia and tendon of left hip, subsequent encounter: Secondary | ICD-10-CM

## 2023-11-21 MED ORDER — OXYCODONE HCL 5 MG PO TABS
5.0000 mg | ORAL_TABLET | ORAL | 0 refills | Status: DC | PRN
  Filled 2023-11-21: qty 15, 3d supply, fill #0

## 2023-11-21 MED ORDER — ACETAMINOPHEN 500 MG PO TABS
500.0000 mg | ORAL_TABLET | Freq: Three times a day (TID) | ORAL | 0 refills | Status: AC
  Filled 2023-11-21: qty 30, 10d supply, fill #0

## 2023-11-21 MED ORDER — ASPIRIN 325 MG PO TBEC
325.0000 mg | DELAYED_RELEASE_TABLET | Freq: Every day | ORAL | 0 refills | Status: DC
  Filled 2023-11-21: qty 14, 14d supply, fill #0

## 2023-11-21 MED ORDER — IBUPROFEN 800 MG PO TABS
800.0000 mg | ORAL_TABLET | Freq: Three times a day (TID) | ORAL | 0 refills | Status: AC
  Filled 2023-11-21: qty 30, 10d supply, fill #0

## 2023-11-21 NOTE — Progress Notes (Signed)
 Chief Complaint: Left hip pain     History of Present Illness:    Jennifer Noble is a 48 y.o. female presents with left lateral base hip and thigh pain.  She is very active and does require significant walking during her job.  Denies any acute injury or trauma.  She has been participated in physical therapy for strengthening without any relief.  She has had 2 trochanteric injections without permanent relief.  She is here today for further discussion.  She has taken anti-inflammatories without any relief.    PMH/PSH/Family History/Social History/Meds/Allergies:    Past Medical History:  Diagnosis Date   Anxiety    Depression    Dysuria    Hemorrhoid    High cholesterol    Past Surgical History:  Procedure Laterality Date   HEMORRHOID SURGERY     Social History   Socioeconomic History   Marital status: Married    Spouse name: Not on file   Number of children: 3   Years of education: Not on file   Highest education level: 6th grade  Occupational History   Not on file  Tobacco Use   Smoking status: Never   Smokeless tobacco: Never  Vaping Use   Vaping status: Never Used  Substance and Sexual Activity   Alcohol use: No   Drug use: No   Sexual activity: Yes    Birth control/protection: Post-menopausal  Other Topics Concern   Not on file  Social History Narrative   Not on file   Social Drivers of Health   Financial Resource Strain: Not on file  Food Insecurity: Food Insecurity Present (12/03/2022)   Hunger Vital Sign    Worried About Running Out of Food in the Last Year: Sometimes true    Ran Out of Food in the Last Year: Sometimes true  Transportation Needs: No Transportation Needs (12/03/2022)   PRAPARE - Administrator, Civil Service (Medical): No    Lack of Transportation (Non-Medical): No  Physical Activity: Not on file  Stress: Not on file  Social Connections: Not on file   Family History  Problem Relation Age of Onset    Diabetes Mother    Diabetes Sister    Diabetes Brother    Heart disease Other    Breast cancer Neg Hx    Allergies  Allergen Reactions   Naproxen Nausea And Vomiting   Current Outpatient Medications  Medication Sig Dispense Refill   acetaminophen (TYLENOL) 500 MG tablet Take 1 tablet (500 mg total) by mouth every 8 (eight) hours for 10 days. 30 tablet 0   aspirin EC 325 MG tablet Take 1 tablet (325 mg total) by mouth daily. 14 tablet 0   ibuprofen (ADVIL) 800 MG tablet Take 1 tablet (800 mg total) by mouth every 8 (eight) hours for 10 days. Please take with food, please alternate with acetaminophen 30 tablet 0   oxyCODONE (ROXICODONE) 5 MG immediate release tablet Take 1 tablet (5 mg total) by mouth every 4 (four) hours as needed for severe pain (pain score 7-10) or breakthrough pain. 15 tablet 0   benzonatate (TESSALON) 100 MG capsule Take 1 capsule (100 mg total) by mouth every 8 (eight) hours. 21 capsule 0   citalopram (CELEXA) 20 MG tablet Take 1 tablet (20 mg total) by mouth daily. 90 tablet 3   fluticasone (FLONASE) 50 MCG/ACT nasal spray Place 2 sprays into both nostrils daily. 16 g 0   gabapentin (NEURONTIN) 300 MG capsule Take  1 capsule (300 mg total) by mouth 2 (two) times daily. 60 capsule 1   nitroGLYCERIN (NITRODUR - DOSED IN MG/24 HR) 0.2 mg/hr patch Cut patch into fourths. Place 1/4 patch over affected area on hip. Change every 24 hours. 30 patch 0   No current facility-administered medications for this visit.   No results found.  Review of Systems:   A ROS was performed including pertinent positives and negatives as documented in the HPI.  Physical Exam :   Constitutional: NAD and appears stated age Neurological: Alert and oriented Psych: Appropriate affect and cooperative Last menstrual period 02/27/2021.   Comprehensive Musculoskeletal Exam:    Left hip with lateral based pain over the greater trochanter.  Mildly positive Trendelenburg gait.  30 degrees internal  rotation and external rotation of the left hip without significant pain.  Distal neurosensory exam is intact.   Imaging:   Xray (4 views left hip): Normal  MRI (left hip): Gluteus minimus full-thickness tear with undersurface gluteus medius tear without significant atrophy on T1 view   I personally reviewed and interpreted the radiographs.   Assessment and Plan:   48 y.o. female with left hip trochanteric pain consistent with gluteus medius tearing.  At this time she has trialed physical therapy.  She has had 2 trochanteric injections.  She is not getting persistent relief from this.  Given this we did discuss the possibility of gluteus medius repair.  I discussed risk and limitations associated with this.  After discussion she has elected for this  -plan for left hip gluteus medius repair with possible collagen patch augmentation   After a lengthy discussion of treatment options, including risks, benefits, alternatives, complications of surgical and nonsurgical conservative options, the patient elected surgical repair.   The patient  is aware of the material risks  and complications including, but not limited to injury to adjacent structures, neurovascular injury, infection, numbness, bleeding, implant failure, thermal burns, stiffness, persistent pain, failure to heal, disease transmission from allograft, need for further surgery, dislocation, anesthetic risks, blood clots, risks of death,and others. The probabilities of surgical success and failure discussed with patient given their particular co-morbidities.The time and nature of expected rehabilitation and recovery was discussed.The patient's questions were all answered preoperatively.  No barriers to understanding were noted. I explained the natural history of the disease process and Rx rationale.  I explained to the patient what I considered to be reasonable expectations given their personal situation.  The final treatment plan was  arrived at through a shared patient decision making process model.    I personally saw and evaluated the patient, and participated in the management and treatment plan.  Huel Cote, MD Attending Physician, Orthopedic Surgery  This document was dictated using Dragon voice recognition software. A reasonable attempt at proof reading has been made to minimize errors.

## 2023-12-05 ENCOUNTER — Other Ambulatory Visit: Payer: Self-pay | Admitting: Family Medicine

## 2023-12-05 DIAGNOSIS — F419 Anxiety disorder, unspecified: Secondary | ICD-10-CM

## 2023-12-08 ENCOUNTER — Other Ambulatory Visit: Payer: Self-pay

## 2023-12-09 ENCOUNTER — Other Ambulatory Visit: Payer: Self-pay

## 2023-12-09 ENCOUNTER — Other Ambulatory Visit: Payer: Self-pay | Admitting: Nurse Practitioner

## 2023-12-09 DIAGNOSIS — F32A Depression, unspecified: Secondary | ICD-10-CM

## 2023-12-09 DIAGNOSIS — F419 Anxiety disorder, unspecified: Secondary | ICD-10-CM

## 2023-12-09 NOTE — Telephone Encounter (Signed)
 Copied from CRM [id number]. Topic: Clinical - Medication Refill >> Dec 09, 2023  4:59 PM Rennis Case wrote: Most Recent Primary Care Visit:  Provider: Fritzi Jewels  Department: [facility]  Visit Type: NURSE VISIT  Date: 10/27/2023  Medication: citalopram  (CELEXA ) 20 MG tablet Patient requesting PCP take over rx. Patient close to running out. Patient scheduled OV w/ Zelda on 12/26/23.   Has the patient contacted their pharmacy? Yes  Is this the correct pharmacy for this prescription? Yes  This is the patient's preferred pharmacy:   [facility] [address], [address] [place] [address] Phone: [phone] Fax: [phone]  Has the prescription been filled recently? No  Is the patient out of the medication? No, only has one.   Has the patient been seen for an appointment in the last year OR does the patient have an upcoming appointment? Yes  Can we respond through MyChart? No, prefers a phone call  Agent: Please be advised that Rx refills may take up to 3 business days. We ask that you follow-up with your pharmacy.

## 2023-12-10 NOTE — Telephone Encounter (Signed)
 Requested medication (s) are due for refill today:   Yes  Requested medication (s) are on the active medication list:   Yes  Future visit scheduled:   Yes. 5/9/ with Winda Hastings   LOV 03/28/2023 with Winda Hastings   Last ordered: 12/03/2022 #90, 3 refills  Returned for Winda Hastings to review for refills prior to upcoming appt on 5/9.    This last prescribed by Dr. Freddy Jain at [facility].   Requesting Jennifer Noble take over prescribing this.   Requested Prescriptions  Pending Prescriptions Disp Refills   citalopram  (CELEXA ) 20 MG tablet 90 tablet 3    Sig: Take 1 tablet (20 mg total) by mouth daily.     Psychiatry:  Antidepressants - SSRI Failed - 12/10/2023  1:08 PM      Failed - Valid encounter within last 6 months    Recent Outpatient Visits           8 months ago Bacterial sinusitis   [facility] [place] Collins Dean, NP   1 year ago Trochanteric bursitis of left hip   [facility] [place]. [facility] [place], [place], [place]   1 year ago Encounter for annual physical exam   [facility] [place]. [facility] Collins Dean, NP   2 years ago Physical exam   [facility] [place]. [facility] Collins Dean, NP   3 years ago Prediabetes   [facility] [place]. [facility] [place], [place], [place]              Passed - Completed PHQ-2 or PHQ-9 in the last 360 days

## 2023-12-12 ENCOUNTER — Telehealth: Payer: Self-pay

## 2023-12-12 ENCOUNTER — Other Ambulatory Visit: Payer: Self-pay

## 2023-12-12 ENCOUNTER — Other Ambulatory Visit: Payer: Self-pay | Admitting: Family Medicine

## 2023-12-12 ENCOUNTER — Other Ambulatory Visit: Payer: Self-pay | Admitting: Nurse Practitioner

## 2023-12-12 DIAGNOSIS — F419 Anxiety disorder, unspecified: Secondary | ICD-10-CM

## 2023-12-12 MED ORDER — CITALOPRAM HYDROBROMIDE 20 MG PO TABS
20.0000 mg | ORAL_TABLET | Freq: Every day | ORAL | 3 refills | Status: DC
  Filled 2023-12-12: qty 90, 90d supply, fill #0
  Filled 2024-03-09: qty 90, 90d supply, fill #1
  Filled 2024-06-07: qty 90, 90d supply, fill #2

## 2023-12-12 NOTE — Telephone Encounter (Signed)
 SENT

## 2023-12-12 NOTE — Telephone Encounter (Signed)
 Patient is requesting refills on citalopram  (CELEXA ) 20 MG tablet  advised patient her LOV was in [place] of 2024 and she will possibly have to wait till her next appointment in May.

## 2023-12-12 NOTE — Telephone Encounter (Signed)
 Unable to reach patient by phone

## 2023-12-16 ENCOUNTER — Other Ambulatory Visit: Payer: Self-pay

## 2023-12-26 ENCOUNTER — Ambulatory Visit: Payer: Self-pay | Admitting: Nurse Practitioner

## 2023-12-29 ENCOUNTER — Encounter (HOSPITAL_BASED_OUTPATIENT_CLINIC_OR_DEPARTMENT_OTHER): Payer: Self-pay | Admitting: Orthopaedic Surgery

## 2023-12-29 ENCOUNTER — Other Ambulatory Visit: Payer: Self-pay

## 2023-12-31 ENCOUNTER — Ambulatory Visit (HOSPITAL_BASED_OUTPATIENT_CLINIC_OR_DEPARTMENT_OTHER): Payer: Self-pay | Admitting: Orthopaedic Surgery

## 2023-12-31 DIAGNOSIS — S76012D Strain of muscle, fascia and tendon of left hip, subsequent encounter: Secondary | ICD-10-CM

## 2024-01-02 ENCOUNTER — Ambulatory Visit: Payer: Self-pay | Attending: Nurse Practitioner | Admitting: Nurse Practitioner

## 2024-01-02 ENCOUNTER — Encounter: Payer: Self-pay | Admitting: Nurse Practitioner

## 2024-01-02 VITALS — BP 115/72 | HR 82 | Resp 19 | Ht 62.5 in | Wt 175.6 lb

## 2024-01-02 DIAGNOSIS — D72829 Elevated white blood cell count, unspecified: Secondary | ICD-10-CM

## 2024-01-02 DIAGNOSIS — F32A Depression, unspecified: Secondary | ICD-10-CM

## 2024-01-02 DIAGNOSIS — Z1211 Encounter for screening for malignant neoplasm of colon: Secondary | ICD-10-CM

## 2024-01-02 DIAGNOSIS — R7303 Prediabetes: Secondary | ICD-10-CM

## 2024-01-02 DIAGNOSIS — F418 Other specified anxiety disorders: Secondary | ICD-10-CM

## 2024-01-02 DIAGNOSIS — Z1231 Encounter for screening mammogram for malignant neoplasm of breast: Secondary | ICD-10-CM

## 2024-01-02 DIAGNOSIS — E785 Hyperlipidemia, unspecified: Secondary | ICD-10-CM

## 2024-01-02 DIAGNOSIS — F419 Anxiety disorder, unspecified: Secondary | ICD-10-CM

## 2024-01-02 NOTE — Progress Notes (Signed)
 Assessment & Plan:   Jennifer Noble was seen today for medication refill and anxiety.  Diagnoses and all orders for this visit:  Anxiety and depression Continue citalopram  20 mg daily as prescribed  Prediabetes -     CMP14+EGFR -     Hemoglobin A1c  Dyslipidemia, goal LDL below 70 -     Lipid panel The 10-year ASCVD risk score (Arnett DK, et al., 2019) is: 1.1%   Values used to calculate the score:     Age: 48 years     Sex: Female     Is Non-Hispanic African American: No     Diabetic: No     Tobacco smoker: No     Systolic Blood Pressure: 115 mmHg     Is BP treated: No     HDL Cholesterol: 47 mg/dL     Total Cholesterol: 222 mg/dL   Leukocytosis, unspecified type -     CBC with Differential/Platelet  Colon cancer screening -     Fecal occult blood, imunochemical([facility])  Breast cancer screening by mammogram -     MS 3D SCR MAMMO BILAT BR (aka MM); Future    Patient has been counseled on age-appropriate routine health concerns for screening and prevention. These are reviewed and up-to-date. Referrals have been placed accordingly. Immunizations are up-to-date or declined.    Subjective:   Chief Complaint  Patient presents with   Medication Refill   Anxiety    Jennifer Noble 48 y.o. female presents to office today for anxiety and depression  VRI was used to communicate directly with patient for the entire encounter including providing detailed patient instructions.     She is currently taking citalopram  20 mg daily and has been taking this medication for several years.  She wants to know if she should "come off".  She does states she attempted to stop taking her medication years ago and her depression and anxiety returned.  We did discuss her PHQ-9 and GAD scores and at this time I will recommend continuing Celexa .    01/02/2024    9:42 AM 03/28/2023   11:34 AM 12/05/2022    3:18 PM 12/03/2022    1:21 PM  GAD 7 : Generalized Anxiety Score   Nervous, Anxious, on Edge 0 2 1 2   Control/stop worrying 2 1 1 1   Worry too much - different things 2 0 1 1  Trouble relaxing 2 0 1 1  Restless 2 0 1 1  Easily annoyed or irritable 2 0 1 1  Afraid - awful might happen 2 2 0 0  Total GAD 7 Score 12 5 6 7   Anxiety Difficulty Somewhat difficult           01/02/2024    9:35 AM 03/28/2023   11:34 AM 12/05/2022    3:17 PM  Depression screen PHQ 2/9  Decreased Interest 2 2 2   Down, Depressed, Hopeless 2 2 1   PHQ - 2 Score 4 4 3   Altered sleeping 2 2 1   Tired, decreased energy 2 3 3   Change in appetite 2 3 1   Feeling bad or failure about yourself  0 0 0  Trouble concentrating 0 0 1  Moving slowly or fidgety/restless 0 0 1  Suicidal thoughts 0 0 0  PHQ-9 Score 10 12 10   Difficult doing work/chores Somewhat difficult      Review of Systems  Constitutional:  Negative for fever, malaise/fatigue and weight loss.  HENT: Negative.  Negative for nosebleeds.   Eyes:  Negative.  Negative for blurred vision, double vision and photophobia.  Respiratory: Negative.  Negative for cough and shortness of breath.   Cardiovascular: Negative.  Negative for chest pain, palpitations and leg swelling.  Gastrointestinal: Negative.  Negative for heartburn, nausea and vomiting.  Musculoskeletal: Negative.  Negative for myalgias.  Neurological: Negative.  Negative for dizziness, focal weakness, seizures and headaches.  Psychiatric/Behavioral:  Positive for depression. Negative for suicidal ideas. The patient is nervous/anxious.     Past Medical History:  Diagnosis Date   Anxiety    Depression    Dysuria    Hemorrhoid    High cholesterol     Past Surgical History:  Procedure Laterality Date   HEMORRHOID SURGERY      Family History  Problem Relation Age of Onset   Diabetes Mother    Diabetes Sister    Diabetes Brother    Heart disease Other    Breast cancer Neg Hx     Social History Reviewed with no changes to be made today.   Outpatient  Medications Prior to Visit  Medication Sig Dispense Refill   aspirin  EC 325 MG tablet Take 1 tablet (325 mg total) by mouth daily. 14 tablet 0   citalopram  (CELEXA ) 20 MG tablet Take 1 tablet (20 mg total) by mouth daily. 90 tablet 3   oxyCODONE  (ROXICODONE ) 5 MG immediate release tablet Take 1 tablet (5 mg total) by mouth every 4 (four) hours as needed for severe pain (pain score 7-10) or breakthrough pain. 15 tablet 0   gabapentin  (NEURONTIN ) 300 MG capsule Take 1 capsule (300 mg total) by mouth 2 (two) times daily. (Patient not taking: Reported on 01/02/2024) 60 capsule 1   nitroGLYCERIN  (NITRODUR - DOSED IN MG/24 HR) 0.2 mg/hr patch Cut patch into fourths. Place 1/4 patch over affected area on hip. Change every 24 hours. (Patient not taking: Reported on 01/02/2024) 30 patch 0   No facility-administered medications prior to visit.    Allergies  Allergen Reactions   Naproxen  Nausea And Vomiting       Objective:    BP 115/72 (BP Location: Left Arm, Patient Position: Sitting, Cuff Size: Normal)   Pulse 82   Resp 19   Ht 5' 2.5" (1.588 m)   Wt 175 lb 9.6 oz (79.7 kg)   LMP 02/27/2021 (Approximate)   SpO2 98%   BMI 31.61 kg/m  Wt Readings from Last 3 Encounters:  01/02/24 175 lb 9.6 oz (79.7 kg)  10/04/23 164 lb 14.5 oz (74.8 kg)  03/28/23 164 lb 12.8 oz (74.8 kg)    Physical Exam Vitals and nursing note reviewed.  Constitutional:      Appearance: She is well-developed.  HENT:     Head: Normocephalic and atraumatic.  Cardiovascular:     Rate and Rhythm: Normal rate and regular rhythm.     Heart sounds: Normal heart sounds. No murmur heard.    No friction rub. No gallop.  Pulmonary:     Effort: Pulmonary effort is normal. No tachypnea or respiratory distress.     Breath sounds: Normal breath sounds. No decreased breath sounds, wheezing, rhonchi or rales.  Chest:     Chest wall: No tenderness.  Abdominal:     General: Bowel sounds are normal.     Palpations: Abdomen is  soft.  Musculoskeletal:        General: Normal range of motion.     Cervical back: Normal range of motion.  Skin:    General: Skin is warm  and dry.  Neurological:     Mental Status: She is alert and oriented to person, place, and time.     Coordination: Coordination normal.  Psychiatric:        Behavior: Behavior normal. Behavior is cooperative.        Thought Content: Thought content normal.        Judgment: Judgment normal.          Patient has been counseled extensively about nutrition and exercise as well as the importance of adherence with medications and regular follow-up. The patient was given clear instructions to go to ER or return to medical center if symptoms don't improve, worsen or new problems develop. The patient verbalized understanding.   Follow-up: Return in about 6 months (around 07/04/2024).   Collins Dean, FNP-BC [facility] [place], [place] [phone]   01/02/2024, 1:41 PM

## 2024-01-03 ENCOUNTER — Ambulatory Visit: Payer: Self-pay | Admitting: Nurse Practitioner

## 2024-01-03 LAB — CBC WITH DIFFERENTIAL/PLATELET
Basophils Absolute: 0 x10E3/uL (ref 0.0–0.2)
Basos: 1 %
EOS (ABSOLUTE): 0.2 x10E3/uL (ref 0.0–0.4)
Eos: 2 %
Hematocrit: 43.5 % (ref 34.0–46.6)
Hemoglobin: 14.2 g/dL (ref 11.1–15.9)
Immature Grans (Abs): 0 x10E3/uL (ref 0.0–0.1)
Immature Granulocytes: 0 %
Lymphocytes Absolute: 3.5 x10E3/uL — ABNORMAL HIGH (ref 0.7–3.1)
Lymphs: 46 %
MCH: 29.6 pg (ref 26.6–33.0)
MCHC: 32.6 g/dL (ref 31.5–35.7)
MCV: 91 fL (ref 79–97)
Monocytes Absolute: 0.5 x10E3/uL (ref 0.1–0.9)
Monocytes: 6 %
Neutrophils Absolute: 3.5 x10E3/uL (ref 1.4–7.0)
Neutrophils: 45 %
Platelets: 331 x10E3/uL (ref 150–450)
RBC: 4.79 x10E6/uL (ref 3.77–5.28)
RDW: 12.7 % (ref 11.7–15.4)
WBC: 7.7 x10E3/uL (ref 3.4–10.8)

## 2024-01-03 LAB — CMP14+EGFR
ALT: 102 IU/L — ABNORMAL HIGH (ref 0–32)
AST: 71 IU/L — ABNORMAL HIGH (ref 0–40)
Albumin: 4.7 g/dL (ref 3.9–4.9)
Alkaline Phosphatase: 93 IU/L (ref 44–121)
BUN/Creatinine Ratio: 28 — ABNORMAL HIGH (ref 9–23)
BUN: 15 mg/dL (ref 6–24)
Bilirubin Total: 0.4 mg/dL (ref 0.0–1.2)
CO2: 20 mmol/L (ref 20–29)
Calcium: 9.7 mg/dL (ref 8.7–10.2)
Chloride: 102 mmol/L (ref 96–106)
Creatinine, Ser: 0.53 mg/dL — ABNORMAL LOW (ref 0.57–1.00)
Globulin, Total: 3.1 g/dL (ref 1.5–4.5)
Glucose: 123 mg/dL — ABNORMAL HIGH (ref 70–99)
Potassium: 4.6 mmol/L (ref 3.5–5.2)
Sodium: 139 mmol/L (ref 134–144)
Total Protein: 7.8 g/dL (ref 6.0–8.5)
eGFR: 115 mL/min/1.73 (ref >59)

## 2024-01-03 LAB — LIPID PANEL
Chol/HDL Ratio: 4.3 ratio (ref 0.0–4.4)
Cholesterol, Total: 227 mg/dL — ABNORMAL HIGH (ref 100–199)
HDL: 53 mg/dL (ref >39)
LDL Chol Calc (NIH): 147 mg/dL — ABNORMAL HIGH (ref 0–99)
Triglycerides: 150 mg/dL — ABNORMAL HIGH (ref 0–149)
VLDL Cholesterol Cal: 27 mg/dL (ref 5–40)

## 2024-01-03 LAB — HEMOGLOBIN A1C
Est. average glucose Bld gHb Est-mCnc: 146 mg/dL
Hgb A1c MFr Bld: 6.7 % — ABNORMAL HIGH (ref 4.8–5.6)

## 2024-01-05 ENCOUNTER — Ambulatory Visit (HOSPITAL_BASED_OUTPATIENT_CLINIC_OR_DEPARTMENT_OTHER): Payer: Self-pay | Admitting: Anesthesiology

## 2024-01-05 ENCOUNTER — Other Ambulatory Visit: Payer: Self-pay

## 2024-01-05 ENCOUNTER — Encounter (HOSPITAL_BASED_OUTPATIENT_CLINIC_OR_DEPARTMENT_OTHER)
Admission: RE | Disposition: A | Discharge status: Home / Self Care | Payer: Self-pay | Source: Home / Self Care | Attending: Orthopaedic Surgery

## 2024-01-05 ENCOUNTER — Encounter (HOSPITAL_BASED_OUTPATIENT_CLINIC_OR_DEPARTMENT_OTHER): Payer: Self-pay | Admitting: Orthopaedic Surgery

## 2024-01-05 ENCOUNTER — Ambulatory Visit (HOSPITAL_BASED_OUTPATIENT_CLINIC_OR_DEPARTMENT_OTHER)
Admission: RE | Admit: 2024-01-05 | Discharge: 2024-01-05 | Disposition: A | Discharge status: Home / Self Care | Payer: Self-pay | Attending: Orthopaedic Surgery | Admitting: Orthopaedic Surgery

## 2024-01-05 DIAGNOSIS — S76012A Strain of muscle, fascia and tendon of left hip, initial encounter: Secondary | ICD-10-CM | POA: Insufficient documentation

## 2024-01-05 DIAGNOSIS — X58XXXA Exposure to other specified factors, initial encounter: Secondary | ICD-10-CM | POA: Insufficient documentation

## 2024-01-05 DIAGNOSIS — S76012D Strain of muscle, fascia and tendon of left hip, subsequent encounter: Secondary | ICD-10-CM

## 2024-01-05 DIAGNOSIS — F419 Anxiety disorder, unspecified: Secondary | ICD-10-CM | POA: Insufficient documentation

## 2024-01-05 DIAGNOSIS — F32A Depression, unspecified: Secondary | ICD-10-CM | POA: Insufficient documentation

## 2024-01-05 HISTORY — PX: GLUTEUS MINIMUS REPAIR: SHX5843

## 2024-01-05 SURGERY — REPAIR, TENDON, GLUTEUS MINIMUS
Anesthesia: General | Site: Hip | Laterality: Left

## 2024-01-05 MED ORDER — ROCURONIUM BROMIDE 10 MG/ML (PF) SYRINGE
PREFILLED_SYRINGE | INTRAVENOUS | Status: DC | PRN
  Administered 2024-01-05: 50 mg via INTRAVENOUS

## 2024-01-05 MED ORDER — BUPIVACAINE HCL (PF) 0.25 % IJ SOLN
INTRAMUSCULAR | Status: AC
  Filled 2024-01-05: qty 30

## 2024-01-05 MED ORDER — ACETAMINOPHEN 500 MG PO TABS
1000.0000 mg | ORAL_TABLET | Freq: Once | ORAL | Status: AC
  Administered 2024-01-05: 1000 mg via ORAL

## 2024-01-05 MED ORDER — PROPOFOL 10 MG/ML IV BOLUS
INTRAVENOUS | Status: DC | PRN
  Administered 2024-01-05: 150 mg via INTRAVENOUS

## 2024-01-05 MED ORDER — ACETAMINOPHEN 500 MG PO TABS: 1000.0000 mg | ORAL_TABLET | Freq: Once | ORAL | Status: DC

## 2024-01-05 MED ORDER — OXYCODONE HCL 5 MG/5ML PO SOLN: 5.0000 mg | Freq: Once | ORAL | Status: AC | PRN

## 2024-01-05 MED ORDER — ACETAMINOPHEN 500 MG PO TABS
ORAL_TABLET | ORAL | Status: AC
  Filled 2024-01-05: qty 2

## 2024-01-05 MED ORDER — SUGAMMADEX SODIUM 200 MG/2ML IV SOLN
INTRAVENOUS | Status: DC | PRN
  Administered 2024-01-05: 200 mg via INTRAVENOUS

## 2024-01-05 MED ORDER — KETOROLAC TROMETHAMINE 30 MG/ML IJ SOLN
30.0000 mg | Freq: Once | INTRAMUSCULAR | Status: AC | PRN
  Administered 2024-01-05: 30 mg via INTRAVENOUS

## 2024-01-05 MED ORDER — MEPERIDINE HCL 25 MG/ML IJ SOLN: 6.2500 mg | INTRAMUSCULAR | Status: DC | PRN

## 2024-01-05 MED ORDER — TRANEXAMIC ACID-NACL 1000-0.7 MG/100ML-% IV SOLN
1000.0000 mg | INTRAVENOUS | Status: AC
  Administered 2024-01-05: 1000 mg via INTRAVENOUS

## 2024-01-05 MED ORDER — KETOROLAC TROMETHAMINE 30 MG/ML IJ SOLN
INTRAMUSCULAR | Status: AC
  Filled 2024-01-05: qty 1

## 2024-01-05 MED ORDER — DEXAMETHASONE SODIUM PHOSPHATE 10 MG/ML IJ SOLN
INTRAMUSCULAR | Status: DC | PRN
  Administered 2024-01-05: 5 mg via INTRAVENOUS

## 2024-01-05 MED ORDER — ONDANSETRON HCL 4 MG/2ML IJ SOLN
INTRAMUSCULAR | Status: DC | PRN
  Administered 2024-01-05: 4 mg via INTRAVENOUS

## 2024-01-05 MED ORDER — ONDANSETRON HCL 4 MG/2ML IJ SOLN: 4.0000 mg | Freq: Once | INTRAMUSCULAR | Status: DC | PRN

## 2024-01-05 MED ORDER — BUPIVACAINE HCL 0.25 % IJ SOLN
INTRAMUSCULAR | Status: DC | PRN
  Administered 2024-01-05: 20 mL

## 2024-01-05 MED ORDER — CEFAZOLIN SODIUM-DEXTROSE 2-4 GM/100ML-% IV SOLN
INTRAVENOUS | Status: AC
  Filled 2024-01-05: qty 100

## 2024-01-05 MED ORDER — FENTANYL CITRATE (PF) 100 MCG/2ML IJ SOLN
INTRAMUSCULAR | Status: AC
  Filled 2024-01-05: qty 2

## 2024-01-05 MED ORDER — HYDROMORPHONE HCL 1 MG/ML IJ SOLN
INTRAMUSCULAR | Status: AC
  Filled 2024-01-05: qty 0.5

## 2024-01-05 MED ORDER — FENTANYL CITRATE (PF) 100 MCG/2ML IJ SOLN
INTRAMUSCULAR | Status: DC | PRN
  Administered 2024-01-05: 100 ug via INTRAVENOUS

## 2024-01-05 MED ORDER — CEFAZOLIN SODIUM-DEXTROSE 2-4 GM/100ML-% IV SOLN
2.0000 g | INTRAVENOUS | Status: AC
  Administered 2024-01-05: 2 g via INTRAVENOUS

## 2024-01-05 MED ORDER — MIDAZOLAM HCL 2 MG/2ML IJ SOLN
INTRAMUSCULAR | Status: AC
  Filled 2024-01-05: qty 2

## 2024-01-05 MED ORDER — LIDOCAINE HCL (CARDIAC) PF 100 MG/5ML IV SOSY
PREFILLED_SYRINGE | INTRAVENOUS | Status: DC | PRN
  Administered 2024-01-05: 80 mg via INTRAVENOUS

## 2024-01-05 MED ORDER — GABAPENTIN 300 MG PO CAPS
300.0000 mg | ORAL_CAPSULE | Freq: Once | ORAL | Status: AC
  Administered 2024-01-05: 300 mg via ORAL

## 2024-01-05 MED ORDER — PROPOFOL 500 MG/50ML IV EMUL
INTRAVENOUS | Status: DC | PRN
  Administered 2024-01-05: 35 ug/kg/min via INTRAVENOUS

## 2024-01-05 MED ORDER — LACTATED RINGERS IV SOLN: INTRAVENOUS | Status: DC | PRN

## 2024-01-05 MED ORDER — HYDROMORPHONE HCL 1 MG/ML IJ SOLN
0.2500 mg | INTRAMUSCULAR | Status: DC | PRN
  Administered 2024-01-05: 0.5 mg via INTRAVENOUS

## 2024-01-05 MED ORDER — OXYCODONE HCL 5 MG PO TABS
5.0000 mg | ORAL_TABLET | Freq: Once | ORAL | Status: AC | PRN
  Administered 2024-01-05: 5 mg via ORAL

## 2024-01-05 MED ORDER — ROCURONIUM BROMIDE 10 MG/ML (PF) SYRINGE
PREFILLED_SYRINGE | INTRAVENOUS | Status: AC
  Filled 2024-01-05: qty 10

## 2024-01-05 MED ORDER — TRANEXAMIC ACID-NACL 1000-0.7 MG/100ML-% IV SOLN
INTRAVENOUS | Status: AC
  Filled 2024-01-05: qty 100

## 2024-01-05 MED ORDER — GABAPENTIN 300 MG PO CAPS
ORAL_CAPSULE | ORAL | Status: AC
  Filled 2024-01-05: qty 1

## 2024-01-05 MED ORDER — LACTATED RINGERS IV SOLN: INTRAVENOUS | Status: DC

## 2024-01-05 MED ORDER — CEFAZOLIN SODIUM-DEXTROSE 2-3 GM-%(50ML) IV SOLR: INTRAVENOUS | Status: DC | PRN

## 2024-01-05 MED ORDER — OXYCODONE HCL 5 MG PO TABS
ORAL_TABLET | ORAL | Status: AC
  Filled 2024-01-05: qty 1

## 2024-01-05 MED ORDER — PHENYLEPHRINE HCL (PRESSORS) 10 MG/ML IV SOLN
INTRAVENOUS | Status: DC | PRN
  Administered 2024-01-05 (×2): 160 ug via INTRAVENOUS

## 2024-01-05 MED ORDER — MIDAZOLAM HCL 2 MG/2ML IJ SOLN
INTRAMUSCULAR | Status: DC | PRN
  Administered 2024-01-05: 2 mg via INTRAVENOUS

## 2024-01-05 MED ORDER — 0.9 % SODIUM CHLORIDE (POUR BTL) OPTIME
TOPICAL | Status: DC | PRN
  Administered 2024-01-05: 200 mL

## 2024-01-05 MED ORDER — FENTANYL CITRATE (PF) 100 MCG/2ML IJ SOLN
25.0000 ug | INTRAMUSCULAR | Status: DC | PRN
  Administered 2024-01-05 (×2): 50 ug via INTRAVENOUS

## 2024-01-05 SURGICAL SUPPLY — 48 items
ANCHOR JUGGERKNOT SOFT 1.5 (Anchor) ×2 IMPLANT
ANCHOR JUGGERKNOT SOFT 2.9 (Anchor) IMPLANT
ANCHOR SUT QUATTRO KNTLS 4.5 (Anchor) IMPLANT
BLADE SURG 10 STRL SS (BLADE) ×2 IMPLANT
BLADE SURG 15 STRL LF DISP TIS (BLADE) ×2 IMPLANT
CANISTER SUCT 1200ML W/VALVE (MISCELLANEOUS) ×2 IMPLANT
CHLORAPREP W/TINT 26 (MISCELLANEOUS) ×2 IMPLANT
CLSR STERI-STRIP ANTIMIC 1/2X4 (GAUZE/BANDAGES/DRESSINGS) IMPLANT
COOLER ICEMAN CLASSIC (MISCELLANEOUS) ×2 IMPLANT
COVER BACK TABLE 60X90IN (DRAPES) ×2 IMPLANT
COVER MAYO STAND STRL (DRAPES) ×2 IMPLANT
DERMABOND ADVANCED .7 DNX12 (GAUZE/BANDAGES/DRESSINGS) IMPLANT
DRAPE STERI IOBAN 125X83 (DRAPES) ×2 IMPLANT
DRAPE U-SHAPE 47X51 STRL (DRAPES) ×4 IMPLANT
DRSG AQUACEL AG ADV 3.5X 6 (GAUZE/BANDAGES/DRESSINGS) ×2 IMPLANT
DRSG AQUACEL AG ADV 3.5X10 (GAUZE/BANDAGES/DRESSINGS) IMPLANT
ELECTRODE BLDE 4.0 EZ CLN MEGD (MISCELLANEOUS) IMPLANT
ELECTRODE REM PT RTRN 9FT ADLT (ELECTROSURGICAL) ×2 IMPLANT
GAUZE PAD ABD 8X10 STRL (GAUZE/BANDAGES/DRESSINGS) IMPLANT
GAUZE XEROFORM 1X8 LF (GAUZE/BANDAGES/DRESSINGS) IMPLANT
GLOVE BIO SURGEON STRL SZ 6 (GLOVE) ×4 IMPLANT
GLOVE BIO SURGEON STRL SZ7.5 (GLOVE) ×4 IMPLANT
GLOVE BIOGEL PI IND STRL 6.5 (GLOVE) ×2 IMPLANT
GLOVE BIOGEL PI IND STRL 8 (GLOVE) ×2 IMPLANT
GOWN STRL REUS W/ TWL LRG LVL3 (GOWN DISPOSABLE) ×4 IMPLANT
GOWN STRL REUS W/TWL XL LVL3 (GOWN DISPOSABLE) ×2 IMPLANT
IMPL TAPESTRY BIOINTEGR 30X30 (Orthopedic Implant) IMPLANT
MANIFOLD NEPTUNE II (INSTRUMENTS) ×2 IMPLANT
NDL HYPO 22X1.5 SAFETY MO (MISCELLANEOUS) ×2 IMPLANT
NEEDLE HYPO 22X1.5 SAFETY MO (MISCELLANEOUS) ×1 IMPLANT
NS IRRIG 1000ML POUR BTL (IV SOLUTION) ×2 IMPLANT
PACK BASIN DAY SURGERY FS (CUSTOM PROCEDURE TRAY) ×2 IMPLANT
PAD COLD SHLDR WRAP-ON (PAD) ×2 IMPLANT
PENCIL SMOKE EVACUATOR (MISCELLANEOUS) ×2 IMPLANT
SLEEVE SCD COMPRESS KNEE MED (STOCKING) ×2 IMPLANT
SPIKE FLUID TRANSFER (MISCELLANEOUS) IMPLANT
SPONGE T-LAP 18X18 ~~LOC~~+RFID (SPONGE) ×2 IMPLANT
SUCTION TUBE FRAZIER 10FR DISP (SUCTIONS) ×2 IMPLANT
SUT ETHILON 3 0 PS 1 (SUTURE) IMPLANT
SUT MNCRL AB 3-0 PS2 27 (SUTURE) IMPLANT
SUT VIC AB 0 CT1 27XBRD ANBCTR (SUTURE) ×2 IMPLANT
SUT VIC AB 2-0 CT1 TAPERPNT 27 (SUTURE) ×2 IMPLANT
SYR 20ML LL LF (SYRINGE) ×2 IMPLANT
SYR BULB EAR ULCER 3OZ GRN STR (SYRINGE) ×2 IMPLANT
TOWEL GREEN STERILE FF (TOWEL DISPOSABLE) ×4 IMPLANT
TUBE CONNECTING 20X1/4 (TUBING) ×2 IMPLANT
UNDERPAD 30X36 HEAVY ABSORB (UNDERPADS AND DIAPERS) IMPLANT
YANKAUER SUCT BULB TIP NO VENT (SUCTIONS) ×2 IMPLANT

## 2024-01-05 NOTE — Anesthesia Preprocedure Evaluation (Addendum)
 Anesthesia Evaluation  Patient identified by MRN, date of birth, ID band Patient awake    Reviewed: Allergy & Precautions, H&P , NPO status , Patient's Chart, lab work & pertinent test results  Airway Mallampati: II  TM Distance: >3 FB Neck ROM: Full    Dental no notable dental hx.    Pulmonary neg pulmonary ROS   Pulmonary exam normal breath sounds clear to auscultation       Cardiovascular Exercise Tolerance: Good negative cardio ROS Normal cardiovascular exam Rhythm:Regular Rate:Normal     Neuro/Psych  Headaches PSYCHIATRIC DISORDERS Anxiety Depression    negative neurological ROS  negative psych ROS   GI/Hepatic negative GI ROS, Neg liver ROS,,,  Endo/Other  negative endocrine ROS    Renal/GU negative Renal ROS  negative genitourinary   Musculoskeletal negative musculoskeletal ROS (+)    Abdominal   Peds negative pediatric ROS (+)  Hematology negative hematology ROS (+)   Anesthesia Other Findings   Reproductive/Obstetrics negative OB ROS                             Anesthesia Physical Anesthesia Plan  ASA: 2  Anesthesia Plan: General   Post-op Pain Management: Tylenol  PO (pre-op)*   Induction: Intravenous  PONV Risk Score and Plan: 3 and Ondansetron , Dexamethasone  and Treatment may vary due to age or medical condition  Airway Management Planned: Oral ETT and LMA  Additional Equipment: None  Intra-op Plan:   Post-operative Plan: Extubation in OR  Informed Consent: I have reviewed the patients History and Physical, chart, labs and discussed the procedure including the risks, benefits and alternatives for the proposed anesthesia with the patient or authorized representative who has indicated his/her understanding and acceptance.       Plan Discussed with: Anesthesiologist and CRNA  Anesthesia Plan Comments: (  )       Anesthesia Quick Evaluation

## 2024-01-05 NOTE — Anesthesia Procedure Notes (Signed)
 Procedure Name: Intubation Date/Time: 01/05/2024 12:21 PM  Performed by: Raymona Caldwell, CRNAPre-anesthesia Checklist: Patient identified, Emergency Drugs available, Suction available and Patient being monitored Patient Re-evaluated:Patient Re-evaluated prior to induction Oxygen Delivery Method: Circle system utilized Preoxygenation: Pre-oxygenation with 100% oxygen Induction Type: IV induction Ventilation: Mask ventilation without difficulty Laryngoscope Size: Mac and 4 Grade View: Grade I Tube type: Oral Tube size: 7.0 mm Number of attempts: 1 Airway Equipment and Method: Stylet and Oral airway Placement Confirmation: ETT inserted through vocal cords under direct vision, positive ETCO2, breath sounds checked- equal and bilateral and CO2 detector Secured at: 23 cm Tube secured with: Tape Dental Injury: Teeth and Oropharynx as per pre-operative assessment

## 2024-01-05 NOTE — Transfer of Care (Signed)
 Immediate Anesthesia Transfer of Care Note  Patient: Jennifer Noble  Procedure(s) Performed: REPAIR, TENDON, GLUTEUS MINIMUS (Left: Hip)  Patient Location: PACU  Anesthesia Type:General and Regional  Level of Consciousness: awake, alert , and oriented  Airway & Oxygen Therapy: Patient Spontanous Breathing and Patient connected to face mask oxygen  Post-op Assessment: Report given to RN and Post -op Vital signs reviewed and stable  Post vital signs: Reviewed and stable  Last Vitals:  Vitals Value Taken Time  BP 131/76 01/05/24 1309  Temp    Pulse 95 01/05/24 1311  Resp    SpO2 100 % 01/05/24 1311  Vitals shown include unfiled device data.  Last Pain:  Vitals:   01/05/24 1045  TempSrc: Temporal  PainSc: 5       Patients Stated Pain Goal: 3 (01/05/24 1045)  Complications: No notable events documented.

## 2024-01-05 NOTE — H&P (Signed)
 Expand All Collapse All       Chief Complaint: Left hip pain        History of Present Illness:      Jennifer Noble Jennifer Noble is a 48 y.o. female presents with left lateral base hip and thigh pain.  She is very active and does require significant walking during her job.  Denies any acute injury or trauma.  She has been participated in physical therapy for strengthening without any relief.  She has had 2 trochanteric injections without permanent relief.  She is here today for further discussion.  She has taken anti-inflammatories without any relief.       PMH/PSH/Family History/Social History/Meds/Allergies:         Past Medical History:  Diagnosis Date   Anxiety     Depression     Dysuria     Hemorrhoid     High cholesterol               Past Surgical History:  Procedure Laterality Date   HEMORRHOID SURGERY            Social History         Socioeconomic History   Marital status: Married      Spouse name: Not on file   Number of children: 3   Years of education: Not on file   Highest education level: 6th grade  Occupational History   Not on file  Tobacco Use   Smoking status: Never   Smokeless tobacco: Never  Vaping Use   Vaping status: Never Used  Substance and Sexual Activity   Alcohol use: No   Drug use: No   Sexual activity: Yes      Birth control/protection: Post-menopausal  Other Topics Concern   Not on file  Social History Narrative   Not on file    Social Drivers of Health        Financial Resource Strain: Not on file  Food Insecurity: Food Insecurity Present (12/03/2022)    Hunger Vital Sign     Worried About Running Out of Food in the Last Year: Sometimes true     Ran Out of Food in the Last Year: Sometimes true  Transportation Needs: No Transportation Needs (12/03/2022)    PRAPARE - Therapist, art (Medical): No     Lack of Transportation (Non-Medical): No  Physical Activity: Not on file  Stress: Not on  file  Social Connections: Not on file         Family History  Problem Relation Age of Onset   Diabetes Mother     Diabetes Sister     Diabetes Brother     Heart disease Other     Breast cancer Neg Hx          Allergies      Allergies  Allergen Reactions   Naproxen  Nausea And Vomiting            Current Outpatient Medications  Medication Sig Dispense Refill   acetaminophen  (TYLENOL ) 500 MG tablet Take 1 tablet (500 mg total) by mouth every 8 (eight) hours for 10 days. 30 tablet 0   aspirin  EC 325 MG tablet Take 1 tablet (325 mg total) by mouth daily. 14 tablet 0   ibuprofen  (ADVIL ) 800 MG tablet Take 1 tablet (800 mg total) by mouth every 8 (eight) hours for 10 days. Please take with food, please alternate with acetaminophen  30 tablet 0   oxyCODONE  (ROXICODONE ) 5 MG  immediate release tablet Take 1 tablet (5 mg total) by mouth every 4 (four) hours as needed for severe pain (pain score 7-10) or breakthrough pain. 15 tablet 0   benzonatate  (TESSALON ) 100 MG capsule Take 1 capsule (100 mg total) by mouth every 8 (eight) hours. 21 capsule 0   citalopram  (CELEXA ) 20 MG tablet Take 1 tablet (20 mg total) by mouth daily. 90 tablet 3   fluticasone  (FLONASE ) 50 MCG/ACT nasal spray Place 2 sprays into both nostrils daily. 16 g 0   gabapentin  (NEURONTIN ) 300 MG capsule Take 1 capsule (300 mg total) by mouth 2 (two) times daily. 60 capsule 1   nitroGLYCERIN  (NITRODUR - DOSED IN MG/24 HR) 0.2 mg/hr patch Cut patch into fourths. Place 1/4 patch over affected area on hip. Change every 24 hours. 30 patch 0      No current facility-administered medications for this visit.      Imaging Results (Last 48 hours)  No results found.     Review of Systems:   A ROS was performed including pertinent positives and negatives as documented in the HPI.   Physical Exam :   Constitutional: NAD and appears stated age Neurological: Alert and oriented Psych: Appropriate affect and cooperative Last  menstrual period 02/27/2021.    Comprehensive Musculoskeletal Exam:     Left hip with lateral based pain over the greater trochanter.  Mildly positive Trendelenburg gait.  30 degrees internal rotation and external rotation of the left hip without significant pain.  Distal neurosensory exam is intact.     Imaging:   Xray (4 views left hip): Normal   MRI (left hip): Gluteus minimus full-thickness tear with undersurface gluteus medius tear without significant atrophy on T1 view     I personally reviewed and interpreted the radiographs.     Assessment and Plan:   48 y.o. female with left hip trochanteric pain consistent with gluteus medius tearing.  At this time she has trialed physical therapy.  She has had 2 trochanteric injections.  She is not getting persistent relief from this.  Given this we did discuss the possibility of gluteus medius repair.  I discussed risk and limitations associated with this.  After discussion she has elected for this   -plan for left hip gluteus medius repair with possible collagen patch augmentation     After a lengthy discussion of treatment options, including risks, benefits, alternatives, complications of surgical and nonsurgical conservative options, the patient elected surgical repair.    The patient  is aware of the material risks  and complications including, but not limited to injury to adjacent structures, neurovascular injury, infection, numbness, bleeding, implant failure, thermal burns, stiffness, persistent pain, failure to heal, disease transmission from allograft, need for further surgery, dislocation, anesthetic risks, blood clots, risks of death,and others. The probabilities of surgical success and failure discussed with patient given their particular co-morbidities.The time and nature of expected rehabilitation and recovery was discussed.The patient's questions were all answered preoperatively.  No barriers to understanding were noted. I  explained the natural history of the disease process and Rx rationale.  I explained to the patient what I considered to be reasonable expectations given their personal situation.  The final treatment plan was arrived at through a shared patient decision making process model.       I personally saw and evaluated the patient, and participated in the management and treatment plan.   Wilhelmenia Harada, MD Attending Physician, Orthopedic Surgery   This document was dictated using  Dragon [employer] engineer. A reasonable attempt at proof reading has been made to minimize errors.

## 2024-01-05 NOTE — Op Note (Signed)
 Date of Surgery: 01/05/2024  INDICATIONS: [name] is a 48 y.o.-year-old female with left hip gluteus medius tear.  The risk and benefits of the procedure were discussed in detail and documented in the pre-operative evaluation.   PREOPERATIVE DIAGNOSIS: 1. Left hip gluteus medius tear  POSTOPERATIVE DIAGNOSIS: Same.  PROCEDURE: 1. Left hip gluteus medius repair 2. Left hip trochanteric  bursectomy  SURGEON: Carmina Chris MD  ASSISTANT: Deon Flatter, ATC  ANESTHESIA:  general  IV FLUIDS AND URINE: See anesthesia record.  ANTIBIOTICS: Ancef   ESTIMATED BLOOD LOSS: 10 mL.  IMPLANTS:  Implant Name Type Inv. Item Serial No. Manufacturer Lot No. LRB No. Used Action  ANCHOR SUT QUATTRO KNTLS 4.5 - [id number] Anchor ANCHOR SUT QUATTRO KNTLS 4.5  [employer](ORTH,TRAU,BIO,SG) [id number] Left 1 Implanted  ANCHOR SUT QUATTRO KNTLS 4.5 - [id number] Anchor ANCHOR SUT QUATTRO KNTLS 4.5  [employer]IM[employer]ON(ORTH,TRAU,BIO,SG) [id number] Left 1 Implanted  ANCHOR JUGGERKNOT SOFT 1.5 - [id number] Anchor ANCHOR JUGGERKNOT SOFT 1.5  [employer]ON(ORTH,TRAU,BIO,SG) [id number] Left 2 Implanted  IMPL TAPESTRY BIOINTEGR 30X30 - [id number] Orthopedic Implant IMPL TAPESTRY BIOINTEGR 30X30  [employer]ON(ORTH,TRAU,BIO,SG) [id number] Left 1 Implanted    DRAINS: None  CULTURES: None  COMPLICATIONS: none  DESCRIPTION OF PROCEDURE:   The patient was identified in the preoperative holding area.  The correct site was marked and confirmed according to nursing.  The patient was subsequently taken back to the operating room.  Antibiotics were given 1 hour prior to incision.  Anesthesia was induced.  He was placed in the lateral position with a beanbag positioner with care to pad the down leg and peroneal nerve.   Patient was prepped and draped in the usual sterile fashion.  Again timeout was performed confirming the correct side.  An approach was made over the lateral aspect of the greater trochanter.  Dissection  was initially carried down sharply with 15 blade and electrocautery was used to perform hemostasis.  A 15 blade was used to make a nick in the IT band which was completed with Mayo scissors.  At this point we encountered the gluteus medius tendon. There was overlying bursa was dissected out sharply with Metzenbaum scissors and removed. This was extremely thin and then completely torn.  This was debrided and the trochanteric lateral posterior facet was prepared using a Cobb.  We then mobilized the gluteus medius abductor muscles with an Allis clamp.  Excess bursal tissue was excised sharply with Mayo scissors.  A double row type configuration was then utilized with 2 Medial Row all suture anchors (a total of 8 limbs).  These were placed through the tissue and then brought to 2 Lateral Row Quattro anchors.  This produced an anatomic footprint restoration of the gluteus tendon. Given the quality and chronicity of tendon tearing, the decision was made to augment the repair with a collagen Tapestry patch by suturing this into the footprint of the repair in all 4 corners using an 0-vicrol.   The wounds were thoroughly irrigated.  We closed in layers of 0 Vicryl for the IT band, 2-0 Vicryl, and staples for skin. The patient was awoken and taken to the PACU.  All counts were correct at the end of the case and no complications.     POSTOPERATIVE PLAN: She will be weightbearing as tolerated on the left leg.  She will be seen immediately with physical therapy.  See her back in 2 weeks for suture removal.  She was placed on aspirin  for 2 weeks for blood  clot prevention  Carmina Chris, MD 12:58 PM

## 2024-01-05 NOTE — Brief Op Note (Signed)
   Brief Op Note  Date of Surgery: 01/05/2024  Preoperative Diagnosis: LEFT GLUTEUS MEDIUS TEAR  Postoperative Diagnosis: same  Procedure: Procedure(s): REPAIR, TENDON, GLUTEUS MINIMUS  Implants: Implant Name Type Inv. Item Serial No. Manufacturer Lot No. LRB No. Used Action  ANCHOR SUT QUATTRO KNTLS 4.5 - [id number] Anchor ANCHOR SUT QUATTRO KNTLS 4.5  [employer](ORTH,TRAU,BIO,SG) [id number] Left 1 Implanted  ANCHOR SUT QUATTRO KNTLS 4.5 - [id number] Anchor ANCHOR SUT QUATTRO KNTLS 4.5  [employer]ON(ORTH,TRAU,BIO,SG) [id number] Left 1 Implanted  ANCHOR JUGGERKNOT SOFT 1.5 - [id number] Anchor ANCHOR JUGGERKNOT SOFT 1.5  [employer](ORTH,TRAU,BIO,SG) [id number] Left 2 Implanted  IMPL TAPESTRY BIOINTEGR 30X30 - [id number] Orthopedic Implant IMPL TAPESTRY BIOINTEGR 30X30  [employer]MER [employer]ON(ORTH,TRAU,BIO,SG) [id number] Left 1 Implanted    Surgeons: Surgeon(s): Wilhelmenia Harada, MD  Anesthesia: General    Estimated Blood Loss: See anesthesia record  Complications: None  Condition to PACU: Stable  Carmina Chris, MD 01/05/2024 12:58 PM

## 2024-01-05 NOTE — Discharge Instructions (Addendum)
 Discharge Instructions    Attending Surgeon: Wilhelmenia Harada, MD Office Phone Number: [phone]   Diagnosis and Procedures:    Surgeries Performed: Left hip gluteus medius repair  Discharge Plan:    Diet: Resume usual diet. Begin with light or bland foods.  Drink plenty of fluids.  Activity:  Weight bearing as tolerated using crutches for assistance. You are advised to go home directly from the hospital or surgical center. Restrict your activities.  GENERAL INSTRUCTIONS: 1.  Please apply ice to your wound to help with swelling and inflammation. This will improve your comfort and your overall recovery following surgery.     2. Please call Dr. Verline Glow office at [phone] with questions Monday-Friday during business hours. If no one answers, please leave a message and someone should get back to the patient within 24 hours. For emergencies please call 911 or proceed to the emergency room.   3. Patient to notify surgical team if experiences any of the following: Bowel/Bladder dysfunction, uncontrolled pain, nerve/muscle weakness, incision with increased drainage or redness, nausea/vomiting and Fever greater than 101.0 F.  Be alert for signs of infection including redness, streaking, odor, fever or chills. Be alert for excessive pain or bleeding and notify your surgeon immediately.  WOUND INSTRUCTIONS:   Leave your dressing, cast, or splint in place until your post operative visit.  Keep it clean and dry.  Always keep the incision clean and dry until the staples/sutures are removed. If there is no drainage from the incision you should keep it open to air. If there is drainage from the incision you must keep it covered at all times until the drainage stops  Do not soak in a bath tub, hot tub, pool, lake or other body of water until 21 days after your surgery and your incision is completely dry and healed.  If you have removable sutures (or staples) they must be removed  10-14 days (unless otherwise instructed) from the day of your surgery.     1)  Elevate the extremity as much as possible.  2)  Keep the dressing clean and dry.  3)  Please call us  if the dressing becomes wet or dirty.  4)  If you are experiencing worsening pain or worsening swelling, please call.     MEDICATIONS: Resume all previous home medications at the previous prescribed dose and frequency unless otherwise noted Start taking the  pain medications on an as-needed basis as prescribed  Please taper down pain medication over the next week following surgery.  Ideally you should not require a refill of any narcotic pain medication.  Take pain medication with food to minimize nausea. In addition to the prescribed pain medication, you may take over-the-counter pain relievers such as Tylenol .  Do NOT take additional tylenol  if your pain medication already has tylenol  in it.  Aspirin  325mg  daily per instructions on bottle. Narcotic policy: Per [facility] clinic policy, our goal is ensure optimal postoperative pain control with a multimodal pain management strategy. For all [facility] patients, our goal is to wean post-operative narcotic medications by 6 weeks post-operatively, and many times sooner. If this is not possible due to utilization of pain medication prior to surgery, your [facility] doctor will support your acute post-operative pain control for the first 6 weeks postoperatively, with a plan to transition you back to your primary pain team following that. Max Spain will work to ensure a Therapist, occupational.  You take next dose of tylenol  after 5pm.  You may  take ibuprofen  after 8pm.     FOLLOWUP INSTRUCTIONS: 1. Follow up at the Physical Therapy Clinic 3-4 days following surgery. This appointment should be scheduled unless other arrangements have been made.The Physical Therapy scheduling number is [phone] if an appointment has not already been arranged.  2. Contact Dr. Verline Glow office  during office hours at [phone] or the practice after hours line at [phone] for non-emergencies. For medical emergencies call 911.   Discharge Location: Home   Post Anesthesia Home Care Instructions  Activity: Get plenty of rest for the remainder of the day. A responsible individual must stay with you for 24 hours following the procedure.  For the next 24 hours, DO NOT: -Drive a car -Advertising copywriter -Drink alcoholic beverages -Take any medication unless instructed by your physician -Make any legal decisions or sign important papers.  Meals: Start with liquid foods such as gelatin or soup. Progress to regular foods as tolerated. Avoid greasy, spicy, heavy foods. If nausea and/or vomiting occur, drink only clear liquids until the nausea and/or vomiting subsides. Call your physician if vomiting continues.  Special Instructions/Symptoms: Your throat may feel dry or sore from the anesthesia or the breathing tube placed in your throat during surgery. If this causes discomfort, gargle with warm salt water. The discomfort should disappear within 24 hours.  If you had a scopolamine patch placed behind your ear for the management of post- operative nausea and/or vomiting:  1. The medication in the patch is effective for 72 hours, after which it should be removed.  Wrap patch in a tissue and discard in the trash. Wash hands thoroughly with soap and water. 2. You may remove the patch earlier than 72 hours if you experience unpleasant side effects which may include dry mouth, dizziness or visual disturbances. 3. Avoid touching the patch. Wash your hands with soap and water after contact with the patch.

## 2024-01-06 ENCOUNTER — Encounter (HOSPITAL_BASED_OUTPATIENT_CLINIC_OR_DEPARTMENT_OTHER): Payer: Self-pay | Admitting: Orthopaedic Surgery

## 2024-01-06 NOTE — Anesthesia Postprocedure Evaluation (Signed)
 Anesthesia Post Note  Patient: Jennifer Noble  Procedure(s) Performed: REPAIR, TENDON, GLUTEUS MINIMUS (Left: Hip)     Patient location during evaluation: PACU Anesthesia Type: General Level of consciousness: awake and alert Pain management: pain level controlled Vital Signs Assessment: post-procedure vital signs reviewed and stable Respiratory status: spontaneous breathing, nonlabored ventilation, respiratory function stable and patient connected to nasal cannula oxygen Cardiovascular status: blood pressure returned to baseline and stable Postop Assessment: no apparent nausea or vomiting Anesthetic complications: no   No notable events documented.  Last Vitals:  Vitals:   01/05/24 1430 01/05/24 1438  BP:  129/85  Pulse: 86 88  Resp:  15  Temp:  (!) 36.2 C  SpO2:  99%    Last Pain:  Vitals:   01/05/24 1438  TempSrc:   PainSc: 7                  Myan Locatelli

## 2024-01-09 ENCOUNTER — Other Ambulatory Visit: Payer: Self-pay

## 2024-01-09 ENCOUNTER — Ambulatory Visit (HOSPITAL_BASED_OUTPATIENT_CLINIC_OR_DEPARTMENT_OTHER): Payer: Self-pay | Attending: Orthopaedic Surgery | Admitting: Physical Therapy

## 2024-01-09 ENCOUNTER — Encounter (HOSPITAL_BASED_OUTPATIENT_CLINIC_OR_DEPARTMENT_OTHER): Payer: Self-pay | Admitting: Physical Therapy

## 2024-01-09 DIAGNOSIS — S76012D Strain of muscle, fascia and tendon of left hip, subsequent encounter: Secondary | ICD-10-CM | POA: Insufficient documentation

## 2024-01-09 DIAGNOSIS — M6281 Muscle weakness (generalized): Secondary | ICD-10-CM | POA: Insufficient documentation

## 2024-01-09 DIAGNOSIS — M25552 Pain in left hip: Secondary | ICD-10-CM | POA: Insufficient documentation

## 2024-01-09 DIAGNOSIS — M7062 Trochanteric bursitis, left hip: Secondary | ICD-10-CM | POA: Insufficient documentation

## 2024-01-09 NOTE — Therapy (Signed)
 OUTPATIENT PHYSICAL THERAPY LOWER EXTREMITY EVALUATION   Patient Name: Jennifer Noble MRN: [id number] DOB:March 06, 1976, 48 y.o., female Today's Date: 01/09/2024  END OF SESSION:  PT End of Session - 01/09/24 1519     Visit Number 1    Number of Visits 16    Date for PT Re-Evaluation 03/05/24    Authorization Type CAFA    PT Start Time 0803    PT Stop Time 0845    PT Time Calculation (min) 42 min    Activity Tolerance Patient tolerated treatment well    Behavior During Therapy [facility] for tasks assessed/performed             Past Medical History:  Diagnosis Date   Anxiety    Depression    Dysuria    Hemorrhoid    High cholesterol    Past Surgical History:  Procedure Laterality Date   GLUTEUS MINIMUS REPAIR Left 01/05/2024   Procedure: REPAIR, TENDON, GLUTEUS MINIMUS;  Surgeon: Wilhelmenia Harada, MD;  Location: [facility];  Service: Orthopedics;  Laterality: Left;  LEFT GLUTEUS MEDIUS REPAIR AND POSSIBLE COLLAGEN PATCH AUGMENTATION   HEMORRHOID SURGERY     Patient Active Problem List   Diagnosis Date Noted   Tear of left gluteus minimus tendon 01/05/2024   Vasomotor symptoms due to menopause 12/03/2022   Headache 05/07/2016   Depression 08/17/2015   Insomnia 08/17/2015   Elevated lipids 04/08/2013   Family history of early CAD 04/08/2013   Hemorrhoid    Dysuria     PCP: Natale Bail NP  REFERRING PROVIDER: DR Heloise Lobo   REFERRING DIAG:  Left glut repair   THERAPY DIAG:  Pain in left hip  Muscle weakness (generalized)  Trochanteric bursitis of left hip  Rationale for Evaluation and Treatment: Rehabilitation  ONSET DATE: DOS 01/05/2024  SUBJECTIVE:   SUBJECTIVE STATEMENT: Patient has a long history of left lateral hip and low back pain.  She was found to have a glute med tear.  On 01/05/2024 she had a glute med repair as well as a left trochanteric bursectomy.  At this time she is having pain when she is standing and  walking.  She is using 2 crutches.  PERTINENT HISTORY: Anxiety, depression  PAIN:  Are you having pain? Yes: NPRS scale: 8/10 right now  Pain location: left hip Pain description: aching  Aggravating factors: Standing/ walking/ night time  Relieving factors: pain medication   PRECAUTIONS: None  RED FLAGS: None   WEIGHT BEARING RESTRICTIONS: Yes WBAT   FALLS:  Has patient fallen in last 6 months? 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  DIAGNOSTIC FINDINGS:  Nothing post op   PATIENT SURVEYS:  LEFS  Extreme difficulty/unable (0), Quite a bit of difficulty (1), Moderate difficulty (2), Little difficulty (3), No difficulty (4) Survey date:    Any of your usual work, housework or school activities   2. Usual hobbies, recreational or sporting activities   3. Getting into/out of the bath   4. Walking between rooms   5. Putting on socks/shoes   6. Squatting    7. Lifting an object, like a bag of groceries from the floor   8. Performing  light activities around your home   9. Performing heavy activities around your home   10. Getting into/out of a car   11. Walking 2 blocks   12. Walking 1 mile   13. Going up/down 10 stairs (1 flight)   14. Standing for 1 hour   15.  sitting for 1 hour   16. Running on even ground   17. Running on uneven ground   18. Making sharp turns while running fast   19. Hopping    20. Rolling over in bed   Score total:  1/80     COGNITION: Overall cognitive status: Within functional limits for tasks assessed     SENSATION: WFL  EDEMA:  No noticeable edema  POSTURE: No Significant postural limitations  PALPATION: No unexpected tenderness to palpation   LOWER  EXTREMITY ROM:  Passive ROM Right eval Left eval  Hip flexion  78  Hip extension    Hip abduction    Hip adduction    Hip internal rotation  5  Hip external rotation  Not performed 2nd to protocol   Knee flexion    Knee extension    Ankle dorsiflexion    Ankle plantarflexion    Ankle inversion    Ankle eversion     (Blank rows = not tested)  LOWER EXTREMITY MMT:  MMT Right eval Left eval  Hip flexion    Hip extension    Hip abduction    Hip adduction    Hip internal rotation    Hip external rotation    Knee flexion    Knee extension    Ankle dorsiflexion    Ankle plantarflexion    Ankle inversion    Ankle eversion     (Blank rows = not tested) Not tested 2nd to recent surgery    FUNCTIONAL TESTS:  Using hands to stand form a surface   GAIT: Using 2 crutches. Limited weight bearing at this time                                                                                                                               TREATMENT DATE:   Manual: gentle PROM per protocol   There-ex:  Ankle pumps x15 Heel slide with strap  5x Quad sets x5     PATIENT EDUCATION:  Education details: HEP, symptom management  Person educated: Patient Education method: Explanation, Demonstration, Tactile cues, Verbal cues, and Handouts Education comprehension: verbalized understanding, returned demonstration, verbal cues required, tactile cues required, and needs further education  HOME EXERCISE PROGRAM: Access Code: [email or web address] URL: [email or web address] Date: 01/09/2024 Prepared by: Signa Drier  Exercises - Supine Heel Slide with Strap  - 3 x daily - 7 x weekly - 3 sets - 5 reps - 5 hold - Supine Quad Set  - 4-5 x daily - 7 x weekly - 3 sets - 10 reps - Seated Ankle Pumps  - 1 x daily - 6 x  weekly - 3 sets - 10 reps - Supine Transversus Abdominis Bracing - Hands on Stomach  - 3-4 x daily - 7 x weekly - 3 sets - 10 reps  ASSESSMENT:  CLINICAL  IMPRESSION: Patient is a 48 year old female status post glute med repair and left trochanteric bursa bursectomy.  She presents with expected limitations in motion, strength, and general functional mobility.  She is currently walking with 2 crutches.  She is having pain but it is improving.  She would benefit from skilled therapy to improve her ability to ambulate and perform daily tasks.  OBJECTIVE IMPAIRMENTS: Abnormal gait, decreased activity tolerance, decreased endurance, decreased mobility, difficulty walking, decreased ROM, decreased strength, increased edema, and pain.   ACTIVITY LIMITATIONS: carrying, lifting, bending, sitting, standing, squatting, sleeping, stairs, transfers, bed mobility, bathing, hygiene/grooming, and locomotion level  PARTICIPATION LIMITATIONS: meal prep, cleaning, laundry, driving, shopping, community activity, and occupation  PERSONAL FACTORS: 1-2 comorbidities: anxiety are also affecting patient's functional outcome.   REHAB POTENTIAL: Good  CLINICAL DECISION MAKING: Evolving/moderate complexity Pain limiting general function   EVALUATION COMPLEXITY: Moderate   GOALS: Goals reviewed with patient? Yes  SHORT TERM GOALS: Target date: 02/06/2024   Patient will get in and out of bed independently without increased pain Baseline: Goal status: INITIAL  2.  Patient will progress to ambulation without crutches as tolerated Baseline:  Goal status: INITIAL  3.  Patient will demonstrate good glued contraction without increased pain Baseline:  Goal status: INITIAL  4.  Patient will increase passive range of motion on the left into flexion to 90 degrees Baseline:  Goal status: INITIAL  5.  Patient will be independent with basic HEP Baseline:  Goal status: INITIAL   LONG TERM GOALS: Target date: 03/05/2024    Patient will go up and down 2 steps with reciprocal gait pattern in order to get in and out of her house Baseline:  Goal status: INITIAL  2.   Patient will ambulate community distances without increased pain Baseline:  Goal status: INITIAL  3.  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  Patient will have complete exercise program to promote further strengthening and general functional mobility Baseline:  Goal status: INITIAL L   PLAN:  PT FREQUENCY: 2x/week  PT DURATION: 8 weeks  PLANNED INTERVENTIONS: 97110-Therapeutic exercises, 97530- Therapeutic activity, [id number]- Neuromuscular re-education, 97535- Self Care, 16109- Manual therapy, [id number]- Gait training, [phone]- Aquatic Therapy, 97014- Electrical stimulation (unattended), 97035- Ultrasound, Patient/Family education, Stair training, Taping, Dry Needling, DME instructions, Cryotherapy, and Moist heat   PLAN FOR NEXT SESSION: Continue with passive range of motion.  Follow glutes [place] repair protocol.  Progress gait training as tolerated.   Kitty Perkins, PT 01/09/2024, 3:21 PM

## 2024-01-15 ENCOUNTER — Ambulatory Visit (HOSPITAL_BASED_OUTPATIENT_CLINIC_OR_DEPARTMENT_OTHER): Payer: Self-pay

## 2024-01-15 ENCOUNTER — Encounter (HOSPITAL_BASED_OUTPATIENT_CLINIC_OR_DEPARTMENT_OTHER): Payer: Self-pay

## 2024-01-15 DIAGNOSIS — M7062 Trochanteric bursitis, left hip: Secondary | ICD-10-CM

## 2024-01-15 DIAGNOSIS — M6281 Muscle weakness (generalized): Secondary | ICD-10-CM

## 2024-01-15 DIAGNOSIS — M25552 Pain in left hip: Secondary | ICD-10-CM

## 2024-01-15 NOTE — Therapy (Signed)
 OUTPATIENT PHYSICAL THERAPY LOWER EXTREMITY treatment   Patient Name: Jennifer Noble MRN: [id number] DOB:November 15, 1975, 48 y.o., female, female Today's Date: 01/15/2024  END OF SESSION:  PT End of Session - 01/15/24 1311     Visit Number 2    Number of Visits 16    Date for PT Re-Evaluation 03/05/24    Authorization Type CAFA    PT Start Time 1305    PT Stop Time 1345    PT Time Calculation (min) 40 min    Activity Tolerance Patient tolerated treatment well    Behavior During Therapy WFL for tasks assessed/performed              Past Medical History:  Diagnosis Date   Anxiety    Depression    Dysuria    Hemorrhoid    High cholesterol    Past Surgical History:  Procedure Laterality Date   GLUTEUS MINIMUS REPAIR Left 01/05/2024   Procedure: REPAIR, TENDON, GLUTEUS MINIMUS;  Surgeon: Wilhelmenia Harada, MD;  Location: [facility];  Service: Orthopedics;  Laterality: Left;  LEFT GLUTEUS MEDIUS REPAIR AND POSSIBLE COLLAGEN PATCH AUGMENTATION   HEMORRHOID SURGERY     Patient Active Problem List   Diagnosis Date Noted   Tear of left gluteus minimus tendon 01/05/2024   Vasomotor symptoms due to menopause 12/03/2022   Headache 05/07/2016   Depression 08/17/2015   Insomnia 08/17/2015   Elevated lipids 04/08/2013   Family history of early CAD 04/08/2013   Hemorrhoid    Dysuria     PCP: Natale Bail NP  REFERRING PROVIDER: DR Heloise Lobo   REFERRING DIAG:  Left glut repair   THERAPY DIAG:  Pain in left hip  Trochanteric bursitis of left hip  Muscle weakness (generalized)  Rationale for Evaluation and Treatment: Rehabilitation  ONSET DATE: DOS 01/05/2024  SUBJECTIVE:   SUBJECTIVE STATEMENT:  Interpretor present. Pt arrives with bilateral crutches. Reports hip is doing much better. Tries to use single crutch at home but unsure which side to use on. Has f/u tomorrow with Hermina Loosen.   Eval: Patient has a long history of left lateral hip  and low back pain.  She was found to have a glute med tear.  On 01/05/2024 she had a glute med repair as well as a left trochanteric bursectomy.  At this time she is having pain when she is standing and walking.  She is using 2 crutches.  PERTINENT HISTORY: Anxiety, depression  PAIN:  Are you having pain? Yes: NPRS scale: 0/10 right now  Pain location: left hip Pain description: aching  Aggravating factors: Standing/ walking/ night time  Relieving factors: pain medication   PRECAUTIONS: None  RED FLAGS: None   WEIGHT BEARING RESTRICTIONS: Yes WBAT   FALLS:  Has patient fallen in last 6 months? 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  DIAGNOSTIC FINDINGS:  Nothing post op   PATIENT SURVEYS:  LEFS  Extreme difficulty/unable (0), Quite a bit of difficulty (1), Moderate difficulty (2), Little difficulty (3), No difficulty (4) Survey date:    Any of your usual work, housework or school activities   2. Usual hobbies, recreational or sporting activities   3. Getting into/out of  the bath   4. Walking between rooms   5. Putting on socks/shoes   6. Squatting    7. Lifting an object, like a bag of groceries from the floor   8. Performing light activities around your home   9. Performing heavy activities around your home   10. Getting into/out of a car   11. Walking 2 blocks   12. Walking 1 mile   13. Going up/down 10 stairs (1 flight)   14. Standing for 1 hour   15.  sitting for 1 hour   16. Running on even ground   17. Running on uneven ground   18. Making sharp turns while running fast   19. Hopping    20. Rolling over in bed   Score total:  1/80     COGNITION: Overall cognitive status: Within  functional limits for tasks assessed     SENSATION: WFL  EDEMA:  No noticeable edema  POSTURE: No Significant postural limitations  PALPATION: No unexpected tenderness to palpation   LOWER EXTREMITY ROM:  Passive ROM Right eval Left eval  Hip flexion  78  Hip extension    Hip abduction    Hip adduction    Hip internal rotation  5  Hip external rotation  Not performed 2nd to protocol   Knee flexion    Knee extension    Ankle dorsiflexion    Ankle plantarflexion    Ankle inversion    Ankle eversion     (Blank rows = not tested)  LOWER EXTREMITY MMT:  MMT Right eval Left eval  Hip flexion    Hip extension    Hip abduction    Hip adduction    Hip internal rotation    Hip external rotation    Knee flexion    Knee extension    Ankle dorsiflexion    Ankle plantarflexion    Ankle inversion    Ankle eversion     (Blank rows = not tested) Not tested 2nd to recent surgery    FUNCTIONAL TESTS:  Using hands to stand form a surface   GAIT: Using 2 crutches. Limited weight bearing at this time                                                                                                                               TREATMENT DATE:   There-ex:  Gentle PROM per protocol Glute sets 5" x20 Quad sets 5" x20 PPT 5" 2x10 Quadruped anterior/posterior rocking x 10 Seated LAQ x 15 Gait in hall with single crutch x 75 feet    PATIENT EDUCATION:  Education details: HEP, symptom management  Person educated: Patient Education method: Explanation, Demonstration, Tactile cues, Verbal cues, and Handouts Education comprehension: verbalized understanding, returned demonstration, verbal cues required, tactile cues required, and needs further education  HOME EXERCISE PROGRAM: Access Code: [email or web address] URL: [email or web address] Date: 01/09/2024 Prepared by: Signa Drier  Exercises - Supine Heel Slide with  Strap  - 3 x daily - 7 x weekly - 3 sets - 5 reps  - 5 hold - Supine Quad Set  - 4-5 x daily - 7 x weekly - 3 sets - 10 reps - Seated Ankle Pumps  - 1 x daily - 6 x weekly - 3 sets - 10 reps - Supine Transversus Abdominis Bracing - Hands on Stomach  - 3-4 x daily - 7 x weekly - 3 sets - 10 reps  ASSESSMENT:  CLINICAL IMPRESSION: Patient with good tolerance for gentle PROM within protocol allowance.  Did well with gentle isometrics and TherEX today.  She demonstrates mild discomfort with transitional movements.  Provided education on avoiding internal and external rotation based movements at this time.  Reviewed gait with single crutch to ensure proper performance.  Patient required minimal correction with this and ambulates well with correct pattern and sequencing.  Patient to have follow-up with surgeon tomorrow for bandage removal.  Eval: Patient is a 48 year old female status post glute med repair and left trochanteric bursa bursectomy.  She presents with expected limitations in motion, strength, and general functional mobility.  She is currently walking with 2 crutches.  She is having pain but it is improving.  She would benefit from skilled therapy to improve her ability to ambulate and perform daily tasks.  OBJECTIVE IMPAIRMENTS: Abnormal gait, decreased activity tolerance, decreased endurance, decreased mobility, difficulty walking, decreased ROM, decreased strength, increased edema, and pain.   ACTIVITY LIMITATIONS: carrying, lifting, bending, sitting, standing, squatting, sleeping, stairs, transfers, bed mobility, bathing, hygiene/grooming, and locomotion level  PARTICIPATION LIMITATIONS: meal prep, cleaning, laundry, driving, shopping, community activity, and occupation  PERSONAL FACTORS: 1-2 comorbidities: anxiety are also affecting patient's functional outcome.   REHAB POTENTIAL: Good  CLINICAL DECISION MAKING: Evolving/moderate complexity Pain limiting general function   EVALUATION COMPLEXITY: Moderate   GOALS: Goals  reviewed with patient? Yes  SHORT TERM GOALS: Target date: 02/06/2024   Patient will get in and out of bed independently without increased pain Baseline: Goal status: INITIAL  2.  Patient will progress to ambulation without crutches as tolerated Baseline:  Goal status: INITIAL  3.  Patient will demonstrate good glued contraction without increased pain Baseline:  Goal status: INITIAL  4.  Patient will increase passive range of motion on the left into flexion to 90 degrees Baseline:  Goal status: INITIAL  5.  Patient will be independent with basic HEP Baseline:  Goal status: INITIAL   LONG TERM GOALS: Target date: 03/05/2024    Patient will go up and down 2 steps with reciprocal gait pattern in order to get in and out of her house Baseline:  Goal status: INITIAL  2.  Patient will ambulate community distances without increased pain Baseline:  Goal status: INITIAL  3.  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  Patient will have complete exercise program to promote further strengthening and general functional mobility Baseline:  Goal status: INITIAL L   PLAN:  PT FREQUENCY: 2x/week  PT DURATION: 8 weeks  PLANNED INTERVENTIONS: 97110-Therapeutic exercises, 97530- Therapeutic activity, [id number]- Neuromuscular re-education, 97535- Self Care, 21308- Manual therapy, [id number]- Gait training, [phone]- Aquatic Therapy, 97014- Electrical stimulation (unattended), 97035- Ultrasound, Patient/Family education, Stair training, Taping, Dry Needling, DME instructions, Cryotherapy, and Moist heat   PLAN FOR NEXT SESSION: Continue with passive range of motion.  Follow glutes Meade repair protocol.  Progress gait  training as tolerated.   Fronie Jewett Antionette Luster, PTA 01/15/2024, 5:05 PM

## 2024-01-16 ENCOUNTER — Ambulatory Visit (HOSPITAL_BASED_OUTPATIENT_CLINIC_OR_DEPARTMENT_OTHER): Payer: Self-pay | Admitting: Orthopaedic Surgery

## 2024-01-16 DIAGNOSIS — S76012D Strain of muscle, fascia and tendon of left hip, subsequent encounter: Secondary | ICD-10-CM

## 2024-01-16 NOTE — Progress Notes (Signed)
 Post Operative Evaluation    Procedure/Date of Surgery: Left hip gluteus medius repair 5/19  Interval History:   Presents 2 weeks status post above procedure.  Overall she is doing very well.  She is just walking with 1 crutch now.  She is overall having minimal pain.  She is overall doing quite well   PMH/PSH/Family History/Social History/Meds/Allergies:    Past Medical History:  Diagnosis Date   Anxiety    Depression    Dysuria    Hemorrhoid    High cholesterol    Past Surgical History:  Procedure Laterality Date   GLUTEUS MINIMUS REPAIR Left 01/05/2024   Procedure: REPAIR, TENDON, GLUTEUS MINIMUS;  Surgeon: Wilhelmenia Harada, MD;  Location: [facility];  Service: Orthopedics;  Laterality: Left;  LEFT GLUTEUS MEDIUS REPAIR AND POSSIBLE COLLAGEN PATCH AUGMENTATION   HEMORRHOID SURGERY     Social History   Socioeconomic History   Marital status: Married    Spouse name: Not on file   Number of children: 3   Years of education: Not on file   Highest education level: 6th grade  Occupational History   Not on file  Tobacco Use   Smoking status: Never   Smokeless tobacco: Never  Vaping Use   Vaping status: Never Used  Substance and Sexual Activity   Alcohol use: No   Drug use: No   Sexual activity: Yes    Birth control/protection: Post-menopausal  Other Topics Concern   Not on file  Social History Narrative   Not on file   Social Drivers of Health   Financial Resource Strain: Not on file  Food Insecurity: Food Insecurity Present (12/03/2022)   Hunger Vital Sign    Worried About Running Out of Food in the Last Year: Sometimes true    Ran Out of Food in the Last Year: Sometimes true  Transportation Needs: No Transportation Needs (12/03/2022)   PRAPARE - Administrator, Civil Service (Medical): No    Lack of Transportation (Non-Medical): No  Physical Activity: Not on file  Stress: Not on file  Social  Connections: Not on file   Family History  Problem Relation Age of Onset   Diabetes Mother    Diabetes Sister    Diabetes Brother    Heart disease Other    Breast cancer Neg Hx    Allergies  Allergen Reactions   Naproxen  Nausea And Vomiting   Current Outpatient Medications  Medication Sig Dispense Refill   aspirin  EC 325 MG tablet Take 1 tablet (325 mg total) by mouth daily. 14 tablet 0   citalopram  (CELEXA ) 20 MG tablet Take 1 tablet (20 mg total) by mouth daily. 90 tablet 3   gabapentin  (NEURONTIN ) 300 MG capsule Take 1 capsule (300 mg total) by mouth 2 (two) times daily. (Patient not taking: Reported on 01/09/2024) 60 capsule 1   nitroGLYCERIN  (NITRODUR - DOSED IN MG/24 HR) 0.2 mg/hr patch Cut patch into fourths. Place 1/4 patch over affected area on hip. Change every 24 hours. (Patient not taking: Reported on 12/29/2023) 30 patch 0   oxyCODONE  (ROXICODONE ) 5 MG immediate release tablet Take 1 tablet (5 mg total) by mouth every 4 (four) hours as needed for severe pain (pain score 7-10) or breakthrough pain. 15 tablet 0   No current facility-administered medications for  this visit.   No results found.  Review of Systems:   A ROS was performed including pertinent positives and negatives as documented in the HPI.   Musculoskeletal Exam:    Left hip with little to no pain.  She has a well-appearing incision without erythema or drainage.  Distal neurosensory exam is intact.  Abduction strength deferred today internal/external rotation of the left hip is to 30 degrees  Imaging:      I personally reviewed and interpreted the radiographs.   Assessment:   2 weeks status post left hip gluteus medius tendon repair overall doing treatment well.  This time should continue to advance weightbearing as tolerated.  I will plan to see her back in 4 weeks for reassessment  Plan :    - Return to clinic 4 weeks for reassessment      I personally saw and evaluated the patient, and  participated in the management and treatment plan.  Wilhelmenia Harada, MD Attending Physician, Orthopedic Surgery  This document was dictated using Dragon voice recognition software. A reasonable attempt at proof reading has been made to minimize errors.

## 2024-01-22 ENCOUNTER — Ambulatory Visit (HOSPITAL_BASED_OUTPATIENT_CLINIC_OR_DEPARTMENT_OTHER): Payer: Self-pay | Attending: Orthopaedic Surgery

## 2024-01-22 ENCOUNTER — Encounter (HOSPITAL_BASED_OUTPATIENT_CLINIC_OR_DEPARTMENT_OTHER): Payer: Self-pay

## 2024-01-22 DIAGNOSIS — M25552 Pain in left hip: Secondary | ICD-10-CM | POA: Insufficient documentation

## 2024-01-22 DIAGNOSIS — M6281 Muscle weakness (generalized): Secondary | ICD-10-CM | POA: Insufficient documentation

## 2024-01-22 DIAGNOSIS — M7062 Trochanteric bursitis, left hip: Secondary | ICD-10-CM | POA: Insufficient documentation

## 2024-01-22 NOTE — Therapy (Signed)
 OUTPATIENT PHYSICAL THERAPY LOWER EXTREMITY treatment   Patient Name: Libbie Zenya Hickam MRN: [id number] DOB:[birth year], 48 y.o., female Today's Date: 01/22/2024  END OF SESSION:  PT End of Session - 01/22/24 0926     Visit Number 3    Number of Visits 16    Date for PT Re-Evaluation 03/05/24    Authorization Type CAFA    PT Start Time 0936    PT Stop Time 1015    PT Time Calculation (min) 39 min    Activity Tolerance Patient tolerated treatment well    Behavior During Therapy [facility] for tasks assessed/performed               Past Medical History:  Diagnosis Date   Anxiety    Depression    Dysuria    Hemorrhoid    High cholesterol    Past Surgical History:  Procedure Laterality Date   GLUTEUS MINIMUS REPAIR Left 01/05/2024   Procedure: REPAIR, TENDON, GLUTEUS MINIMUS;  Surgeon: Wilhelmenia Harada, MD;  Location: [facility];  Service: Orthopedics;  Laterality: Left;  LEFT GLUTEUS MEDIUS REPAIR AND POSSIBLE COLLAGEN PATCH AUGMENTATION   HEMORRHOID SURGERY     Patient Active Problem List   Diagnosis Date Noted   Tear of left gluteus minimus tendon 01/05/2024   Vasomotor symptoms due to menopause 12/03/2022   Headache 05/07/2016   Depression 08/17/2015   Insomnia 08/17/2015   Elevated lipids 04/08/2013   Family history of early CAD 04/08/2013   Hemorrhoid    Dysuria     PCP: Natale Bail NP  REFERRING PROVIDER: DR Heloise Lobo   REFERRING DIAG:  Left glut repair   THERAPY DIAG:  Pain in left hip  Trochanteric bursitis of left hip  Muscle weakness (generalized)  Rationale for Evaluation and Treatment: Rehabilitation  ONSET DATE: DOS 01/05/2024  SUBJECTIVE:   SUBJECTIVE STATEMENT:  Interpretor present. Pt arrives with single crutch. MD appointment went well. Pain is slightly less.    Eval: Patient has a long history of left lateral hip and low back pain.  She was found to have a glute med tear.  On 01/05/2024 she had a glute  med repair as well as a left trochanteric bursectomy.  At this time she is having pain when she is standing and walking.  She is using 2 crutches.  PERTINENT HISTORY: Anxiety, depression  PAIN:  Are you having pain? Yes: NPRS scale: 0/10 right now  Pain location: left hip Pain description: aching  Aggravating factors: Standing/ walking/ night time  Relieving factors: pain medication   PRECAUTIONS: None  RED FLAGS: None   WEIGHT BEARING RESTRICTIONS: Yes WBAT   FALLS:  Has patient fallen in last 6 months? 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  DIAGNOSTIC FINDINGS:  Nothing post op   PATIENT SURVEYS:  LEFS  Extreme difficulty/unable (0), Quite a bit of difficulty (1), Moderate difficulty (2), Little difficulty (3), No difficulty (4) Survey date:    Any of your usual work, housework or school activities   2. Usual hobbies, recreational or sporting activities   3. Getting into/out of the bath   4. Walking between rooms   5. Putting on socks/shoes  6. Squatting    7. Lifting an object, like a bag of groceries from the floor   8. Performing light activities around your home   9. Performing heavy activities around your home   10. Getting into/out of a car   11. Walking 2 blocks   12. Walking 1 mile   13. Going up/down 10 stairs (1 flight)   14. Standing for 1 hour   15.  sitting for 1 hour   16. Running on even ground   17. Running on uneven ground   18. Making sharp turns while running fast   19. Hopping    20. Rolling over in bed   Score total:  1/80     COGNITION: Overall cognitive status: Within functional limits for tasks assessed     SENSATION: WFL  EDEMA:  No noticeable  edema  POSTURE: No Significant postural limitations  PALPATION: No unexpected tenderness to palpation   LOWER EXTREMITY ROM:  Passive ROM Right eval Left eval  Hip flexion  78  Hip extension    Hip abduction    Hip adduction    Hip internal rotation  5  Hip external rotation  Not performed 2nd to protocol   Knee flexion    Knee extension    Ankle dorsiflexion    Ankle plantarflexion    Ankle inversion    Ankle eversion     (Blank rows = not tested)  LOWER EXTREMITY MMT:  MMT Right eval Left eval  Hip flexion    Hip extension    Hip abduction    Hip adduction    Hip internal rotation    Hip external rotation    Knee flexion    Knee extension    Ankle dorsiflexion    Ankle plantarflexion    Ankle inversion    Ankle eversion     (Blank rows = not tested) Not tested 2nd to recent surgery    FUNCTIONAL TESTS:  Using hands to stand form a surface   GAIT: Using 2 crutches. Limited weight bearing at this time                                                                                                                               TREATMENT DATE:   There-ex:  Gentle PROM per protocol Glute sets 5" x20 PPT with adductor sqz 5" 2x10 Quadruped anterior/posterior rocking x 15 Seated LAQ 2.5# 2x10 Seated HSC RTB 2x10 Gait in hall with SPC x 75 feet    PATIENT EDUCATION:  Education details: HEP, symptom management  Person educated: Patient Education method: Explanation, Demonstration, Tactile cues, Verbal cues, and Handouts Education comprehension: verbalized understanding, returned demonstration, verbal cues required, tactile cues required, and needs further education  HOME EXERCISE PROGRAM: Access Code: [email or web address] URL: [email or web address] Date: 01/09/2024 Prepared by: Signa Drier  Exercises - Supine Heel Slide with Strap  - 3 x daily - 7 x weekly - 3 sets -  5 reps - 5 hold - Supine Quad Set  - 4-5 x daily - 7 x weekly - 3 sets - 10  reps - Seated Ankle Pumps  - 1 x daily - 6 x weekly - 3 sets - 10 reps - Supine Transversus Abdominis Bracing - Hands on Stomach  - 3-4 x daily - 7 x weekly - 3 sets - 10 reps  ASSESSMENT:  CLINICAL IMPRESSION: Pt is 2.5 weeks s/p at this time. Practiced with [facility] today as pt stated she would prefer this to crutch. Pt perofrmed this well without instability. Pt can begin using SPC inside her home and and can use crutch when ambulating longer distances. Pt without increase in pain during session. Reminded pt of precautions for this phase of healing. Will continue to progress as tolerated.   Eval: Patient is a 48 year old female status post glute med repair and left trochanteric bursa bursectomy.  She presents with expected limitations in motion, strength, and general functional mobility.  She is currently walking with 2 crutches.  She is having pain but it is improving.  She would benefit from skilled therapy to improve her ability to ambulate and perform daily tasks.  OBJECTIVE IMPAIRMENTS: Abnormal gait, decreased activity tolerance, decreased endurance, decreased mobility, difficulty walking, decreased ROM, decreased strength, increased edema, and pain.   ACTIVITY LIMITATIONS: carrying, lifting, bending, sitting, standing, squatting, sleeping, stairs, transfers, bed mobility, bathing, hygiene/grooming, and locomotion level  PARTICIPATION LIMITATIONS: meal prep, cleaning, laundry, driving, shopping, community activity, and occupation  PERSONAL FACTORS: 1-2 comorbidities: anxiety are also affecting patient's functional outcome.   REHAB POTENTIAL: Good  CLINICAL DECISION MAKING: Evolving/moderate complexity Pain limiting general function   EVALUATION COMPLEXITY: Moderate   GOALS: Goals reviewed with patient? Yes  SHORT TERM GOALS: Target date: 02/06/2024   Patient will get in and out of bed independently without increased pain Baseline: Goal status: INITIAL  2.  Patient will  progress to ambulation without crutches as tolerated Baseline:  Goal status: INITIAL  3.  Patient will demonstrate good glued contraction without increased pain Baseline:  Goal status: INITIAL  4.  Patient will increase passive range of motion on the left into flexion to 90 degrees Baseline:  Goal status: INITIAL  5.  Patient will be independent with basic HEP Baseline:  Goal status: INITIAL   LONG TERM GOALS: Target date: 03/05/2024    Patient will go up and down 2 steps with reciprocal gait pattern in order to get in and out of her house Baseline:  Goal status: INITIAL  2.  Patient will ambulate community distances without increased pain Baseline:  Goal status: INITIAL  3.  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  Patient will have complete exercise program to promote further strengthening and general functional mobility Baseline:  Goal status: INITIAL L   PLAN:  PT FREQUENCY: 2x/week  PT DURATION: 8 weeks  PLANNED INTERVENTIONS: 97110-Therapeutic exercises, 97530- Therapeutic activity, [id number]- Neuromuscular re-education, 97535- Self Care, 16109- Manual therapy, [id number]- Gait training, [phone]- Aquatic Therapy, 97014- Electrical stimulation (unattended), 97035- Ultrasound, Patient/Family education, Stair training, Taping, Dry Needling, DME instructions, Cryotherapy, and Moist heat   PLAN FOR NEXT SESSION: Continue with passive range of motion.  Follow glutes Meade repair protocol.  Progress gait training as tolerated.   Fronie Jewett Corianne Buccellato, PTA 01/22/2024, 1:27 PM

## 2024-01-28 ENCOUNTER — Ambulatory Visit (HOSPITAL_BASED_OUTPATIENT_CLINIC_OR_DEPARTMENT_OTHER): Payer: Self-pay

## 2024-01-28 ENCOUNTER — Encounter (HOSPITAL_BASED_OUTPATIENT_CLINIC_OR_DEPARTMENT_OTHER): Payer: Self-pay

## 2024-01-28 DIAGNOSIS — M6281 Muscle weakness (generalized): Secondary | ICD-10-CM

## 2024-01-28 DIAGNOSIS — M25552 Pain in left hip: Secondary | ICD-10-CM

## 2024-01-28 DIAGNOSIS — M7062 Trochanteric bursitis, left hip: Secondary | ICD-10-CM

## 2024-01-28 NOTE — Therapy (Signed)
 OUTPATIENT PHYSICAL THERAPY LOWER EXTREMITY treatment   Patient Name: Jennifer Noble MRN: [id number] DOB:08-Jan-1976, 48 y.o., female Today's Date: 01/28/2024  END OF SESSION:  PT End of Session - 01/28/24 1146     Visit Number 4    Number of Visits 16    Date for PT Re-Evaluation 03/05/24    Authorization Type CAFA    PT Start Time 1145    PT Stop Time 1228    PT Time Calculation (min) 43 min    Activity Tolerance Patient tolerated treatment well    Behavior During Therapy WFL for tasks assessed/performed                Past Medical History:  Diagnosis Date   Anxiety    Depression    Dysuria    Hemorrhoid    High cholesterol    Past Surgical History:  Procedure Laterality Date   GLUTEUS MINIMUS REPAIR Left 01/05/2024   Procedure: REPAIR, TENDON, GLUTEUS MINIMUS;  Surgeon: Wilhelmenia Harada, MD;  Location: [facility];  Service: Orthopedics;  Laterality: Left;  LEFT GLUTEUS MEDIUS REPAIR AND POSSIBLE COLLAGEN PATCH AUGMENTATION   HEMORRHOID SURGERY     Patient Active Problem List   Diagnosis Date Noted   Tear of left gluteus minimus tendon 01/05/2024   Vasomotor symptoms due to menopause 12/03/2022   Headache 05/07/2016   Depression 08/17/2015   Insomnia 08/17/2015   Elevated lipids 04/08/2013   Family history of early CAD 04/08/2013   Hemorrhoid    Dysuria     PCP: Natale Bail NP  REFERRING PROVIDER: DR Heloise Lobo   REFERRING DIAG:  Left glut repair   THERAPY DIAG:  Pain in left hip  Trochanteric bursitis of left hip  Muscle weakness (generalized)  Rationale for Evaluation and Treatment: Rehabilitation  ONSET DATE: DOS 01/05/2024  SUBJECTIVE:   SUBJECTIVE STATEMENT:  Interpretor present. Pt arrives with cane. Reports very little pain.   Eval: Patient has a long history of left lateral hip and low back pain.  She was found to have a glute med tear.  On 01/05/2024 she had a glute med repair as well as a left  trochanteric bursectomy.  At this time she is having pain when she is standing and walking.  She is using 2 crutches.  PERTINENT HISTORY: Anxiety, depression  PAIN:  Are you having pain? Yes: NPRS scale: 0/10 right now  Pain location: left hip Pain description: aching  Aggravating factors: Standing/ walking/ night time  Relieving factors: pain medication   PRECAUTIONS: None  RED FLAGS: None   WEIGHT BEARING RESTRICTIONS: Yes WBAT   FALLS:  Has patient fallen in last 6 months? 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  DIAGNOSTIC FINDINGS:  Nothing post op   PATIENT SURVEYS:  LEFS  Extreme difficulty/unable (0), Quite a bit of difficulty (1), Moderate difficulty (2), Little difficulty (3), No difficulty (4) Survey date:    Any of your usual work, housework or school activities   2. Usual hobbies, recreational or sporting activities   3. Getting into/out of the bath   4. Walking between rooms   5. Putting on socks/shoes   6. Squatting  7. Lifting an object, like a bag of groceries from the floor   8. Performing light activities around your home   9. Performing heavy activities around your home   10. Getting into/out of a car   11. Walking 2 blocks   12. Walking 1 mile   13. Going up/down 10 stairs (1 flight)   14. Standing for 1 hour   15.  sitting for 1 hour   16. Running on even ground   17. Running on uneven ground   18. Making sharp turns while running fast   19. Hopping    20. Rolling over in bed   Score total:  1/80     COGNITION: Overall cognitive status: Within functional limits for tasks assessed     SENSATION: WFL  EDEMA:  No noticeable edema  POSTURE: No Significant  postural limitations  PALPATION: No unexpected tenderness to palpation   LOWER EXTREMITY ROM:  Passive ROM Right eval Left eval  Hip flexion  78  Hip extension    Hip abduction    Hip adduction    Hip internal rotation  5  Hip external rotation  Not performed 2nd to protocol   Knee flexion    Knee extension    Ankle dorsiflexion    Ankle plantarflexion    Ankle inversion    Ankle eversion     (Blank rows = not tested)  LOWER EXTREMITY MMT:  MMT Right eval Left eval  Hip flexion    Hip extension    Hip abduction    Hip adduction    Hip internal rotation    Hip external rotation    Knee flexion    Knee extension    Ankle dorsiflexion    Ankle plantarflexion    Ankle inversion    Ankle eversion     (Blank rows = not tested) Not tested 2nd to recent surgery    FUNCTIONAL TESTS:  Using hands to stand form a surface   GAIT: Using 2 crutches. Limited weight bearing at this time                                                                                                                               TREATMENT DATE:   There-ex:  Gentle PROM per protocol Glute sets 5 x20 PPT with adductor sqz 5 2x10 Quadruped anterior/posterior rocking x 20 Seated LAQ 2.5# 2x10 5 hold Seated HSC RTB 2x10 Gait in hall with SPC x 75 feet Long sit and seated EOB HSS  Manual:  Roller to L quads    PATIENT EDUCATION:  Education details: HEP, symptom management  Person educated: Patient Education method: Explanation, Demonstration, Tactile cues, Verbal cues, and Handouts Education comprehension: verbalized understanding, returned demonstration, verbal cues required, tactile cues required, and needs further education  HOME EXERCISE PROGRAM: Access Code: [email or web address] URL: [email or web address] Date: 01/09/2024 Prepared by: Signa Drier  Exercises - Supine Heel Slide with Strap  - 3  x daily - 7 x weekly - 3 sets - 5 reps - 5 hold - Supine Quad Set  - 4-5  x daily - 7 x weekly - 3 sets - 10 reps - Seated Ankle Pumps  - 1 x daily - 6 x weekly - 3 sets - 10 reps - Supine Transversus Abdominis Bracing - Hands on Stomach  - 3-4 x daily - 7 x weekly - 3 sets - 10 reps  ASSESSMENT:  CLINICAL IMPRESSION: Pt is 3 week s/p. She is doing well with use of SPC. Does have lateral hip tenderness which is expected. Quad tightness felt with quadruped rocking, so performed IASTM to quads using roller. Also instructed in HS stretching to add to HEP as she feels tightness here when walking. Pt will be 4 weeks next visit. Plan to try upright bike next visit. Will continue to progress as tolerated.   Eval: Patient is a 48 year old female status post glute med repair and left trochanteric bursa bursectomy.  She presents with expected limitations in motion, strength, and general functional mobility.  She is currently walking with 2 crutches.  She is having pain but it is improving.  She would benefit from skilled therapy to improve her ability to ambulate and perform daily tasks.  OBJECTIVE IMPAIRMENTS: Abnormal gait, decreased activity tolerance, decreased endurance, decreased mobility, difficulty walking, decreased ROM, decreased strength, increased edema, and pain.   ACTIVITY LIMITATIONS: carrying, lifting, bending, sitting, standing, squatting, sleeping, stairs, transfers, bed mobility, bathing, hygiene/grooming, and locomotion level  PARTICIPATION LIMITATIONS: meal prep, cleaning, laundry, driving, shopping, community activity, and occupation  PERSONAL FACTORS: 1-2 comorbidities: anxiety are also affecting patient's functional outcome.   REHAB POTENTIAL: Good  CLINICAL DECISION MAKING: Evolving/moderate complexity Pain limiting general function   EVALUATION COMPLEXITY: Moderate   GOALS: Goals reviewed with patient? Yes  SHORT TERM GOALS: Target date: 02/06/2024   Patient will get in and out of bed independently without increased pain Baseline: Goal  status: IN PROGRESS 6/11  2.  Patient will progress to ambulation without crutches as tolerated Baseline:  Goal status: MET (uses [facility] 6/11)  3.  Patient will demonstrate good glued contraction without increased pain Baseline:  Goal status: MET 6/11  4.  Patient will increase passive range of motion on the left into flexion to 90 degrees Baseline:  Goal status: MET 6/11  5.  Patient will be independent with basic HEP Baseline:  Goal status: MET 6/11   LONG TERM GOALS: Target date: 03/05/2024    Patient will go up and down 2 steps with reciprocal gait pattern in order to get in and out of her house Baseline:  Goal status: INITIAL  2.  Patient will ambulate community distances without increased pain Baseline:  Goal status: INITIAL  3.  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  Patient will have complete exercise program to promote further strengthening and general functional mobility Baseline:  Goal status: INITIAL L   PLAN:  PT FREQUENCY: 2x/week  PT DURATION: 8 weeks  PLANNED INTERVENTIONS: 97110-Therapeutic exercises, 97530- Therapeutic activity, [id number]- Neuromuscular re-education, 97535- Self Care, 16109- Manual therapy, [id number]- Gait training, [phone]- Aquatic Therapy, 97014- Electrical stimulation (unattended), 97035- Ultrasound, Patient/Family education, Stair training, Taping, Dry Needling, DME instructions, Cryotherapy, and Moist heat   PLAN FOR NEXT SESSION: Continue with passive range of motion.  Follow glutes Meade repair protocol.  Progress gait training as  tolerated.   Fronie Jewett Jamair Cato, PTA 01/28/2024, 12:43 PM

## 2024-01-29 ENCOUNTER — Encounter (HOSPITAL_BASED_OUTPATIENT_CLINIC_OR_DEPARTMENT_OTHER): Payer: Self-pay | Admitting: Physical Therapy

## 2024-02-03 ENCOUNTER — Encounter (HOSPITAL_BASED_OUTPATIENT_CLINIC_OR_DEPARTMENT_OTHER): Payer: Self-pay | Admitting: Physical Therapy

## 2024-02-03 ENCOUNTER — Ambulatory Visit (HOSPITAL_BASED_OUTPATIENT_CLINIC_OR_DEPARTMENT_OTHER): Payer: Self-pay | Admitting: Physical Therapy

## 2024-02-03 DIAGNOSIS — M25552 Pain in left hip: Secondary | ICD-10-CM

## 2024-02-03 DIAGNOSIS — M6281 Muscle weakness (generalized): Secondary | ICD-10-CM

## 2024-02-03 DIAGNOSIS — M7062 Trochanteric bursitis, left hip: Secondary | ICD-10-CM

## 2024-02-03 NOTE — Therapy (Signed)
 OUTPATIENT PHYSICAL THERAPY LOWER EXTREMITY treatment   Patient Name: Jennifer Noble MRN: [id number] DOB:04-08-1976, 48 y.o., female Today's Date: 02/03/2024  END OF SESSION:  PT End of Session - 02/03/24 1045     Visit Number 5    Number of Visits 16    Date for PT Re-Evaluation 03/05/24    Authorization Type CAFA    PT Start Time 0930    PT Stop Time 1012    PT Time Calculation (min) 42 min    Activity Tolerance Patient tolerated treatment well    Behavior During Therapy WFL for tasks assessed/performed              Past Medical History:  Diagnosis Date   Anxiety    Depression    Dysuria    Hemorrhoid    High cholesterol    Past Surgical History:  Procedure Laterality Date   GLUTEUS MINIMUS REPAIR Left 01/05/2024   Procedure: REPAIR, TENDON, GLUTEUS MINIMUS;  Surgeon: Wilhelmenia Harada, MD;  [facility];  Service: Orthopedics;  Laterality: Left;  LEFT GLUTEUS MEDIUS REPAIR AND POSSIBLE COLLAGEN PATCH AUGMENTATION   HEMORRHOID SURGERY     Patient Active Problem List   Diagnosis Date Noted   Tear of left gluteus minimus tendon 01/05/2024   Vasomotor symptoms due to menopause 12/03/2022   Headache 05/07/2016   Depression 08/17/2015   Insomnia 08/17/2015   Elevated lipids 04/08/2013   Family history of early CAD 04/08/2013   Hemorrhoid    Dysuria     PCP: Natale Bail NP  REFERRING PROVIDER: DR Heloise Lobo   REFERRING DIAG:  Left glut repair   THERAPY DIAG:  Pain in left hip  Muscle weakness (generalized)  Trochanteric bursitis of left hip  Rationale for Evaluation and Treatment: Rehabilitation  ONSET DATE: DOS 01/05/2024  SUBJECTIVE:   SUBJECTIVE STATEMENT:  Interpretor present.  The patient has no major complaints. She is a little sore laterally. She is still using her cane but will start walking some around the house.     Eval: Patient has a long history of left lateral hip and low back pain.  She  was found to have a glute med tear.  On 01/05/2024 she had a glute med repair as well as a left trochanteric bursectomy.  At this time she is having pain when she is standing and walking.  She is using 2 crutches.  PERTINENT HISTORY: Anxiety, depression  PAIN:  Are you having pain? Yes: NPRS scale: 0/10 right now  Pain location: left hip Pain description: aching  Aggravating factors: Standing/ walking/ night time  Relieving factors: pain medication   PRECAUTIONS: None  RED FLAGS: None   WEIGHT BEARING RESTRICTIONS: Yes WBAT   FALLS:  Has patient fallen in last 6 months? 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  DIAGNOSTIC FINDINGS:  Nothing post op   PATIENT SURVEYS:  LEFS  Extreme difficulty/unable (0), Quite a bit of difficulty (1), Moderate difficulty (2), Little difficulty (3), No difficulty (4) Survey date:    Any of your usual work, housework or school activities   2. Usual hobbies, recreational or sporting activities   3. Getting into/out of the  bath   4. Walking between rooms   5. Putting on socks/shoes   6. Squatting    7. Lifting an object, like a bag of groceries from the floor   8. Performing light activities around your home   9. Performing heavy activities around your home   10. Getting into/out of a car   11. Walking 2 blocks   12. Walking 1 mile   13. Going up/down 10 stairs (1 flight)   14. Standing for 1 hour   15.  sitting for 1 hour   16. Running on even ground   17. Running on uneven ground   18. Making sharp turns while running fast   19. Hopping    20. Rolling over in bed   Score total:  1/80     COGNITION: Overall cognitive status: Within functional limits for  tasks assessed     SENSATION: WFL  EDEMA:  No noticeable edema  POSTURE: No Significant postural limitations  PALPATION: No unexpected tenderness to palpation   LOWER EXTREMITY ROM:  Passive ROM Right eval Left eval  Hip flexion  78  Hip extension    Hip abduction    Hip adduction    Hip internal rotation  5  Hip external rotation  Not performed 2nd to protocol   Knee flexion    Knee extension    Ankle dorsiflexion    Ankle plantarflexion    Ankle inversion    Ankle eversion     (Blank rows = not tested)  LOWER EXTREMITY MMT:  MMT Right eval Left eval  Hip flexion    Hip extension    Hip abduction    Hip adduction    Hip internal rotation    Hip external rotation    Knee flexion    Knee extension    Ankle dorsiflexion    Ankle plantarflexion    Ankle inversion    Ankle eversion     (Blank rows = not tested) Not tested 2nd to recent surgery    FUNCTIONAL TESTS:  Using hands to stand form a surface   GAIT: Using 2 crutches. Limited weight bearing at this time                                                                                                                               TREATMENT DATE:  6/17  Manual: Roller to gluteal and IT band in supine  There-ex:  SAQ x10 RPE of 3  SAQ x12 1.5 lbs RPE of 3  LAQ 1.5 lbs  RPE of 4 2x12   Neuro-re-ed:  Standing weight shift on scales Cuing to keep weight equal when shifting  2x10   Bridge 2x10  PPT 2x10   Reviewed HEP   Last visit There-ex:  Gentle PROM per protocol Glute sets 5 x20 PPT with adductor sqz 5 2x10 Quadruped anterior/posterior rocking x 20 Seated LAQ 2.5# 2x10 5 hold Seated HSC RTB 2x10  Gait in hall with SPC x 75 feet Long sit and seated EOB HSS  Manual:  Roller to L quads    PATIENT EDUCATION:  Education details: HEP, symptom management  Person educated: Patient Education method: Explanation, Demonstration, Tactile cues, Verbal cues, and  Handouts Education comprehension: verbalized understanding, returned demonstration, verbal cues required, tactile cues required, and needs further education  HOME EXERCISE PROGRAM: Access Code: [email or web address] URL: [email or web address] Date: 01/09/2024 Prepared by: Signa Drier  Exercises - Supine Heel Slide with Strap  - 3 x daily - 7 x weekly - 3 sets - 5 reps - 5 hold - Supine Quad Set  - 4-5 x daily - 7 x weekly - 3 sets - 10 reps - Seated Ankle Pumps  - 1 x daily - 6 x weekly - 3 sets - 10 reps - Supine Transversus Abdominis Bracing - Hands on Stomach  - 3-4 x daily - 7 x weekly - 3 sets - 10 reps  ASSESSMENT:  CLINICAL IMPRESSION: Pt is 4 week s/p. She is doing well with use of SPC. We added bridges per protocol. We also worked on weight shifting on the scale. She was equal in standing but shifted weight off going up onto her toes. We will continue to progress per glut med protocol.   Eval: Patient is a 48 year old female status post glute med repair and left trochanteric bursa bursectomy.  She presents with expected limitations in motion, strength, and general functional mobility.  She is currently walking with 2 crutches.  She is having pain but it is improving.  She would benefit from skilled therapy to improve her ability to ambulate and perform daily tasks.  OBJECTIVE IMPAIRMENTS: Abnormal gait, decreased activity tolerance, decreased endurance, decreased mobility, difficulty walking, decreased ROM, decreased strength, increased edema, and pain.   ACTIVITY LIMITATIONS: carrying, lifting, bending, sitting, standing, squatting, sleeping, stairs, transfers, bed mobility, bathing, hygiene/grooming, and locomotion level  PARTICIPATION LIMITATIONS: meal prep, cleaning, laundry, driving, shopping, community activity, and occupation  PERSONAL FACTORS: 1-2 comorbidities: anxiety are also affecting patient's functional outcome.   REHAB POTENTIAL: Good  CLINICAL DECISION  MAKING: Evolving/moderate complexity Pain limiting general function   EVALUATION COMPLEXITY: Moderate   GOALS: Goals reviewed with patient? Yes  SHORT TERM GOALS: Target date: 02/06/2024   Patient will get in and out of bed independently without increased pain Baseline: Goal status: IN PROGRESS 6/11  2.  Patient will progress to ambulation without crutches as tolerated Baseline:  Goal status: MET (uses [facility] 6/11)  3.  Patient will demonstrate good glued contraction without increased pain Baseline:  Goal status: MET 6/11  4.  Patient will increase passive range of motion on the left into flexion to 90 degrees Baseline:  Goal status: MET 6/11  5.  Patient will be independent with basic HEP Baseline:  Goal status: MET 6/11   LONG TERM GOALS: Target date: 03/05/2024    Patient will go up and down 2 steps with reciprocal gait pattern in order to get in and out of her house Baseline:  Goal status: INITIAL  2.  Patient will ambulate community distances without increased pain Baseline:  Goal status: INITIAL  3.  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  Patient will have complete exercise program to promote further strengthening and general functional mobility Baseline:  Goal status: INITIAL L   PLAN:  PT FREQUENCY: 2x/week  PT  DURATION: 8 weeks  PLANNED INTERVENTIONS: 97110-Therapeutic exercises, 97530- Therapeutic activity, [id number]- Neuromuscular re-education, 97535- Self Care, 16109- Manual therapy, [id number]- Gait training, [phone]- Aquatic Therapy, 97014- Electrical stimulation (unattended), 97035- Ultrasound, Patient/Family education, Stair training, Taping, Dry Needling, DME instructions, Cryotherapy, and Moist heat   PLAN FOR NEXT SESSION: Continue with passive range of motion.  Follow glutes Meade repair protocol.  Progress gait training as  tolerated.   Kitty Perkins, PT 02/03/2024, 10:51 AM

## 2024-02-05 ENCOUNTER — Encounter (HOSPITAL_BASED_OUTPATIENT_CLINIC_OR_DEPARTMENT_OTHER): Payer: Self-pay | Admitting: Physical Therapy

## 2024-02-06 ENCOUNTER — Ambulatory Visit (HOSPITAL_BASED_OUTPATIENT_CLINIC_OR_DEPARTMENT_OTHER): Payer: Self-pay

## 2024-02-06 ENCOUNTER — Encounter (HOSPITAL_BASED_OUTPATIENT_CLINIC_OR_DEPARTMENT_OTHER): Payer: Self-pay

## 2024-02-06 DIAGNOSIS — M6281 Muscle weakness (generalized): Secondary | ICD-10-CM

## 2024-02-06 DIAGNOSIS — M7062 Trochanteric bursitis, left hip: Secondary | ICD-10-CM

## 2024-02-06 DIAGNOSIS — M25552 Pain in left hip: Secondary | ICD-10-CM

## 2024-02-06 NOTE — Therapy (Signed)
 OUTPATIENT PHYSICAL THERAPY LOWER EXTREMITY treatment   Patient Name: Jennifer Noble MRN: [id number] DOB:10-Jan-1976, 48 y.o., female Today's Date: 02/06/2024  END OF SESSION:  PT End of Session - 02/06/24 0931     Visit Number 6    Number of Visits 16    Date for PT Re-Evaluation 03/05/24    Authorization Type CAFA    PT Start Time 0932    PT Stop Time 1010    PT Time Calculation (min) 38 min    Activity Tolerance Patient tolerated treatment well    Behavior During Therapy WFL for tasks assessed/performed               Past Medical History:  Diagnosis Date   Anxiety    Depression    Dysuria    Hemorrhoid    High cholesterol    Past Surgical History:  Procedure Laterality Date   GLUTEUS MINIMUS REPAIR Left 01/05/2024   Procedure: REPAIR, TENDON, GLUTEUS MINIMUS;  Surgeon: Wilhelmenia Harada, MD;  Location: [facility];  Service: Orthopedics;  Laterality: Left;  LEFT GLUTEUS MEDIUS REPAIR AND POSSIBLE COLLAGEN PATCH AUGMENTATION   HEMORRHOID SURGERY     Patient Active Problem List   Diagnosis Date Noted   Tear of left gluteus minimus tendon 01/05/2024   Vasomotor symptoms due to menopause 12/03/2022   Headache 05/07/2016   Depression 08/17/2015   Insomnia 08/17/2015   Elevated lipids 04/08/2013   Family history of early CAD 04/08/2013   Hemorrhoid    Dysuria     PCP: Natale Bail NP  REFERRING PROVIDER: DR Heloise Lobo   REFERRING DIAG:  Left glut repair   THERAPY DIAG:  Pain in left hip  Muscle weakness (generalized)  Trochanteric bursitis of left hip  Rationale for Evaluation and Treatment: Rehabilitation  ONSET DATE: DOS 01/05/2024  SUBJECTIVE:   SUBJECTIVE STATEMENT:  Interpretor present.  A little sore in front of hip when walking. Arrives without cane.    Eval: Patient has a long history of left lateral hip and low back pain.  She was found to have a glute med tear.  On 01/05/2024 she had a glute med  repair as well as a left trochanteric bursectomy.  At this time she is having pain when she is standing and walking.  She is using 2 crutches.  PERTINENT HISTORY: Anxiety, depression  PAIN:  Are you having pain? Yes: NPRS scale: 0/10 right now  Pain location: left hip Pain description: aching  Aggravating factors: Standing/ walking/ night time  Relieving factors: pain medication   PRECAUTIONS: None  RED FLAGS: None   WEIGHT BEARING RESTRICTIONS: Yes WBAT   FALLS:  Has patient fallen in last 6 months? 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  DIAGNOSTIC FINDINGS:  Nothing post op   PATIENT SURVEYS:  LEFS  Extreme difficulty/unable (0), Quite a bit of difficulty (1), Moderate difficulty (2), Little difficulty (3), No difficulty (4) Survey date:    Any of your usual work, housework or school activities   2. Usual hobbies, recreational or sporting activities   3. Getting into/out of the bath   4. Walking between rooms   5. Putting on socks/shoes  6. Squatting    7. Lifting an object, like a bag of groceries from the floor   8. Performing light activities around your home   9. Performing heavy activities around your home   10. Getting into/out of a car   11. Walking 2 blocks   12. Walking 1 mile   13. Going up/down 10 stairs (1 flight)   14. Standing for 1 hour   15.  sitting for 1 hour   16. Running on even ground   17. Running on uneven ground   18. Making sharp turns while running fast   19. Hopping    20. Rolling over in bed   Score total:  1/80     COGNITION: Overall cognitive status: Within functional limits for tasks assessed     SENSATION: WFL  EDEMA:  No noticeable  edema  POSTURE: No Significant postural limitations  PALPATION: No unexpected tenderness to palpation   LOWER EXTREMITY ROM:  Passive ROM Right eval Left eval  Hip flexion  78  Hip extension    Hip abduction    Hip adduction    Hip internal rotation  5  Hip external rotation  Not performed 2nd to protocol   Knee flexion    Knee extension    Ankle dorsiflexion    Ankle plantarflexion    Ankle inversion    Ankle eversion     (Blank rows = not tested)  LOWER EXTREMITY MMT:  MMT Right eval Left eval  Hip flexion    Hip extension    Hip abduction    Hip adduction    Hip internal rotation    Hip external rotation    Knee flexion    Knee extension    Ankle dorsiflexion    Ankle plantarflexion    Ankle inversion    Ankle eversion     (Blank rows = not tested) Not tested 2nd to recent surgery    FUNCTIONAL TESTS:  Using hands to stand form a surface   GAIT: Using 2 crutches. Limited weight bearing at this time                                                                                                                               TREATMENT DATE:  6/20  Manual: STM to proximal hip flexors   There-ex:  PROM L hip in protocol ranges SAQ with 2.5lbs 2x10 3 hold LAQ 2.5# lbs 2x10 5 hold HSC RTB 2x10 Bridge 2x10  PPT 2x10  Gait in hal without cane Standing HR/TR x20 Standing HSC 2x10 Trialed standing hip extension but caused pain so stopped Nu-step x44min L3    PATIENT EDUCATION:  Education details: HEP, symptom management  Person educated: Patient Education method: Explanation, Demonstration, Tactile cues, Verbal cues, and Handouts Education comprehension: verbalized understanding, returned demonstration, verbal cues required, tactile cues required, and needs further education  HOME EXERCISE PROGRAM: Access Code: [email or web address] URL: [email or web address] Date: 01/09/2024 Prepared  by: Signa Drier  Exercises - Supine Heel Slide  with Strap  - 3 x daily - 7 x weekly - 3 sets - 5 reps - 5 hold - Supine Quad Set  - 4-5 x daily - 7 x weekly - 3 sets - 10 reps - Seated Ankle Pumps  - 1 x daily - 6 x weekly - 3 sets - 10 reps - Supine Transversus Abdominis Bracing - Hands on Stomach  - 3-4 x daily - 7 x weekly - 3 sets - 10 reps  ASSESSMENT:  CLINICAL IMPRESSION: Pt continues with good tolerance for exercise. Added standing HR/TR without c/o. Trialed standing hip extension for L LE which did cause discomfort, so discontinued. C/o anterior hip soreness with gait. Performed STM to proximal hip flexors to decrease this. Instructed pt to try using tennis bal for massage here gently at home. Pt can also continue with ice as needed.   Eval: Patient is a 48 year old female status post glute med repair and left trochanteric bursa bursectomy.  She presents with expected limitations in motion, strength, and general functional mobility.  She is currently walking with 2 crutches.  She is having pain but it is improving.  She would benefit from skilled therapy to improve her ability to ambulate and perform daily tasks.  OBJECTIVE IMPAIRMENTS: Abnormal gait, decreased activity tolerance, decreased endurance, decreased mobility, difficulty walking, decreased ROM, decreased strength, increased edema, and pain.   ACTIVITY LIMITATIONS: carrying, lifting, bending, sitting, standing, squatting, sleeping, stairs, transfers, bed mobility, bathing, hygiene/grooming, and locomotion level  PARTICIPATION LIMITATIONS: meal prep, cleaning, laundry, driving, shopping, community activity, and occupation  PERSONAL FACTORS: 1-2 comorbidities: anxiety are also affecting patient's functional outcome.   REHAB POTENTIAL: Good  CLINICAL DECISION MAKING: Evolving/moderate complexity Pain limiting general function   EVALUATION COMPLEXITY: Moderate   GOALS: Goals reviewed with patient? Yes  SHORT TERM GOALS: Target date: 02/06/2024   Patient will get in  and out of bed independently without increased pain Baseline: Goal status: IN PROGRESS 6/11  2.  Patient will progress to ambulation without crutches as tolerated Baseline:  Goal status: MET (uses [facility] 6/11)  3.  Patient will demonstrate good glued contraction without increased pain Baseline:  Goal status: MET 6/11  4.  Patient will increase passive range of motion on the left into flexion to 90 degrees Baseline:  Goal status: MET 6/11  5.  Patient will be independent with basic HEP Baseline:  Goal status: MET 6/11   LONG TERM GOALS: Target date: 03/05/2024    Patient will go up and down 2 steps with reciprocal gait pattern in order to get in and out of her house Baseline:  Goal status: INITIAL  2.  Patient will ambulate community distances without increased pain Baseline:  Goal status: INITIAL  3.  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  Patient will have complete exercise program to promote further strengthening and general functional mobility Baseline:  Goal status: INITIAL L   PLAN:  PT FREQUENCY: 2x/week  PT DURATION: 8 weeks  PLANNED INTERVENTIONS: 97110-Therapeutic exercises, 97530- Therapeutic activity, [id number]- Neuromuscular re-education, 97535- Self Care, 16109- Manual therapy, [id number]- Gait training, [phone]- Aquatic Therapy, 97014- Electrical stimulation (unattended), 97035- Ultrasound, Patient/Family education, Stair training, Taping, Dry Needling, DME instructions, Cryotherapy, and Moist heat   PLAN FOR NEXT SESSION: Continue with passive range of motion.  Follow glutes Meade repair protocol.  Progress gait training as tolerated.   Fronie Jewett Shameeka Silliman, PTA 02/06/2024, 10:46 AM

## 2024-02-06 NOTE — Therapy (Deleted)
 OUTPATIENT PHYSICAL THERAPY LOWER EXTREMITY treatment   Patient Name: Jennifer Noble MRN: [id number] DOB:[birth year], 48 y.o., female Today's Date: 02/06/2024  END OF SESSION:        Past Medical History:  Diagnosis Date   Anxiety    Depression    Dysuria    Hemorrhoid    High cholesterol    Past Surgical History:  Procedure Laterality Date   GLUTEUS MINIMUS REPAIR Left 01/05/2024   Procedure: REPAIR, TENDON, GLUTEUS MINIMUS;  Surgeon: Wilhelmenia Harada, MD;  Location: [facility];  Service: Orthopedics;  Laterality: Left;  LEFT GLUTEUS MEDIUS REPAIR AND POSSIBLE COLLAGEN PATCH AUGMENTATION   HEMORRHOID SURGERY     Patient Active Problem List   Diagnosis Date Noted   Tear of left gluteus minimus tendon 01/05/2024   Vasomotor symptoms due to menopause 12/03/2022   Headache 05/07/2016   Depression 08/17/2015   Insomnia 08/17/2015   Elevated lipids 04/08/2013   Family history of early CAD 04/08/2013   Hemorrhoid    Dysuria     PCP: Natale Bail NP  REFERRING PROVIDER: DR Heloise Lobo   REFERRING DIAG:  Left glut repair   THERAPY DIAG:  No diagnosis found.  Rationale for Evaluation and Treatment: Rehabilitation  ONSET DATE: DOS 01/05/2024  SUBJECTIVE:   SUBJECTIVE STATEMENT:  Interpretor present.  The patient has no major complaints. She is a little sore laterally. She is still using her cane but will start walking some around the house.     Eval: Patient has a long history of left lateral hip and low back pain.  She was found to have a glute med tear.  On 01/05/2024 she had a glute med repair as well as a left trochanteric bursectomy.  At this time she is having pain when she is standing and walking.  She is using 2 crutches.  PERTINENT HISTORY: Anxiety, depression  PAIN:  Are you having pain? Yes: NPRS scale: 0/10 right now  Pain location: left hip Pain description: aching  Aggravating factors: Standing/ walking/ night  time  Relieving factors: pain medication   PRECAUTIONS: None  RED FLAGS: None   WEIGHT BEARING RESTRICTIONS: Yes WBAT   FALLS:  Has patient fallen in last 6 months? 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  DIAGNOSTIC FINDINGS:  Nothing post op   PATIENT SURVEYS:  LEFS  Extreme difficulty/unable (0), Quite a bit of difficulty (1), Moderate difficulty (2), Little difficulty (3), No difficulty (4) Survey date:    Any of your usual work, housework or school activities   2. Usual hobbies, recreational or sporting activities   3. Getting into/out of the bath   4. Walking between rooms   5. Putting on socks/shoes   6. Squatting    7. Lifting an object, like a bag of groceries from the floor   8. Performing light activities around your home   9. Performing heavy activities around your home   10. Getting into/out of a car   11. Walking 2 blocks   12. Walking 1 mile   13. Going up/down 10 stairs (1 flight)   14. Standing for 1 hour   15.  sitting for 1 hour  16. Running on even ground   17. Running on uneven ground   18. Making sharp turns while running fast   19. Hopping    20. Rolling over in bed   Score total:  1/80     COGNITION: Overall cognitive status: Within functional limits for tasks assessed     SENSATION: WFL  EDEMA:  No noticeable edema  POSTURE: No Significant postural limitations  PALPATION: No unexpected tenderness to palpation   LOWER EXTREMITY ROM:  Passive ROM Right eval Left eval  Hip flexion  78  Hip extension    Hip abduction    Hip adduction    Hip internal rotation  5  Hip external rotation  Not performed 2nd to protocol   Knee flexion    Knee extension     Ankle dorsiflexion    Ankle plantarflexion    Ankle inversion    Ankle eversion     (Blank rows = not tested)  LOWER EXTREMITY MMT:  MMT Right eval Left eval  Hip flexion    Hip extension    Hip abduction    Hip adduction    Hip internal rotation    Hip external rotation    Knee flexion    Knee extension    Ankle dorsiflexion    Ankle plantarflexion    Ankle inversion    Ankle eversion     (Blank rows = not tested) Not tested 2nd to recent surgery    FUNCTIONAL TESTS:  Using hands to stand form a surface   GAIT: Using 2 crutches. Limited weight bearing at this time                                                                                                                               TREATMENT DATE:   6/20  Manual: Roller to gluteal and IT band in supine  There-ex:  SAQ x10 RPE of 3  SAQ x12 1.5 lbs RPE of 3  LAQ 1.5 lbs  RPE of 4 2x12   Neuro-re-ed:  Standing weight shift on scales Cuing to keep weight equal when shifting  2x10   Bridge 2x10  PPT 2x10   Reviewed HEP    6/17  Manual: Roller to gluteal and IT band in supine  There-ex:  SAQ x10 RPE of 3  SAQ x12 1.5 lbs RPE of 3  LAQ 1.5 lbs  RPE of 4 2x12   Neuro-re-ed:  Standing weight shift on scales Cuing to keep weight equal when shifting  2x10   Bridge 2x10  PPT 2x10   Reviewed HEP   Last visit There-ex:  Gentle PROM per protocol Glute sets 5 x20 PPT with adductor sqz 5 2x10 Quadruped anterior/posterior rocking x 20 Seated LAQ 2.5# 2x10 5 hold Seated HSC RTB 2x10 Gait in hall with SPC x 75 feet Long sit and seated EOB HSS  Manual:  Roller to L quads    PATIENT EDUCATION:  Education details: HEP, symptom management  Person educated: Patient Education method: Explanation, Demonstration, Tactile cues, Verbal cues, and Handouts Education comprehension: verbalized understanding, returned demonstration, verbal cues required, tactile cues required, and needs  further education  HOME EXERCISE PROGRAM: Access Code: [email or web address] URL: [email or web address] Date: 01/09/2024 Prepared by: Signa Drier  Exercises - Supine Heel Slide with Strap  - 3 x daily - 7 x weekly - 3 sets - 5 reps - 5 hold - Supine Quad Set  - 4-5 x daily - 7 x weekly - 3 sets - 10 reps - Seated Ankle Pumps  - 1 x daily - 6 x weekly - 3 sets - 10 reps - Supine Transversus Abdominis Bracing - Hands on Stomach  - 3-4 x daily - 7 x weekly - 3 sets - 10 reps  ASSESSMENT:  CLINICAL IMPRESSION: Pt is 4 week s/p. She is doing well with use of SPC. We added bridges per protocol. We also worked on weight shifting on the scale. She was equal in standing but shifted weight off going up onto her toes. We will continue to progress per glut med protocol.   Eval: Patient is a 48 year old female status post glute med repair and left trochanteric bursa bursectomy.  She presents with expected limitations in motion, strength, and general functional mobility.  She is currently walking with 2 crutches.  She is having pain but it is improving.  She would benefit from skilled therapy to improve her ability to ambulate and perform daily tasks.  OBJECTIVE IMPAIRMENTS: Abnormal gait, decreased activity tolerance, decreased endurance, decreased mobility, difficulty walking, decreased ROM, decreased strength, increased edema, and pain.   ACTIVITY LIMITATIONS: carrying, lifting, bending, sitting, standing, squatting, sleeping, stairs, transfers, bed mobility, bathing, hygiene/grooming, and locomotion level  PARTICIPATION LIMITATIONS: meal prep, cleaning, laundry, driving, shopping, community activity, and occupation  PERSONAL FACTORS: 1-2 comorbidities: anxiety are also affecting patient's functional outcome.   REHAB POTENTIAL: Good  CLINICAL DECISION MAKING: Evolving/moderate complexity Pain limiting general function   EVALUATION COMPLEXITY: Moderate   GOALS: Goals reviewed with  patient? Yes  SHORT TERM GOALS: Target date: 02/06/2024   Patient will get in and out of bed independently without increased pain Baseline: Goal status: IN PROGRESS 6/11  2.  Patient will progress to ambulation without crutches as tolerated Baseline:  Goal status: MET (uses [facility] 6/11)  3.  Patient will demonstrate good glued contraction without increased pain Baseline:  Goal status: MET 6/11  4.  Patient will increase passive range of motion on the left into flexion to 90 degrees Baseline:  Goal status: MET 6/11  5.  Patient will be independent with basic HEP Baseline:  Goal status: MET 6/11   LONG TERM GOALS: Target date: 03/05/2024    Patient will go up and down 2 steps with reciprocal gait pattern in order to get in and out of her house Baseline:  Goal status: INITIAL  2.  Patient will ambulate community distances without increased pain Baseline:  Goal status: INITIAL  3.  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  Patient will have complete exercise program to promote further strengthening and general functional mobility Baseline:  Goal status: INITIAL L   PLAN:  PT FREQUENCY: 2x/week  PT DURATION: 8 weeks  PLANNED INTERVENTIONS: 97110-Therapeutic exercises, 97530- Therapeutic activity, [id number]- Neuromuscular re-education, 97535- Self Care, [id number]- Manual therapy, [id number]- Gait training, [id number]- Aquatic Therapy, 97014-  Electrical stimulation (unattended), [id number]- Ultrasound, Patient/Family education, Stair training, Taping, Dry Needling, DME instructions, Cryotherapy, and Moist heat   PLAN FOR NEXT SESSION: Continue with passive range of motion.  Follow glutes Meade repair protocol.  Progress gait training as tolerated.   Fronie Jewett Eulis Salazar, PTA 02/06/2024, 8:56 AM

## 2024-02-10 ENCOUNTER — Encounter (HOSPITAL_BASED_OUTPATIENT_CLINIC_OR_DEPARTMENT_OTHER): Payer: Self-pay

## 2024-02-10 ENCOUNTER — Ambulatory Visit (HOSPITAL_BASED_OUTPATIENT_CLINIC_OR_DEPARTMENT_OTHER): Payer: Self-pay

## 2024-02-10 DIAGNOSIS — M25552 Pain in left hip: Secondary | ICD-10-CM

## 2024-02-10 DIAGNOSIS — M6281 Muscle weakness (generalized): Secondary | ICD-10-CM

## 2024-02-10 DIAGNOSIS — M7062 Trochanteric bursitis, left hip: Secondary | ICD-10-CM

## 2024-02-10 NOTE — Therapy (Signed)
 OUTPATIENT PHYSICAL THERAPY LOWER EXTREMITY treatment   Patient Name: Jennifer Noble MRN: [id number] DOB:1975-09-24, 48 y.o., female Today's Date: 02/10/2024  END OF SESSION:  PT End of Session - 02/10/24 0938     Visit Number 7    Number of Visits 16    Date for PT Re-Evaluation 03/05/24    Authorization Type CAFA    PT Start Time 0933    PT Stop Time 1015    PT Time Calculation (min) 42 min    Activity Tolerance Patient tolerated treatment well    Behavior During Therapy [facility] for tasks assessed/performed                Past Medical History:  Diagnosis Date   Anxiety    Depression    Dysuria    Hemorrhoid    High cholesterol    Past Surgical History:  Procedure Laterality Date   GLUTEUS MINIMUS REPAIR Left 01/05/2024   Procedure: REPAIR, TENDON, GLUTEUS MINIMUS;  Surgeon: Genelle Standing, MD;  Location: [facility];  Service: Orthopedics;  Laterality: Left;  LEFT GLUTEUS MEDIUS REPAIR AND POSSIBLE COLLAGEN PATCH AUGMENTATION   HEMORRHOID SURGERY     Patient Active Problem List   Diagnosis Date Noted   Tear of left gluteus minimus tendon 01/05/2024   Vasomotor symptoms due to menopause 12/03/2022   Headache 05/07/2016   Depression 08/17/2015   Insomnia 08/17/2015   Elevated lipids 04/08/2013   Family history of early CAD 04/08/2013   Hemorrhoid    Dysuria     PCP: Haze Arlington NP  REFERRING PROVIDER: DR Elia Genelle   REFERRING DIAG:  Left glut repair   THERAPY DIAG:  Trochanteric bursitis of left hip  Muscle weakness (generalized)  Pain in left hip  Rationale for Evaluation and Treatment: Rehabilitation  ONSET DATE: DOS 01/05/2024  SUBJECTIVE:   SUBJECTIVE STATEMENT:  Interpretor present.  A little sore in front of hip when walking. Arrives without cane.    Eval: Patient has a long history of left lateral hip and low back pain.  She was found to have a glute med tear.  On 01/05/2024 she had a glute med  repair as well as a left trochanteric bursectomy.  At this time she is having pain when she is standing and walking.  She is using 2 crutches.  PERTINENT HISTORY: Anxiety, depression  PAIN:  Are you having pain? Yes: NPRS scale: 0/10 right now  Pain location: left hip Pain description: aching  Aggravating factors: Standing/ walking/ night time  Relieving factors: pain medication   PRECAUTIONS: None  RED FLAGS: None   WEIGHT BEARING RESTRICTIONS: Yes WBAT   FALLS:  Has patient fallen in last 6 months? 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  DIAGNOSTIC FINDINGS:  Nothing post op   PATIENT SURVEYS:  LEFS  Extreme difficulty/unable (0), Quite a bit of difficulty (1), Moderate difficulty (2), Little difficulty (3), No difficulty (4) Survey date:    Any of your usual work, housework or school activities   2. Usual hobbies, recreational or sporting activities   3. Getting into/out of the bath   4. Walking between rooms   5. Putting on socks/shoes  6. Squatting    7. Lifting an object, like a bag of groceries from the floor   8. Performing light activities around your home   9. Performing heavy activities around your home   10. Getting into/out of a car   11. Walking 2 blocks   12. Walking 1 mile   13. Going up/down 10 stairs (1 flight)   14. Standing for 1 hour   15.  sitting for 1 hour   16. Running on even ground   17. Running on uneven ground   18. Making sharp turns while running fast   19. Hopping    20. Rolling over in bed   Score total:  1/80     COGNITION: Overall cognitive status: Within functional limits for tasks assessed     SENSATION: WFL  EDEMA:  No noticeable  edema  POSTURE: No Significant postural limitations  PALPATION: No unexpected tenderness to palpation   LOWER EXTREMITY ROM:  Passive ROM Right eval Left eval  Hip flexion  78  Hip extension    Hip abduction    Hip adduction    Hip internal rotation  5  Hip external rotation  Not performed 2nd to protocol   Knee flexion    Knee extension    Ankle dorsiflexion    Ankle plantarflexion    Ankle inversion    Ankle eversion     (Blank rows = not tested)  LOWER EXTREMITY MMT:  MMT Right eval Left eval  Hip flexion    Hip extension    Hip abduction    Hip adduction    Hip internal rotation    Hip external rotation    Knee flexion    Knee extension    Ankle dorsiflexion    Ankle plantarflexion    Ankle inversion    Ankle eversion     (Blank rows = not tested) Not tested 2nd to recent surgery    FUNCTIONAL TESTS:  Using hands to stand form a surface   GAIT: Using 2 crutches. Limited weight bearing at this time                                                                                                                               TREATMENT DATE:   6/24  Manual: Roller to proximal hip flexors, glutes, HS in supine and prone positions.   There-ex:  Nu-step x28min L3 PROM L hip in protocol ranges LAQ 2.5# lbs 2x10 5 hold Prone HSC x20 HSC RTB 2x10 Bridge 2x10  Standing HR/TR x20 Standing march 2x10   6/20  Manual: STM to proximal hip flexors   There-ex:  PROM L hip in protocol ranges SAQ with 2.5lbs 2x10 3 hold LAQ 2.5# lbs 2x10 5 hold HSC RTB 2x10 Bridge 2x10  PPT 2x10  Gait in hal without cane Standing HR/TR x20 Standing HSC 2x10 Trialed standing hip extension but caused pain so stopped Nu-step x24min L3  PATIENT EDUCATION:  Education details: HEP, symptom management  Person educated: Patient Education method: Explanation, Demonstration, Tactile cues, Verbal cues, and Handouts Education comprehension: verbalized  understanding, returned demonstration, verbal cues required, tactile cues required, and needs further education  HOME EXERCISE PROGRAM: Access Code: [email or web address] URL: [email or web address] Date: 01/09/2024 Prepared by: Alm Don  Exercises - Supine Heel Slide with Strap  - 3 x daily - 7 x weekly - 3 sets - 5 reps - 5 hold - Supine Quad Set  - 4-5 x daily - 7 x weekly - 3 sets - 10 reps - Seated Ankle Pumps  - 1 x daily - 6 x weekly - 3 sets - 10 reps - Supine Transversus Abdominis Bracing - Hands on Stomach  - 3-4 x daily - 7 x weekly - 3 sets - 10 reps  ASSESSMENT:  CLINICAL IMPRESSION: Pt nearing 5 weeks s/p and progressing well. Continues to have tightness in anterior hip as well as HS, so spent time on IASTM using roller to affected areas. She reported benefit from this with increased mobility. Good tolerance for protocol based interventions. Will progress at 6 weeks as appropriate.   Eval: Patient is a 48 year old female status post glute med repair and left trochanteric bursa bursectomy.  She presents with expected limitations in motion, strength, and general functional mobility.  She is currently walking with 2 crutches.  She is having pain but it is improving.  She would benefit from skilled therapy to improve her ability to ambulate and perform daily tasks.  OBJECTIVE IMPAIRMENTS: Abnormal gait, decreased activity tolerance, decreased endurance, decreased mobility, difficulty walking, decreased ROM, decreased strength, increased edema, and pain.   ACTIVITY LIMITATIONS: carrying, lifting, bending, sitting, standing, squatting, sleeping, stairs, transfers, bed mobility, bathing, hygiene/grooming, and locomotion level  PARTICIPATION LIMITATIONS: meal prep, cleaning, laundry, driving, shopping, community activity, and occupation  PERSONAL FACTORS: 1-2 comorbidities: anxiety are also affecting patient's functional outcome.   REHAB POTENTIAL: Good  CLINICAL DECISION  MAKING: Evolving/moderate complexity Pain limiting general function   EVALUATION COMPLEXITY: Moderate   GOALS: Goals reviewed with patient? Yes  SHORT TERM GOALS: Target date: 02/06/2024   Patient will get in and out of bed independently without increased pain Baseline: Goal status: IN PROGRESS 6/11  2.  Patient will progress to ambulation without crutches as tolerated Baseline:  Goal status: MET (uses [facility] 6/11)  3.  Patient will demonstrate good glued contraction without increased pain Baseline:  Goal status: MET 6/11  4.  Patient will increase passive range of motion on the left into flexion to 90 degrees Baseline:  Goal status: MET 6/11  5.  Patient will be independent with basic HEP Baseline:  Goal status: MET 6/11   LONG TERM GOALS: Target date: 03/05/2024    Patient will go up and down 2 steps with reciprocal gait pattern in order to get in and out of her house Baseline:  Goal status: INITIAL  2.  Patient will ambulate community distances without increased pain Baseline:  Goal status: INITIAL  3.  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  Patient will have complete exercise program to promote further strengthening and general functional mobility Baseline:  Goal status: INITIAL L   PLAN:  PT FREQUENCY: 2x/week  PT DURATION: 8 weeks  PLANNED INTERVENTIONS: 97110-Therapeutic exercises, 97530- Therapeutic activity, [id number]- Neuromuscular re-education, 97535- Self Care, [id number]- Manual therapy, [id number]- Gait training, [phone]-  Aquatic Therapy, 97014- Electrical stimulation (unattended), [id number]- Ultrasound, Patient/Family education, Stair training, Taping, Dry Needling, DME instructions, Cryotherapy, and Moist heat   PLAN FOR NEXT SESSION: Continue with passive range of motion.  Follow glutes Meade repair protocol.  Progress gait training as  tolerated.   Asberry BRAVO Drago Hammonds, PTA 02/10/2024, 1:05 PM

## 2024-02-11 ENCOUNTER — Telehealth: Payer: Self-pay

## 2024-02-11 NOTE — Telephone Encounter (Signed)
 Patient came in requesting a call back for lab results from 01/03/2024.

## 2024-02-12 ENCOUNTER — Ambulatory Visit (INDEPENDENT_AMBULATORY_CARE_PROVIDER_SITE_OTHER): Payer: Self-pay | Admitting: Orthopaedic Surgery

## 2024-02-12 ENCOUNTER — Encounter (HOSPITAL_BASED_OUTPATIENT_CLINIC_OR_DEPARTMENT_OTHER): Payer: Self-pay

## 2024-02-12 ENCOUNTER — Ambulatory Visit (HOSPITAL_BASED_OUTPATIENT_CLINIC_OR_DEPARTMENT_OTHER): Payer: Self-pay

## 2024-02-12 DIAGNOSIS — M7062 Trochanteric bursitis, left hip: Secondary | ICD-10-CM

## 2024-02-12 DIAGNOSIS — M25552 Pain in left hip: Secondary | ICD-10-CM

## 2024-02-12 DIAGNOSIS — M6281 Muscle weakness (generalized): Secondary | ICD-10-CM

## 2024-02-12 DIAGNOSIS — S76012D Strain of muscle, fascia and tendon of left hip, subsequent encounter: Secondary | ICD-10-CM

## 2024-02-12 LAB — FECAL OCCULT BLOOD, IMMUNOCHEMICAL: Fecal Occult Bld: NEGATIVE

## 2024-02-12 NOTE — Progress Notes (Signed)
 Post Operative Evaluation    Procedure/Date of Surgery: Left hip gluteus medius repair 5/19  Interval History:   Presents 6 weeks status post above procedure.  Overall she is doing very well.  She is continuing to improve although having some soreness about the lateral aspect of the hip.  She is now off of crutches.  She has remained out of work  PMH/PSH/Family History/Social History/Meds/Allergies:    Past Medical History:  Diagnosis Date   Anxiety    Depression    Dysuria    Hemorrhoid    High cholesterol    Past Surgical History:  Procedure Laterality Date   GLUTEUS MINIMUS REPAIR Left 01/05/2024   Procedure: REPAIR, TENDON, GLUTEUS MINIMUS;  Surgeon: Genelle Standing, MD;  Location: [facility];  Service: Orthopedics;  Laterality: Left;  LEFT GLUTEUS MEDIUS REPAIR AND POSSIBLE COLLAGEN PATCH AUGMENTATION   HEMORRHOID SURGERY     Social History   Socioeconomic History   Marital status: Married    Spouse name: Not on file   Number of children: 3   Years of education: Not on file   Highest education level: 6th grade  Occupational History   Not on file  Tobacco Use   Smoking status: Never   Smokeless tobacco: Never  Vaping Use   Vaping status: Never Used  Substance and Sexual Activity   Alcohol use: No   Drug use: No   Sexual activity: Yes    Birth control/protection: Post-menopausal  Other Topics Concern   Not on file  Social History Narrative   Not on file   Social Drivers of Health   Financial Resource Strain: Not on file  Food Insecurity: Food Insecurity Present (12/03/2022)   Hunger Vital Sign    Worried About Running Out of Food in the Last Year: Sometimes true    Ran Out of Food in the Last Year: Sometimes true  Transportation Needs: No Transportation Needs (12/03/2022)   PRAPARE - Administrator, Civil Service (Medical): No    Lack of Transportation (Non-Medical): No  Physical Activity: Not  on file  Stress: Not on file  Social Connections: Not on file   Family History  Problem Relation Age of Onset   Diabetes Mother    Diabetes Sister    Diabetes Brother    Heart disease Other    Breast cancer Neg Hx    Allergies  Allergen Reactions   Naproxen  Nausea And Vomiting   Current Outpatient Medications  Medication Sig Dispense Refill   aspirin  EC 325 MG tablet Take 1 tablet (325 mg total) by mouth daily. 14 tablet 0   citalopram  (CELEXA ) 20 MG tablet Take 1 tablet (20 mg total) by mouth daily. 90 tablet 3   gabapentin  (NEURONTIN ) 300 MG capsule Take 1 capsule (300 mg total) by mouth 2 (two) times daily. (Patient not taking: Reported on 01/09/2024) 60 capsule 1   nitroGLYCERIN  (NITRODUR - DOSED IN MG/24 HR) 0.2 mg/hr patch Cut patch into fourths. Place 1/4 patch over affected area on hip. Change every 24 hours. (Patient not taking: Reported on 12/29/2023) 30 patch 0   oxyCODONE  (ROXICODONE ) 5 MG immediate release tablet Take 1 tablet (5 mg total) by mouth every 4 (four) hours as needed for severe pain (pain score 7-10) or breakthrough pain. 15 tablet 0  No current facility-administered medications for this visit.   No results found.  Review of Systems:   A ROS was performed including pertinent positives and negatives as documented in the HPI.   Musculoskeletal Exam:    Left hip with little to no pain.  She has a well-appearing incision without erythema or drainage.  Distal neurosensory exam is intact.  Abduction strength deferred today internal/external rotation of the left hip is to 30 degrees  Imaging:      I personally reviewed and interpreted the radiographs.   Assessment:   6 weeks status post left hip gluteus medius tendon repair overall doing treatment well.  At this time she will into the strengthening and conditioning portion of the protocol.  I will plan to see her back in 4 weeks.  I did discuss that at that time we could possibly return her to work  although we will plan to assess her at that point.  Plan :    - Return to clinic 4 weeks for reassessment      I personally saw and evaluated the patient, and participated in the management and treatment plan.  Elspeth Parker, MD Attending Physician, Orthopedic Surgery  This document was dictated using Dragon voice recognition software. A reasonable attempt at proof reading has been made to minimize errors.

## 2024-02-12 NOTE — Therapy (Signed)
 OUTPATIENT PHYSICAL THERAPY LOWER EXTREMITY treatment   Patient Name: Jennifer Noble MRN: [id number] DOB:[birth year], 48 y.o., female Today's Date: 02/12/2024  END OF SESSION:  PT End of Session - 02/12/24 1120     Visit Number 8    Number of Visits 16    Date for PT Re-Evaluation 03/05/24    Authorization Type CAFA    PT Start Time 0934    PT Stop Time 1016    PT Time Calculation (min) 42 min    Activity Tolerance Patient tolerated treatment well    Behavior During Therapy [facility] for tasks assessed/performed                 Past Medical History:  Diagnosis Date   Anxiety    Depression    Dysuria    Hemorrhoid    High cholesterol    Past Surgical History:  Procedure Laterality Date   GLUTEUS MINIMUS REPAIR Left 01/05/2024   Procedure: REPAIR, TENDON, GLUTEUS MINIMUS;  Surgeon: Genelle Standing, MD;  Location: [facility];  Service: Orthopedics;  Laterality: Left;  LEFT GLUTEUS MEDIUS REPAIR AND POSSIBLE COLLAGEN PATCH AUGMENTATION   HEMORRHOID SURGERY     Patient Active Problem List   Diagnosis Date Noted   Tear of left gluteus minimus tendon 01/05/2024   Vasomotor symptoms due to menopause 12/03/2022   Headache 05/07/2016   Depression 08/17/2015   Insomnia 08/17/2015   Elevated lipids 04/08/2013   Family history of early CAD 04/08/2013   Hemorrhoid    Dysuria     PCP: Haze Arlington NP  REFERRING PROVIDER: DR Elia Genelle   REFERRING DIAG:  Left glut repair   THERAPY DIAG:  Pain in left hip  Muscle weakness (generalized)  Trochanteric bursitis of left hip  Rationale for Evaluation and Treatment: Rehabilitation  ONSET DATE: DOS 01/05/2024  SUBJECTIVE:   SUBJECTIVE STATEMENT:  Interpretor present.  Mild soreness in hip after last session. Doing well without cane. Hip is feeling good.    Eval: Patient has a long history of left lateral hip and low back pain.  She was found to have a glute med tear.  On 01/05/2024  she had a glute med repair as well as a left trochanteric bursectomy.  At this time she is having pain when she is standing and walking.  She is using 2 crutches.  PERTINENT HISTORY: Anxiety, depression  PAIN:  Are you having pain? Yes: NPRS scale: 0/10 right now  Pain location: left hip Pain description: aching  Aggravating factors: Standing/ walking/ night time  Relieving factors: pain medication   PRECAUTIONS: None  RED FLAGS: None   WEIGHT BEARING RESTRICTIONS: Yes WBAT   FALLS:  Has patient fallen in last 6 months? 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  DIAGNOSTIC FINDINGS:  Nothing post op   PATIENT SURVEYS:  LEFS  Extreme difficulty/unable (0), Quite a bit of difficulty (1), Moderate difficulty (2), Little difficulty (3), No difficulty (4) Survey date:    Any of your usual work, housework or school activities   2. Usual hobbies, recreational or sporting activities   3. Getting into/out of the bath   4. Walking between rooms  5. Putting on socks/shoes   6. Squatting    7. Lifting an object, like a bag of groceries from the floor   8. Performing light activities around your home   9. Performing heavy activities around your home   10. Getting into/out of a car   11. Walking 2 blocks   12. Walking 1 mile   13. Going up/down 10 stairs (1 flight)   14. Standing for 1 hour   15.  sitting for 1 hour   16. Running on even ground   17. Running on uneven ground   18. Making sharp turns while running fast   19. Hopping    20. Rolling over in bed   Score total:  1/80     COGNITION: Overall cognitive status: Within functional limits for tasks assessed     SENSATION: WFL  EDEMA:  No  noticeable edema  POSTURE: No Significant postural limitations  PALPATION: No unexpected tenderness to palpation   LOWER EXTREMITY ROM:  Passive ROM Right eval Left eval  Hip flexion  78  Hip extension    Hip abduction    Hip adduction    Hip internal rotation  5  Hip external rotation  Not performed 2nd to protocol   Knee flexion    Knee extension    Ankle dorsiflexion    Ankle plantarflexion    Ankle inversion    Ankle eversion     (Blank rows = not tested)  LOWER EXTREMITY MMT:  MMT Right eval Left eval  Hip flexion    Hip extension    Hip abduction    Hip adduction    Hip internal rotation    Hip external rotation    Knee flexion    Knee extension    Ankle dorsiflexion    Ankle plantarflexion    Ankle inversion    Ankle eversion     (Blank rows = not tested) Not tested 2nd to recent surgery    FUNCTIONAL TESTS:  Using hands to stand form a surface   GAIT: Using 2 crutches. Limited weight bearing at this time                                                                                                                               TREATMENT DATE:   6/26  Manual: Roller to proximal hip flexors, glutes, HS in supine and prone positions.   There-ex:  Nu-step x66min L3 PROM L hip in protocol ranges LAQ 2.5# lbs 2x15 5 hold Prone HSC x20 HSC RTB 2x15 Bridge 2x10  Standing HR/TR 2x15 Standing march 2x10 Partial squats 2x10 Standing HSC 2x15  6/24  Manual: Roller to proximal hip flexors, glutes, HS in supine and prone positions.   There-ex:  Nu-step x1min L3 PROM L hip in protocol ranges LAQ 2.5# lbs 2x10 5 hold Prone HSC x20 HSC RTB 2x10 Bridge 2x10  Standing HR/TR x20 Standing march 2x10   6/20  Manual: STM to proximal hip flexors   There-ex:  PROM L hip in protocol ranges SAQ with 2.5lbs 2x10 3 hold LAQ 2.5# lbs 2x10 5 hold HSC RTB 2x10 Bridge 2x10  PPT 2x10  Gait in hal without cane Standing HR/TR x20 Standing  HSC 2x10 Trialed standing hip extension but caused pain so stopped Nu-step x39min L3    PATIENT EDUCATION:  Education details: HEP, symptom management  Person educated: Patient Education method: Explanation, Demonstration, Tactile cues, Verbal cues, and Handouts Education comprehension: verbalized understanding, returned demonstration, verbal cues required, tactile cues required, and needs further education  HOME EXERCISE PROGRAM: Access Code: [email or web address] URL: [email or web address] Date: 01/09/2024 Prepared by: Alm Don  Exercises - Supine Heel Slide with Strap  - 3 x daily - 7 x weekly - 3 sets - 5 reps - 5 hold - Supine Quad Set  - 4-5 x daily - 7 x weekly - 3 sets - 10 reps - Seated Ankle Pumps  - 1 x daily - 6 x weekly - 3 sets - 10 reps - Supine Transversus Abdominis Bracing - Hands on Stomach  - 3-4 x daily - 7 x weekly - 3 sets - 10 reps  ASSESSMENT:  CLINICAL IMPRESSION: Pt 5 weeks s/p. She is progressing well thus far within protocol restrictions. She remains tender and tight into hip flexors, glutes and HS, so continued with IASTM using roller to affected areas. Pt report improvement following this. Good tolerance for standing therex and addition of partial squats. Will continue to progress as tolerated.   Eval: Patient is a 48 year old female status post glute med repair and left trochanteric bursa bursectomy.  She presents with expected limitations in motion, strength, and general functional mobility.  She is currently walking with 2 crutches.  She is having pain but it is improving.  She would benefit from skilled therapy to improve her ability to ambulate and perform daily tasks.  OBJECTIVE IMPAIRMENTS: Abnormal gait, decreased activity tolerance, decreased endurance, decreased mobility, difficulty walking, decreased ROM, decreased strength, increased edema, and pain.   ACTIVITY LIMITATIONS: carrying, lifting, bending, sitting, standing, squatting,  sleeping, stairs, transfers, bed mobility, bathing, hygiene/grooming, and locomotion level  PARTICIPATION LIMITATIONS: meal prep, cleaning, laundry, driving, shopping, community activity, and occupation  PERSONAL FACTORS: 1-2 comorbidities: anxiety are also affecting patient's functional outcome.   REHAB POTENTIAL: Good  CLINICAL DECISION MAKING: Evolving/moderate complexity Pain limiting general function   EVALUATION COMPLEXITY: Moderate   GOALS: Goals reviewed with patient? Yes  SHORT TERM GOALS: Target date: 02/06/2024   Patient will get in and out of bed independently without increased pain Baseline: Goal status: IN PROGRESS 6/11  2.  Patient will progress to ambulation without crutches as tolerated Baseline:  Goal status: MET (uses [facility] 6/11)  3.  Patient will demonstrate good glued contraction without increased pain Baseline:  Goal status: MET 6/11  4.  Patient will increase passive range of motion on the left into flexion to 90 degrees Baseline:  Goal status: MET 6/11  5.  Patient will be independent with basic HEP Baseline:  Goal status: MET 6/11   LONG TERM GOALS: Target date: 03/05/2024    Patient will go up and down 2 steps with reciprocal gait pattern in order to get in and out of her house Baseline:  Goal status: INITIAL  2.  Patient will ambulate community distances without increased pain Baseline:  Goal status: INITIAL  3.  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  Patient will have complete exercise program to promote further strengthening and general functional mobility Baseline:  Goal status: INITIAL L   PLAN:  PT FREQUENCY: 2x/week  PT DURATION: 8 weeks  PLANNED INTERVENTIONS: 97110-Therapeutic exercises, 97530- Therapeutic activity, [id number]- Neuromuscular re-education, 97535- Self Care, 02859- Manual therapy, [id number]- Gait  training, [phone]- Aquatic Therapy, 97014- Electrical stimulation (unattended), 97035- Ultrasound, Patient/Family education, Stair training, Taping, Dry Needling, DME instructions, Cryotherapy, and Moist heat   PLAN FOR NEXT SESSION: Continue with passive range of motion.  Follow glutes Meade repair protocol.  Progress gait training as tolerated.   [place] BRAVO Meshelle Holness, PTA 02/12/2024, 11:23 AM

## 2024-02-13 NOTE — Telephone Encounter (Signed)
 Noted

## 2024-02-18 ENCOUNTER — Ambulatory Visit (HOSPITAL_BASED_OUTPATIENT_CLINIC_OR_DEPARTMENT_OTHER): Payer: Self-pay | Attending: Orthopaedic Surgery

## 2024-02-18 ENCOUNTER — Encounter (HOSPITAL_BASED_OUTPATIENT_CLINIC_OR_DEPARTMENT_OTHER): Payer: Self-pay

## 2024-02-18 DIAGNOSIS — M6281 Muscle weakness (generalized): Secondary | ICD-10-CM | POA: Insufficient documentation

## 2024-02-18 DIAGNOSIS — M7062 Trochanteric bursitis, left hip: Secondary | ICD-10-CM | POA: Insufficient documentation

## 2024-02-18 DIAGNOSIS — M25552 Pain in left hip: Secondary | ICD-10-CM | POA: Insufficient documentation

## 2024-02-18 NOTE — Therapy (Signed)
 OUTPATIENT PHYSICAL THERAPY LOWER EXTREMITY treatment   Patient Name: Jennifer Noble MRN: [id number] DOB:March 08, 1976, 48 y.o., female Today's Date: 02/18/2024  END OF SESSION:  PT End of Session - 02/18/24 0857     Visit Number 9    Number of Visits 16    Date for PT Re-Evaluation 03/05/24    Authorization Type CAFA    PT Start Time 0856   Pt arrived late   PT Stop Time 0930    PT Time Calculation (min) 34 min    Activity Tolerance Patient tolerated treatment well    Behavior During Therapy [facility] for tasks assessed/performed                 Past Medical History:  Diagnosis Date   Anxiety    Depression    Dysuria    Hemorrhoid    High cholesterol    Past Surgical History:  Procedure Laterality Date   GLUTEUS MINIMUS REPAIR Left 01/05/2024   Procedure: REPAIR, TENDON, GLUTEUS MINIMUS;  Surgeon: Genelle Standing, MD;  Location: [facility];  Service: Orthopedics;  Laterality: Left;  LEFT GLUTEUS MEDIUS REPAIR AND POSSIBLE COLLAGEN PATCH AUGMENTATION   HEMORRHOID SURGERY     Patient Active Problem List   Diagnosis Date Noted   Tear of left gluteus minimus tendon 01/05/2024   Vasomotor symptoms due to menopause 12/03/2022   Headache 05/07/2016   Depression 08/17/2015   Insomnia 08/17/2015   Elevated lipids 04/08/2013   Family history of early CAD 04/08/2013   Hemorrhoid    Dysuria     PCP: Haze Arlington NP  REFERRING PROVIDER: DR Elia Genelle   REFERRING DIAG:  Left glut repair   THERAPY DIAG:  Pain in left hip  Muscle weakness (generalized)  Trochanteric bursitis of left hip  Rationale for Evaluation and Treatment: Rehabilitation  ONSET DATE: DOS 01/05/2024  SUBJECTIVE:   SUBJECTIVE STATEMENT:  Interpretor present.  Pt reports no c/o pain in hip at entry. Arrives late due to traffic.   Eval: Patient has a long history of left lateral hip and low back pain.  She was found to have a glute med tear.  On 01/05/2024 she  had a glute med repair as well as a left trochanteric bursectomy.  At this time she is having pain when she is standing and walking.  She is using 2 crutches.  PERTINENT HISTORY: Anxiety, depression  PAIN:  Are you having pain? Yes: NPRS scale: 0/10 right now  Pain location: left hip Pain description: aching  Aggravating factors: Standing/ walking/ night time  Relieving factors: pain medication   PRECAUTIONS: None  RED FLAGS: None   WEIGHT BEARING RESTRICTIONS: Yes WBAT   FALLS:  Has patient fallen in last 6 months? 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  DIAGNOSTIC FINDINGS:  Nothing post op   PATIENT SURVEYS:  LEFS  Extreme difficulty/unable (0), Quite a bit of difficulty (1), Moderate difficulty (2), Little difficulty (3), No difficulty (4) Survey date:    Any of your usual work, housework or school activities   2. Usual hobbies, recreational or sporting activities   3. Getting into/out of the bath   4. Walking between rooms  5. Putting on socks/shoes   6. Squatting    7. Lifting an object, like a bag of groceries from the floor   8. Performing light activities around your home   9. Performing heavy activities around your home   10. Getting into/out of a car   11. Walking 2 blocks   12. Walking 1 mile   13. Going up/down 10 stairs (1 flight)   14. Standing for 1 hour   15.  sitting for 1 hour   16. Running on even ground   17. Running on uneven ground   18. Making sharp turns while running fast   19. Hopping    20. Rolling over in bed   Score total:  1/80     COGNITION: Overall cognitive status: Within functional limits for tasks assessed     SENSATION: WFL  EDEMA:  No  noticeable edema  POSTURE: No Significant postural limitations  PALPATION: No unexpected tenderness to palpation   LOWER EXTREMITY ROM:  Passive ROM Right eval Left eval  Hip flexion  78  Hip extension    Hip abduction    Hip adduction    Hip internal rotation  5  Hip external rotation  Not performed 2nd to protocol   Knee flexion    Knee extension    Ankle dorsiflexion    Ankle plantarflexion    Ankle inversion    Ankle eversion     (Blank rows = not tested)  LOWER EXTREMITY MMT:  MMT Right eval Left eval  Hip flexion    Hip extension    Hip abduction    Hip adduction    Hip internal rotation    Hip external rotation    Knee flexion    Knee extension    Ankle dorsiflexion    Ankle plantarflexion    Ankle inversion    Ankle eversion     (Blank rows = not tested) Not tested 2nd to recent surgery    FUNCTIONAL TESTS:  Using hands to stand form a surface   GAIT: Using 2 crutches. Limited weight bearing at this time                                                                                                                               TREATMENT DATE:    7/2  Manual: Roller to proximal hip flexors, glutes, HS in supine and prone positions.   There-ex:  Nu-step x46min L4 PROM L hip LAQ 2.5# lbs 2x15 5 hold Bridge 2x15 Hooklying hip abduction isometric with gait belt 5 2x10 Standing HR/TR 2x15 Standing march 2x10 Partial squats 2x10 Standing HSC 2x15  TherAct: Squats 2x10 Step ups 4 2x10 fwd and lateral   6/26  Manual: Roller to proximal hip flexors, glutes, HS in supine and prone positions.   There-ex:  Nu-step x13min L3 PROM L hip in protocol ranges LAQ 2.5# lbs 2x15 5 hold Prone HSC x20 HSC RTB 2x15  Bridge 2x10  Standing HR/TR 2x15 Standing march 2x10 Partial squats 2x10 Standing HSC 2x15  TherAct: Squats 2x10  6/24  Manual: Roller to proximal hip flexors, glutes, HS in supine and prone positions.   There-ex:   Nu-step x75min L3 PROM L hip in protocol ranges LAQ 2.5# lbs 2x10 5 hold Prone HSC x20 HSC RTB 2x10 Bridge 2x10  Standing HR/TR x20 Standing march 2x10   6/20  Manual: STM to proximal hip flexors   There-ex:  PROM L hip in protocol ranges SAQ with 2.5lbs 2x10 3 hold LAQ 2.5# lbs 2x10 5 hold HSC RTB 2x10 Bridge 2x10  PPT 2x10  Gait in hal without cane Standing HR/TR x20 Standing HSC 2x10 Trialed standing hip extension but caused pain so stopped Nu-step x28min L3    PATIENT EDUCATION:  Education details: HEP, symptom management  Person educated: Patient Education method: Explanation, Demonstration, Tactile cues, Verbal cues, and Handouts Education comprehension: verbalized understanding, returned demonstration, verbal cues required, tactile cues required, and needs further education  HOME EXERCISE PROGRAM: Access Code: [email or web address] URL: [email or web address] Date: 01/09/2024 Prepared by: Alm Don  Exercises - Supine Heel Slide with Strap  - 3 x daily - 7 x weekly - 3 sets - 5 reps - 5 hold - Supine Quad Set  - 4-5 x daily - 7 x weekly - 3 sets - 10 reps - Seated Ankle Pumps  - 1 x daily - 6 x weekly - 3 sets - 10 reps - Supine Transversus Abdominis Bracing - Hands on Stomach  - 3-4 x daily - 7 x weekly - 3 sets - 10 reps  ASSESSMENT:  CLINICAL IMPRESSION: Pt able to progress with closed chain strengthening without c/o pain, only muscular fatigue. Initiated hip abduction isometrics today without onset of pain. Pt progressing very well. Will continue to progress strengthening as tolerated per protocol.   Eval: Patient is a 48 year old female status post glute med repair and left trochanteric bursa bursectomy.  She presents with expected limitations in motion, strength, and general functional mobility.  She is currently walking with 2 crutches.  She is having pain but it is improving.  She would benefit from skilled therapy to improve her ability to  ambulate and perform daily tasks.  OBJECTIVE IMPAIRMENTS: Abnormal gait, decreased activity tolerance, decreased endurance, decreased mobility, difficulty walking, decreased ROM, decreased strength, increased edema, and pain.   ACTIVITY LIMITATIONS: carrying, lifting, bending, sitting, standing, squatting, sleeping, stairs, transfers, bed mobility, bathing, hygiene/grooming, and locomotion level  PARTICIPATION LIMITATIONS: meal prep, cleaning, laundry, driving, shopping, community activity, and occupation  PERSONAL FACTORS: 1-2 comorbidities: anxiety are also affecting patient's functional outcome.   REHAB POTENTIAL: Good  CLINICAL DECISION MAKING: Evolving/moderate complexity Pain limiting general function   EVALUATION COMPLEXITY: Moderate   GOALS: Goals reviewed with patient? Yes  SHORT TERM GOALS: Target date: 02/06/2024   Patient will get in and out of bed independently without increased pain Baseline: Goal status: IN PROGRESS 6/11  2.  Patient will progress to ambulation without crutches as tolerated Baseline:  Goal status: MET (uses [facility] 6/11)  3.  Patient will demonstrate good glued contraction without increased pain Baseline:  Goal status: MET 6/11  4.  Patient will increase passive range of motion on the left into flexion to 90 degrees Baseline:  Goal status: MET 6/11  5.  Patient will be independent with basic HEP Baseline:  Goal status: MET 6/11   LONG TERM GOALS: Target date: 03/05/2024    Patient will go up  and down 2 steps with reciprocal gait pattern in order to get in and out of her house Baseline:  Goal status: INITIAL  2.  Patient will ambulate community distances without increased pain Baseline:  Goal status: INITIAL  3.  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  Patient will have complete exercise program to promote further  strengthening and general functional mobility Baseline:  Goal status: INITIAL L   PLAN:  PT FREQUENCY: 2x/week  PT DURATION: 8 weeks  PLANNED INTERVENTIONS: 97110-Therapeutic exercises, 97530- Therapeutic activity, [id number]- Neuromuscular re-education, 97535- Self Care, [id number]- Manual therapy, [id number]- Gait training, [phone]- Aquatic Therapy, 97014- Electrical stimulation (unattended), 97035- Ultrasound, Patient/Family education, Stair training, Taping, Dry Needling, DME instructions, Cryotherapy, and Moist heat   PLAN FOR NEXT SESSION: Continue with passive range of motion.  Follow glutes Meade repair protocol.  Progress gait training as tolerated.   Asberry BRAVO Zimri Brennen, PTA 02/18/2024, 10:33 AM

## 2024-02-23 ENCOUNTER — Ambulatory Visit (HOSPITAL_BASED_OUTPATIENT_CLINIC_OR_DEPARTMENT_OTHER): Payer: Self-pay

## 2024-02-23 DIAGNOSIS — M7062 Trochanteric bursitis, left hip: Secondary | ICD-10-CM

## 2024-02-23 DIAGNOSIS — M6281 Muscle weakness (generalized): Secondary | ICD-10-CM

## 2024-02-23 DIAGNOSIS — M25552 Pain in left hip: Secondary | ICD-10-CM

## 2024-02-23 NOTE — Therapy (Signed)
 OUTPATIENT PHYSICAL THERAPY LOWER EXTREMITY treatment   Patient Name: Jennifer Noble MRN: [id number] DOB:12-Apr-1976, 48 y.o., female Today's Date: 02/23/2024  END OF SESSION:  PT End of Session - 02/23/24 0756     Visit Number 10    Number of Visits 16    Date for PT Re-Evaluation 03/05/24    Authorization Type CAFA    PT Start Time 0802    PT Stop Time 0846    PT Time Calculation (min) 44 min    Activity Tolerance Patient tolerated treatment well    Behavior During Therapy [facility] for tasks assessed/performed                  Past Medical History:  Diagnosis Date   Anxiety    Depression    Dysuria    Hemorrhoid    High cholesterol    Past Surgical History:  Procedure Laterality Date   GLUTEUS MINIMUS REPAIR Left 01/05/2024   Procedure: REPAIR, TENDON, GLUTEUS MINIMUS;  Surgeon: Genelle Standing, MD;  Location: [facility];  Service: Orthopedics;  Laterality: Left;  LEFT GLUTEUS MEDIUS REPAIR AND POSSIBLE COLLAGEN PATCH AUGMENTATION   HEMORRHOID SURGERY     Patient Active Problem List   Diagnosis Date Noted   Tear of left gluteus minimus tendon 01/05/2024   Vasomotor symptoms due to menopause 12/03/2022   Headache 05/07/2016   Depression 08/17/2015   Insomnia 08/17/2015   Elevated lipids 04/08/2013   Family history of early CAD 04/08/2013   Hemorrhoid    Dysuria     PCP: Haze Arlington NP  REFERRING PROVIDER: DR Elia Genelle   REFERRING DIAG:  Left glut repair   THERAPY DIAG:  Pain in left hip  Muscle weakness (generalized)  Trochanteric bursitis of left hip  Rationale for Evaluation and Treatment: Rehabilitation  ONSET DATE: DOS 01/05/2024  SUBJECTIVE:   SUBJECTIVE STATEMENT:  Interpretor present.  Pt denies pain at entry.    Eval: Patient has a long history of left lateral hip and low back pain.  She was found to have a glute med tear.  On 01/05/2024 she had a glute med repair as well as a left trochanteric  bursectomy.  At this time she is having pain when she is standing and walking.  She is using 2 crutches.  PERTINENT HISTORY: Anxiety, depression  PAIN:  Are you having pain? Yes: NPRS scale: 0/10 right now  Pain location: left hip Pain description: aching  Aggravating factors: Standing/ walking/ night time  Relieving factors: pain medication   PRECAUTIONS: None  RED FLAGS: None   WEIGHT BEARING RESTRICTIONS: Yes WBAT   FALLS:  Has patient fallen in last 6 months? 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  DIAGNOSTIC FINDINGS:  Nothing post op   PATIENT SURVEYS:  LEFS  Extreme difficulty/unable (0), Quite a bit of difficulty (1), Moderate difficulty (2), Little difficulty (3), No difficulty (4) Survey date:    Any of your usual work, housework or school activities   2. Usual hobbies, recreational or sporting activities   3. Getting into/out of the bath   4. Walking between rooms   5. Putting on socks/shoes   6. Squatting  7. Lifting an object, like a bag of groceries from the floor   8. Performing light activities around your home   9. Performing heavy activities around your home   10. Getting into/out of a car   11. Walking 2 blocks   12. Walking 1 mile   13. Going up/down 10 stairs (1 flight)   14. Standing for 1 hour   15.  sitting for 1 hour   16. Running on even ground   17. Running on uneven ground   18. Making sharp turns while running fast   19. Hopping    20. Rolling over in bed   Score total:  1/80     COGNITION: Overall cognitive status: Within functional limits for tasks assessed     SENSATION: WFL  EDEMA:  No noticeable edema  POSTURE: No Significant postural  limitations  PALPATION: No unexpected tenderness to palpation   LOWER EXTREMITY ROM:  Passive ROM Right eval Left eval  Hip flexion  78  Hip extension    Hip abduction    Hip adduction    Hip internal rotation  5  Hip external rotation  Not performed 2nd to protocol   Knee flexion    Knee extension    Ankle dorsiflexion    Ankle plantarflexion    Ankle inversion    Ankle eversion     (Blank rows = not tested)  LOWER EXTREMITY MMT:  MMT Right eval Left eval  Hip flexion    Hip extension    Hip abduction    Hip adduction    Hip internal rotation    Hip external rotation    Knee flexion    Knee extension    Ankle dorsiflexion    Ankle plantarflexion    Ankle inversion    Ankle eversion     (Blank rows = not tested) Not tested 2nd to recent surgery    FUNCTIONAL TESTS:  Using hands to stand form a surface   GAIT: Using 2 crutches. Limited weight bearing at this time                                                                                                                               TREATMENT DATE:   7/7  Manual: Roller to HS/calf in prone position at end of sesison  There-ex:  Nu-step x7min L3 PROM L hip LAQ 3# lbs 2x15 5 hold Seated HSC GTB 2x10 Bridge 2x15 Hooklying hip abduction isometric with gait belt 5 2x10 Standing HR/TR 2x15 Standing march 2x10  TherAct: Squats 2x10 Standing marches 2x10ea Step ups 6 2x10 fwd    7/2  Manual: Roller to proximal hip flexors, glutes, HS in supine and prone positions.   There-ex:  Nu-step x16min L4 PROM L hip LAQ 2.5# lbs 2x15 5 hold Bridge 2x15 Hooklying hip abduction isometric with gait belt 5 2x10 Standing HR/TR 2x15 Standing march 2x10 Partial squats 2x10 Standing HSC 2x15  TherAct: Squats 2x10 Step ups 4 2x10 fwd and lateral   6/26  Manual: Roller to proximal hip flexors, glutes, HS in supine and prone positions.   There-ex:  Nu-step x84min L3 PROM L hip in protocol  ranges LAQ 2.5# lbs 2x15 5 hold Prone HSC x20 HSC RTB 2x15 Bridge 2x10  Standing HR/TR 2x15 Standing march 2x10 Partial squats 2x10 Standing HSC 2x15  TherAct: Squats 2x10   PATIENT EDUCATION:  Education details: HEP, symptom management  Person educated: Patient Education method: Explanation, Demonstration, Tactile cues, Verbal cues, and Handouts Education comprehension: verbalized understanding, returned demonstration, verbal cues required, tactile cues required, and needs further education  HOME EXERCISE PROGRAM: Access Code: [email or web address] URL: [email or web address] Date: 01/09/2024 Prepared by: Alm Don  Exercises - Supine Heel Slide with Strap  - 3 x daily - 7 x weekly - 3 sets - 5 reps - 5 hold - Supine Quad Set  - 4-5 x daily - 7 x weekly - 3 sets - 10 reps - Seated Ankle Pumps  - 1 x daily - 6 x weekly - 3 sets - 10 reps - Supine Transversus Abdominis Bracing - Hands on Stomach  - 3-4 x daily - 7 x weekly - 3 sets - 10 reps  ASSESSMENT:  CLINICAL IMPRESSION: Pt now 7 weeks s/p. Continued to progress closed chain strengthening which she tolerates well. No complaints with isometric abduction strengthening. Progressed step up height to 6 today without pain, though she did c/o muscle soreness/fatigue. Cuing required for proper performance with exercises. Will continue to progress as tolerated with protocol.   Eval: Patient is a 48 year old female status post glute med repair and left trochanteric bursa bursectomy.  She presents with expected limitations in motion, strength, and general functional mobility.  She is currently walking with 2 crutches.  She is having pain but it is improving.  She would benefit from skilled therapy to improve her ability to ambulate and perform daily tasks.  OBJECTIVE IMPAIRMENTS: Abnormal gait, decreased activity tolerance, decreased endurance, decreased mobility, difficulty walking, decreased ROM, decreased strength, increased  edema, and pain.   ACTIVITY LIMITATIONS: carrying, lifting, bending, sitting, standing, squatting, sleeping, stairs, transfers, bed mobility, bathing, hygiene/grooming, and locomotion level  PARTICIPATION LIMITATIONS: meal prep, cleaning, laundry, driving, shopping, community activity, and occupation  PERSONAL FACTORS: 1-2 comorbidities: anxiety are also affecting patient's functional outcome.   REHAB POTENTIAL: Good  CLINICAL DECISION MAKING: Evolving/moderate complexity Pain limiting general function   EVALUATION COMPLEXITY: Moderate   GOALS: Goals reviewed with patient? Yes  SHORT TERM GOALS: Target date: 02/06/2024   Patient will get in and out of bed independently without increased pain Baseline: Goal status: IN PROGRESS 6/11  2.  Patient will progress to ambulation without crutches as tolerated Baseline:  Goal status: MET (uses [facility] 6/11)  3.  Patient will demonstrate good glued contraction without increased pain Baseline:  Goal status: MET 6/11  4.  Patient will increase passive range of motion on the left into flexion to 90 degrees Baseline:  Goal status: MET 6/11  5.  Patient will be independent with basic HEP Baseline:  Goal status: MET 6/11   LONG TERM GOALS: Target date: 03/05/2024    Patient will go up and down 2 steps with reciprocal gait pattern in order to get in and out of her house Baseline:  Goal status: INITIAL  2.  Patient will ambulate community distances without increased pain Baseline:  Goal status: INITIAL  3.  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  Patient will have complete exercise program to promote further strengthening and general functional mobility Baseline:  Goal status: INITIAL L   PLAN:  PT FREQUENCY: 2x/week  PT DURATION: 8 weeks  PLANNED INTERVENTIONS: 97110-Therapeutic exercises, 97530- Therapeutic  activity, [id number]- Neuromuscular re-education, 97535- Self Care, 02859- Manual therapy, [id number]- Gait training, [phone]- Aquatic Therapy, 97014- Electrical stimulation (unattended), 97035- Ultrasound, Patient/Family education, Stair training, Taping, Dry Needling, DME instructions, Cryotherapy, and Moist heat   PLAN FOR NEXT SESSION: Continue with passive range of motion.  Follow glutes Meade repair protocol.  Progress gait training as tolerated.   [place] BRAVO Coralee Edberg, PTA 02/23/2024, 9:26 AM

## 2024-02-27 ENCOUNTER — Ambulatory Visit (HOSPITAL_BASED_OUTPATIENT_CLINIC_OR_DEPARTMENT_OTHER): Payer: Self-pay | Admitting: Physical Therapy

## 2024-03-02 ENCOUNTER — Encounter (HOSPITAL_BASED_OUTPATIENT_CLINIC_OR_DEPARTMENT_OTHER): Payer: Self-pay | Admitting: Physical Therapy

## 2024-03-05 ENCOUNTER — Encounter (HOSPITAL_BASED_OUTPATIENT_CLINIC_OR_DEPARTMENT_OTHER): Payer: Self-pay

## 2024-03-09 ENCOUNTER — Ambulatory Visit (HOSPITAL_BASED_OUTPATIENT_CLINIC_OR_DEPARTMENT_OTHER): Payer: Self-pay | Admitting: Physical Therapy

## 2024-03-09 ENCOUNTER — Other Ambulatory Visit: Payer: Self-pay

## 2024-03-09 NOTE — Therapy (Deleted)
 OUTPATIENT PHYSICAL THERAPY LOWER EXTREMITY treatment   Patient Name: Ileene Kaleyah Labreck MRN: [id number] DOB:1976/03/09, 48 y.o., female Today's Date: 03/09/2024  END OF SESSION:            Past Medical History:  Diagnosis Date   Anxiety    Depression    Dysuria    Hemorrhoid    High cholesterol    Past Surgical History:  Procedure Laterality Date   GLUTEUS MINIMUS REPAIR Left 01/05/2024   Procedure: REPAIR, TENDON, GLUTEUS MINIMUS;  Surgeon: Genelle Standing, MD;  Location: [facility];  Service: Orthopedics;  Laterality: Left;  LEFT GLUTEUS MEDIUS REPAIR AND POSSIBLE COLLAGEN PATCH AUGMENTATION   HEMORRHOID SURGERY     Patient Active Problem List   Diagnosis Date Noted   Tear of left gluteus minimus tendon 01/05/2024   Vasomotor symptoms due to menopause 12/03/2022   Headache 05/07/2016   Depression 08/17/2015   Insomnia 08/17/2015   Elevated lipids 04/08/2013   Family history of early CAD 04/08/2013   Hemorrhoid    Dysuria     PCP: Haze Arlington NP  REFERRING PROVIDER: DR Elia Genelle   REFERRING DIAG:  Left glut repair   THERAPY DIAG:  No diagnosis found.  Rationale for Evaluation and Treatment: Rehabilitation  ONSET DATE: DOS 01/05/2024  SUBJECTIVE:   SUBJECTIVE STATEMENT:  Interpretor present.  Pt denies pain at entry.    Eval: Patient has a long history of left lateral hip and low back pain.  She was found to have a glute med tear.  On 01/05/2024 she had a glute med repair as well as a left trochanteric bursectomy.  At this time she is having pain when she is standing and walking.  She is using 2 crutches.  PERTINENT HISTORY: Anxiety, depression  PAIN:  Are you having pain? Yes: NPRS scale: 0/10 right now  Pain location: left hip Pain description: aching  Aggravating factors: Standing/ walking/ night time  Relieving factors: pain medication   PRECAUTIONS: None  RED FLAGS: None   WEIGHT BEARING  RESTRICTIONS: Yes WBAT   FALLS:  Has patient fallen in last 6 months? 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  DIAGNOSTIC FINDINGS:  Nothing post op   PATIENT SURVEYS:  LEFS  Extreme difficulty/unable (0), Quite a bit of difficulty (1), Moderate difficulty (2), Little difficulty (3), No difficulty (4) Survey date:    Any of your usual work, housework or school activities   2. Usual hobbies, recreational or sporting activities   3. Getting into/out of the bath   4. Walking between rooms   5. Putting on socks/shoes   6. Squatting    7. Lifting an object, like a bag of groceries from the floor   8. Performing light activities around your home   9. Performing heavy activities around your home   10. Getting into/out of a car   11. Walking 2 blocks   12. Walking 1 mile   13. Going up/down 10 stairs (1 flight)   14. Standing for 1 hour   15.  sitting for 1 hour   16. Running on even ground   17. Running on uneven ground   18. Making sharp  turns while running fast   19. Hopping    20. Rolling over in bed   Score total:  1/80     COGNITION: Overall cognitive status: Within functional limits for tasks assessed     SENSATION: WFL  EDEMA:  No noticeable edema  POSTURE: No Significant postural limitations  PALPATION: No unexpected tenderness to palpation   LOWER EXTREMITY ROM:  Passive ROM Right eval Left eval  Hip flexion  78  Hip extension    Hip abduction    Hip adduction    Hip internal rotation  5  Hip external rotation  Not performed 2nd to protocol   Knee flexion    Knee extension    Ankle dorsiflexion    Ankle plantarflexion    Ankle inversion    Ankle eversion     (Blank rows  = not tested)  LOWER EXTREMITY MMT:  MMT Right eval Left eval  Hip flexion    Hip extension    Hip abduction    Hip adduction    Hip internal rotation    Hip external rotation    Knee flexion    Knee extension    Ankle dorsiflexion    Ankle plantarflexion    Ankle inversion    Ankle eversion     (Blank rows = not tested) Not tested 2nd to recent surgery    FUNCTIONAL TESTS:  Using hands to stand form a surface   GAIT: Using 2 crutches. Limited weight bearing at this time                                                                                                                               TREATMENT DATE:   7/7  Manual: Roller to HS/calf in prone position at end of sesison  There-ex:  Nu-step x61min L3 PROM L hip LAQ 3# lbs 2x15 5 hold Seated HSC GTB 2x10 Bridge 2x15 Hooklying hip abduction isometric with gait belt 5 2x10 Standing HR/TR 2x15 Standing march 2x10  TherAct: Squats 2x10 Standing marches 2x10ea Step ups 6 2x10 fwd    7/2  Manual: Roller to proximal hip flexors, glutes, HS in supine and prone positions.   There-ex:  Nu-step x11min L4 PROM L hip LAQ 2.5# lbs 2x15 5 hold Bridge 2x15 Hooklying hip abduction isometric with gait belt 5 2x10 Standing HR/TR 2x15 Standing march 2x10 Partial squats 2x10 Standing HSC 2x15  TherAct: Squats 2x10 Step ups 4 2x10 fwd and lateral   6/26  Manual: Roller to proximal hip flexors, glutes, HS in supine and prone positions.   There-ex:  Nu-step x20min L3 PROM L hip in protocol ranges LAQ 2.5# lbs 2x15 5 hold Prone HSC x20 HSC RTB 2x15 Bridge 2x10  Standing HR/TR 2x15 Standing march 2x10 Partial squats 2x10 Standing HSC 2x15  TherAct: Squats 2x10   PATIENT EDUCATION:  Education details: HEP, symptom management  Person educated: Patient Education method: Explanation, Demonstration, Tactile cues, Verbal cues,  and Handouts Education comprehension: verbalized understanding,  returned demonstration, verbal cues required, tactile cues required, and needs further education  HOME EXERCISE PROGRAM: Access Code: [email or web address] URL: [email or web address] Date: 01/09/2024 Prepared by: Alm Don  Exercises - Supine Heel Slide with Strap  - 3 x daily - 7 x weekly - 3 sets - 5 reps - 5 hold - Supine Quad Set  - 4-5 x daily - 7 x weekly - 3 sets - 10 reps - Seated Ankle Pumps  - 1 x daily - 6 x weekly - 3 sets - 10 reps - Supine Transversus Abdominis Bracing - Hands on Stomach  - 3-4 x daily - 7 x weekly - 3 sets - 10 reps  ASSESSMENT:  CLINICAL IMPRESSION: Pt now 7 weeks s/p. Continued to progress closed chain strengthening which she tolerates well. No complaints with isometric abduction strengthening. Progressed step up height to 6 today without pain, though she did c/o muscle soreness/fatigue. Cuing required for proper performance with exercises. Will continue to progress as tolerated with protocol.   Eval: Patient is a 48 year old female status post glute med repair and left trochanteric bursa bursectomy.  She presents with expected limitations in motion, strength, and general functional mobility.  She is currently walking with 2 crutches.  She is having pain but it is improving.  She would benefit from skilled therapy to improve her ability to ambulate and perform daily tasks.  OBJECTIVE IMPAIRMENTS: Abnormal gait, decreased activity tolerance, decreased endurance, decreased mobility, difficulty walking, decreased ROM, decreased strength, increased edema, and pain.   ACTIVITY LIMITATIONS: carrying, lifting, bending, sitting, standing, squatting, sleeping, stairs, transfers, bed mobility, bathing, hygiene/grooming, and locomotion level  PARTICIPATION LIMITATIONS: meal prep, cleaning, laundry, driving, shopping, community activity, and occupation  PERSONAL FACTORS: 1-2 comorbidities: anxiety are also affecting patient's functional outcome.   REHAB  POTENTIAL: Good  CLINICAL DECISION MAKING: Evolving/moderate complexity Pain limiting general function   EVALUATION COMPLEXITY: Moderate   GOALS: Goals reviewed with patient? Yes  SHORT TERM GOALS: Target date: 02/06/2024   Patient will get in and out of bed independently without increased pain Baseline: Goal status: IN PROGRESS 6/11  2.  Patient will progress to ambulation without crutches as tolerated Baseline:  Goal status: MET (uses [facility] 6/11)  3.  Patient will demonstrate good glued contraction without increased pain Baseline:  Goal status: MET 6/11  4.  Patient will increase passive range of motion on the left into flexion to 90 degrees Baseline:  Goal status: MET 6/11  5.  Patient will be independent with basic HEP Baseline:  Goal status: MET 6/11   LONG TERM GOALS: Target date: 03/05/2024    Patient will go up and down 2 steps with reciprocal gait pattern in order to get in and out of her house Baseline:  Goal status: INITIAL  2.  Patient will ambulate community distances without increased pain Baseline:  Goal status: INITIAL  3.  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  Patient will have complete exercise program to promote further strengthening and general functional mobility Baseline:  Goal status: INITIAL L   PLAN:  PT FREQUENCY: 2x/week  PT DURATION: 8 weeks  PLANNED INTERVENTIONS: 97110-Therapeutic exercises, 97530- Therapeutic activity, [id number]- Neuromuscular re-education, 97535- Self Care, [id number]- Manual therapy, [id number]- Gait training, [phone]- Aquatic Therapy, 97014- Electrical stimulation (unattended), [phone]- Ultrasound, Patient/Family education, Stair training, Taping, Dry Needling, DME instructions, Cryotherapy, and  Moist heat   PLAN FOR NEXT SESSION: Continue with passive range of motion.  Follow glutes Meade repair protocol.   Progress gait training as tolerated.   Rojean JONELLE Batten, PT 03/09/2024, 2:47 PM

## 2024-03-11 ENCOUNTER — Ambulatory Visit (HOSPITAL_BASED_OUTPATIENT_CLINIC_OR_DEPARTMENT_OTHER): Payer: Self-pay | Admitting: Physical Therapy

## 2024-03-16 ENCOUNTER — Ambulatory Visit (HOSPITAL_BASED_OUTPATIENT_CLINIC_OR_DEPARTMENT_OTHER): Payer: Self-pay

## 2024-03-18 ENCOUNTER — Ambulatory Visit (INDEPENDENT_AMBULATORY_CARE_PROVIDER_SITE_OTHER): Payer: Self-pay | Admitting: Orthopaedic Surgery

## 2024-03-18 DIAGNOSIS — S76012D Strain of muscle, fascia and tendon of left hip, subsequent encounter: Secondary | ICD-10-CM

## 2024-03-18 MED ORDER — LIDOCAINE HCL 1 % IJ SOLN
4.0000 mL | INTRAMUSCULAR | Status: AC | PRN
  Administered 2024-03-18: 4 mL

## 2024-03-18 MED ORDER — TRIAMCINOLONE ACETONIDE 40 MG/ML IJ SUSP
80.0000 mg | INTRAMUSCULAR | Status: AC | PRN
  Administered 2024-03-18: 80 mg via INTRA_ARTICULAR

## 2024-03-18 NOTE — Progress Notes (Signed)
 Post Operative Evaluation    Procedure/Date of Surgery: Left hip gluteus medius repair 5/19  Interval History:   Presents 12 weeks status post above procedure.  Overall she is doing well.  She does occasionally have some muscular tightness about the lateral aspect of the hip.  Overall she is continuing to improve nicely  PMH/PSH/Family History/Social History/Meds/Allergies:    Past Medical History:  Diagnosis Date  . Anxiety   . Depression   . Dysuria   . Hemorrhoid   . High cholesterol    Past Surgical History:  Procedure Laterality Date  . GLUTEUS MINIMUS REPAIR Left 01/05/2024   Procedure: REPAIR, TENDON, GLUTEUS MINIMUS;  Surgeon: Genelle Standing, MD;  [facility];  Service: Orthopedics;  Laterality: Left;  LEFT GLUTEUS MEDIUS REPAIR AND POSSIBLE COLLAGEN PATCH AUGMENTATION  . HEMORRHOID SURGERY     Social History   Socioeconomic History  . Marital status: Married    Spouse name: Not on file  . Number of children: 3  . Years of education: Not on file  . Highest education level: 6th grade  Occupational History  . Not on file  Tobacco Use  . Smoking status: Never  . Smokeless tobacco: Never  Vaping Use  . Vaping status: Never Used  Substance and Sexual Activity  . Alcohol use: No  . Drug use: No  . Sexual activity: Yes    Birth control/protection: Post-menopausal  Other Topics Concern  . Not on file  Social History Narrative  . Not on file   Social Drivers of Health   Financial Resource Strain: Not on file  Food Insecurity: Food Insecurity Present (12/03/2022)   Hunger Vital Sign   . Worried About Programme researcher, broadcasting/film/video in the Last Year: Sometimes true   . Ran Out of Food in the Last Year: Sometimes true  Transportation Needs: No Transportation Needs (12/03/2022)   PRAPARE - Transportation   . Lack of Transportation (Medical): No   . Lack of Transportation (Non-Medical): No  Physical Activity: Not on  file  Stress: Not on file  Social Connections: Not on file   Family History  Problem Relation Age of Onset  . Diabetes Mother   . Diabetes Sister   . Diabetes Brother   . Heart disease Other   . Breast cancer Neg Hx    Allergies  Allergen Reactions  . Naproxen  Nausea And Vomiting   Current Outpatient Medications  Medication Sig Dispense Refill  . aspirin  EC 325 MG tablet Take 1 tablet (325 mg total) by mouth daily. 14 tablet 0  . citalopram  (CELEXA ) 20 MG tablet Take 1 tablet (20 mg total) by mouth daily. 90 tablet 3  . gabapentin  (NEURONTIN ) 300 MG capsule Take 1 capsule (300 mg total) by mouth 2 (two) times daily. (Patient not taking: Reported on 01/09/2024) 60 capsule 1  . nitroGLYCERIN  (NITRODUR - DOSED IN MG/24 HR) 0.2 mg/hr patch Cut patch into fourths. Place 1/4 patch over affected area on hip. Change every 24 hours. (Patient not taking: Reported on 12/29/2023) 30 patch 0  . oxyCODONE  (ROXICODONE ) 5 MG immediate release tablet Take 1 tablet (5 mg total) by mouth every 4 (four) hours as needed for severe pain (pain score 7-10) or breakthrough pain. 15 tablet 0   No current facility-administered medications for this visit.  No results found.  Review of Systems:   A ROS was performed including pertinent positives and negatives as documented in the HPI.   Musculoskeletal Exam:    Left hip with little to no pain.  She has a well-appearing incision without erythema or drainage.  Distal neurosensory exam is intact.  Abduction strength deferred today internal/external rotation of the left hip is to 30 degrees  Imaging:      I personally reviewed and interpreted the radiographs.   Assessment:   12 weeks status post left hip gluteus medius tendon repair overall doing treatment well.  At this time she is having some tenderness about the lateral aspect of the hip and some muscular soreness.  Overall she is continuing to improve nicely.  She has requested an additional left  lateral ultrasound-guided injection to hopefully get her completely pain-free as she returns to work  Plan :    - Return to clinic as needed    Procedure Note  Patient: Jennifer Noble             Date of Birth: Aug 12, 1976           MRN: [id number]             Visit Date: 03/18/2024  Procedures: Visit Diagnoses: No diagnosis found.  Large Joint Inj: L greater trochanter on 03/18/2024 12:11 PM Indications: pain Details: 22 G 3.5 in needle, ultrasound-guided anterolateral approach  Arthrogram: No  Medications: 4 mL lidocaine  1 %; 80 mg triamcinolone  acetonide 40 MG/ML Outcome: tolerated well, no immediate complications Procedure, treatment alternatives, risks and benefits explained, specific risks discussed. Consent was given by the patient. Immediately prior to procedure a time out was called to verify the correct patient, procedure, equipment, support staff and site/side marked as required. Patient was prepped and draped in the usual sterile fashion.            I personally saw and evaluated the patient, and participated in the management and treatment plan.  Elspeth Parker, MD Attending Physician, Orthopedic Surgery  This document was dictated using Dragon voice recognition software. A reasonable attempt at proof reading has been made to minimize errors.

## 2024-03-19 ENCOUNTER — Ambulatory Visit (HOSPITAL_BASED_OUTPATIENT_CLINIC_OR_DEPARTMENT_OTHER): Payer: Self-pay | Attending: Orthopaedic Surgery | Admitting: Physical Therapy

## 2024-03-19 ENCOUNTER — Encounter (HOSPITAL_BASED_OUTPATIENT_CLINIC_OR_DEPARTMENT_OTHER): Payer: Self-pay | Admitting: Physical Therapy

## 2024-03-19 DIAGNOSIS — M7062 Trochanteric bursitis, left hip: Secondary | ICD-10-CM | POA: Insufficient documentation

## 2024-03-19 DIAGNOSIS — M25552 Pain in left hip: Secondary | ICD-10-CM | POA: Insufficient documentation

## 2024-03-19 DIAGNOSIS — M6281 Muscle weakness (generalized): Secondary | ICD-10-CM | POA: Insufficient documentation

## 2024-03-19 NOTE — Therapy (Signed)
 OUTPATIENT PHYSICAL THERAPY LOWER EXTREMITY treatment   Patient Name: Jennifer Noble MRN: [id number] DOB:[birth year], 48 y.o., female Today's Date: 03/19/2024  END OF SESSION:  PT End of Session - 03/19/24 0805     Visit Number 11    Number of Visits 16    Date for PT Re-Evaluation 05/14/24    Authorization Type CAFA    PT Start Time 0803    PT Stop Time 0845    PT Time Calculation (min) 42 min    Activity Tolerance Patient tolerated treatment well    Behavior During Therapy [facility] for tasks assessed/performed                   Past Medical History:  Diagnosis Date   Anxiety    Depression    Dysuria    Hemorrhoid    High cholesterol    Past Surgical History:  Procedure Laterality Date   GLUTEUS MINIMUS REPAIR Left 01/05/2024   Procedure: REPAIR, TENDON, GLUTEUS MINIMUS;  Surgeon: Genelle Standing, MD;  Location: [facility];  Service: Orthopedics;  Laterality: Left;  LEFT GLUTEUS MEDIUS REPAIR AND POSSIBLE COLLAGEN PATCH AUGMENTATION   HEMORRHOID SURGERY     Patient Active Problem List   Diagnosis Date Noted   Tear of left gluteus minimus tendon 01/05/2024   Vasomotor symptoms due to menopause 12/03/2022   Headache 05/07/2016   Depression 08/17/2015   Insomnia 08/17/2015   Elevated lipids 04/08/2013   Family history of early CAD 04/08/2013   Hemorrhoid    Dysuria     PCP: Haze Arlington NP  REFERRING PROVIDER: DR Elia Genelle   REFERRING DIAG:  Left glut repair   THERAPY DIAG:  Pain in left hip  Muscle weakness (generalized)  Trochanteric bursitis of left hip  Rationale for Evaluation and Treatment: Rehabilitation  ONSET DATE: DOS 01/05/2024  SUBJECTIVE:   SUBJECTIVE STATEMENT:  Patient presents today for re-assessment. Overall she is doing well. She continues  to have pain at times with functional activity but overall it has done well.    Interpretor present.  Pt denies pain at entry.    Eval: Patient has a  long history of left lateral hip and low back pain.  She was found to have a glute med tear.  On 01/05/2024 she had a glute med repair as well as a left trochanteric bursectomy.  At this time she is having pain when she is standing and walking.  She is using 2 crutches.  PERTINENT HISTORY: Anxiety, depression  PAIN:  Are you having pain? Yes: NPRS scale: 0/10 right now  Pain location: left hip Pain description: aching  Aggravating factors: Standing/ walking/ night time  Relieving factors: pain medication   PRECAUTIONS: None  RED FLAGS: None   WEIGHT BEARING RESTRICTIONS: Yes WBAT   FALLS:  Has patient fallen in last 6 months? 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  DIAGNOSTIC FINDINGS:  Nothing post op   PATIENT SURVEYS:  LEFS  Extreme difficulty/unable (0), Quite a bit of difficulty (1), Moderate difficulty (2), Little difficulty (3), No difficulty (4) Survey date:    Any of your usual work, housework or school activities   2.  Usual hobbies, recreational or sporting activities   3. Getting into/out of the bath   4. Walking between rooms   5. Putting on socks/shoes   6. Squatting    7. Lifting an object, like a bag of groceries from the floor   8. Performing light activities around your home   9. Performing heavy activities around your home   10. Getting into/out of a car   11. Walking 2 blocks   12. Walking 1 mile   13. Going up/down 10 stairs (1 flight)   14. Standing for 1 hour   15.  sitting for 1 hour   16. Running on even ground   17. Running on uneven ground   18. Making sharp turns while running fast   19. Hopping    20. Rolling over in bed   Score total:  1/80      COGNITION: Overall cognitive status: Within functional limits for tasks assessed     SENSATION: WFL  EDEMA:  No noticeable edema  POSTURE: No Significant postural limitations  PALPATION: No unexpected tenderness to palpation   LOWER EXTREMITY ROM:  Passive ROM Right eval Left eval Left   Hip flexion  78 108  Hip extension     Hip abduction     Hip adduction     Hip internal rotation  5 8  Hip external rotation  Not performed 2nd to protocol  36  Knee flexion     Knee extension     Ankle dorsiflexion     Ankle plantarflexion     Ankle inversion     Ankle eversion      (Blank rows = not tested)  LOWER EXTREMITY MMT:  MMT Right 8/1 Left 8/1  Hip flexion 42.1 19.5  Hip extension    Hip abduction 36.4 33.0  Hip adduction    Hip internal rotation    Hip external rotation    Knee flexion    Knee extension 46.2 46.4  Ankle dorsiflexion    Ankle plantarflexion    Ankle inversion    Ankle eversion     (Blank rows = not tested) Not tested 2nd to recent surgery    FUNCTIONAL TESTS:  Using hands to stand form a surface   GAIT: Using 2 crutches. Limited weight bearing at this time                                                                                                                               TREATMENT DATE:  8/1 There-ex:  strength testing.  LAQ 3 #  Nu-step L4 5 min   Neuro-re-ed:  Bridge 3x12 with cuing for breathing  Supine clamshell with breathing 3x12 green   TherAct: Step down 3x10 2 inch   7/7  Manual: Roller to HS/calf in prone position at end of sesison  There-ex:  Nu-step x39min L3 PROM L hip LAQ 3# lbs 2x15 5 hold Seated HSC GTB 2x10  Bridge 2x15 Hooklying hip abduction isometric with gait belt 5 2x10 Standing HR/TR 2x15 Standing march 2x10  TherAct: Squats 2x10 Standing marches 2x10ea Step ups 6 2x10 fwd    7/2  Manual: Roller to proximal hip flexors, glutes, HS in supine and prone positions.    There-ex:  Nu-step x41min L4 PROM L hip LAQ 2.5# lbs 2x15 5 hold Bridge 2x15 Hooklying hip abduction isometric with gait belt 5 2x10 Standing HR/TR 2x15 Standing march 2x10 Partial squats 2x10 Standing HSC 2x15  TherAct: Squats 2x10 Step ups 4 2x10 fwd and lateral   6/26  Manual: Roller to proximal hip flexors, glutes, HS in supine and prone positions.   There-ex:  Nu-step x43min L3 PROM L hip in protocol ranges LAQ 2.5# lbs 2x15 5 hold Prone HSC x20 HSC RTB 2x15 Bridge 2x10  Standing HR/TR 2x15 Standing march 2x10 Partial squats 2x10 Standing HSC 2x15  TherAct: Squats 2x10   PATIENT EDUCATION:  Education details: HEP, symptom management  Person educated: Patient Education method: Explanation, Demonstration, Tactile cues, Verbal cues, and Handouts Education comprehension: verbalized understanding, returned demonstration, verbal cues required, tactile cues required, and needs further education  HOME EXERCISE PROGRAM: Access Code: [email or web address] URL: [email or web address] Date: 01/09/2024 Prepared by: Alm Don  Exercises - Supine Heel Slide with Strap  - 3 x daily - 7 x weekly - 3 sets - 5 reps - 5 hold - Supine Quad Set  - 4-5 x daily - 7 x weekly - 3 sets - 10 reps - Seated Ankle Pumps  - 1 x daily - 6 x weekly - 3 sets - 10 reps - Supine Transversus Abdominis Bracing - Hands on Stomach  - 3-4 x daily - 7 x weekly - 3 sets - 10 reps  ASSESSMENT:  CLINICAL IMPRESSION: The patient is making excellent progress. She has had to take several weeks off waiting for approval from [facility] assistance. Despite her time off she continues to make progress. She will be going back to work on Monday. Her strength has improved significantly. She I nearing full ROM. She still has tightness at times We reviewed stretching she can do. She would benefit from further skilled therapy 2W6.    Eval: Patient is a 48 year old female status post glute med repair and  left trochanteric bursa bursectomy.  She presents with expected limitations in motion, strength, and general functional mobility.  She is currently walking with 2 crutches.  She is having pain but it is improving.  She would benefit from skilled therapy to improve her ability to ambulate and perform daily tasks.  OBJECTIVE IMPAIRMENTS: Abnormal gait, decreased activity tolerance, decreased endurance, decreased mobility, difficulty walking, decreased ROM, decreased strength, increased edema, and pain.   ACTIVITY LIMITATIONS: carrying, lifting, bending, sitting, standing, squatting, sleeping, stairs, transfers, bed mobility, bathing, hygiene/grooming, and locomotion level  PARTICIPATION LIMITATIONS: meal prep, cleaning, laundry, driving, shopping, community activity, and occupation  PERSONAL FACTORS: 1-2 comorbidities: anxiety are also affecting patient's functional outcome.   REHAB POTENTIAL: Good  CLINICAL DECISION MAKING: Evolving/moderate complexity Pain limiting general function   EVALUATION COMPLEXITY: Moderate   GOALS: Goals reviewed with patient? Yes  SHORT TERM GOALS: Target date: 02/06/2024   Patient will get in and out of bed independently without increased pain Baseline: Goal status: IN PROGRESS 6/11  2.  Patient will progress to ambulation without crutches as tolerated Baseline:  Goal status: MET (uses [facility] 6/11)  3.  Patient will demonstrate good gluteal contraction without increased pain Baseline:  Goal status:  MET 6/11  4.  Patient will increase passive range of motion on the left into flexion to 120 degrees Baseline:  Goal status: achieved and updated   5.  Patient will be independent with basic HEP Baseline:  Goal status: MET 6/11   LONG TERM GOALS: Target date: 03/05/2024    Patient will go up and down 2 steps with reciprocal gait pattern in order to get in and out of her house Baseline:  Goal status progressing stair training 8/1  2.  Patient will  ambulate community distances without increased pain Baseline:  Goal status: mild pain 8/1  3.  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  Patient will have complete exercise program to promote further strengthening and general functional mobility Baseline:  Goal status: progressing  L   PLAN:  PT FREQUENCY: 2x/week  PT DURATION: 8 weeks  PLANNED INTERVENTIONS: 97110-Therapeutic exercises, 97530- Therapeutic activity, [id number]- Neuromuscular re-education, 97535- Self Care, [id number]- Manual therapy, [id number]- Gait training, [phone]- Aquatic Therapy, 97014- Electrical stimulation (unattended), 97035- Ultrasound, Patient/Family education, Stair training, Taping, Dry Needling, DME instructions, Cryotherapy, and Moist heat   PLAN FOR NEXT SESSION: Continue with passive range of motion.  Follow glutes Meade repair protocol.  Progress gait training as tolerated.   Alm JINNY Don, PT 03/19/2024, 4:20 PM

## 2024-03-23 ENCOUNTER — Ambulatory Visit (HOSPITAL_BASED_OUTPATIENT_CLINIC_OR_DEPARTMENT_OTHER): Payer: Self-pay

## 2024-03-23 DIAGNOSIS — M6281 Muscle weakness (generalized): Secondary | ICD-10-CM

## 2024-03-23 DIAGNOSIS — M7062 Trochanteric bursitis, left hip: Secondary | ICD-10-CM

## 2024-03-23 DIAGNOSIS — M25552 Pain in left hip: Secondary | ICD-10-CM

## 2024-03-23 NOTE — Therapy (Signed)
 OUTPATIENT PHYSICAL THERAPY LOWER EXTREMITY treatment   Patient Name: Jennifer Noble MRN: [id number] DOB:[birth year], 48 y.o., female Today's Date: 03/23/2024  END OF SESSION:  PT End of Session - 03/23/24 0822     Visit Number 12    Number of Visits 16    Date for PT Re-Evaluation 05/14/24    Authorization Type CAFA    PT Start Time 0803    PT Stop Time 0845    PT Time Calculation (min) 42 min    Activity Tolerance Patient tolerated treatment well    Behavior During Therapy [facility] for tasks assessed/performed                    Past Medical History:  Diagnosis Date   Anxiety    Depression    Dysuria    Hemorrhoid    High cholesterol    Past Surgical History:  Procedure Laterality Date   GLUTEUS MINIMUS REPAIR Left 01/05/2024   Procedure: REPAIR, TENDON, GLUTEUS MINIMUS;  Surgeon: Genelle Standing, MD;  Location: [facility];  Service: Orthopedics;  Laterality: Left;  LEFT GLUTEUS MEDIUS REPAIR AND POSSIBLE COLLAGEN PATCH AUGMENTATION   HEMORRHOID SURGERY     Patient Active Problem List   Diagnosis Date Noted   Tear of left gluteus minimus tendon 01/05/2024   Vasomotor symptoms due to menopause 12/03/2022   Headache 05/07/2016   Depression 08/17/2015   Insomnia 08/17/2015   Elevated lipids 04/08/2013   Family history of early CAD 04/08/2013   Hemorrhoid    Dysuria     PCP: Haze Arlington NP  REFERRING PROVIDER: DR Elia Genelle   REFERRING DIAG:  Left glut repair   THERAPY DIAG:  Pain in left hip  Muscle weakness (generalized)  Trochanteric bursitis of left hip  Rationale for Evaluation and Treatment: Rehabilitation  ONSET DATE: DOS 01/05/2024  SUBJECTIVE:   SUBJECTIVE STATEMENT: Interpretor present.  Pt denies pain at entry in R hip.    Eval: Patient has a long history of left lateral hip and low back pain.  She was found to have a glute med tear.  On 01/05/2024 she had a glute med repair as well as a left  trochanteric bursectomy.  At this time she is having pain when she is standing and walking.  She is using 2 crutches.  PERTINENT HISTORY: Anxiety, depression  PAIN:  Are you having pain? Yes: NPRS scale: 0/10 right now  Pain location: left hip Pain description: aching  Aggravating factors: Standing/ walking/ night time  Relieving factors: pain medication   PRECAUTIONS: None  RED FLAGS: None   WEIGHT BEARING RESTRICTIONS: Yes WBAT   FALLS:  Has patient fallen in last 6 months? 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  DIAGNOSTIC FINDINGS:  Nothing post op   PATIENT SURVEYS:  LEFS  Extreme difficulty/unable (0), Quite a bit of difficulty (1), Moderate difficulty (2), Little difficulty (3), No difficulty (4) Survey date:    Any of your usual work, housework or school activities   2. Usual hobbies, recreational or sporting activities   3. Getting into/out of the bath   4. Walking between rooms   5. Putting on socks/shoes  6. Squatting    7. Lifting an object, like a bag of groceries from the floor   8. Performing light activities around your home   9. Performing heavy activities around your home   10. Getting into/out of a car   11. Walking 2 blocks   12. Walking 1 mile   13. Going up/down 10 stairs (1 flight)   14. Standing for 1 hour   15.  sitting for 1 hour   16. Running on even ground   17. Running on uneven ground   18. Making sharp turns while running fast   19. Hopping    20. Rolling over in bed   Score total:  1/80     COGNITION: Overall cognitive status: Within functional limits for tasks assessed     SENSATION: WFL  EDEMA:  No noticeable edema  POSTURE: No Significant  postural limitations  PALPATION: No unexpected tenderness to palpation   LOWER EXTREMITY ROM:  Passive ROM Right eval Left eval Left   Hip flexion  78 108  Hip extension     Hip abduction     Hip adduction     Hip internal rotation  5 8  Hip external rotation  Not performed 2nd to protocol  36  Knee flexion     Knee extension     Ankle dorsiflexion     Ankle plantarflexion     Ankle inversion     Ankle eversion      (Blank rows = not tested)  LOWER EXTREMITY MMT:  MMT Right 8/1 Left 8/1  Hip flexion 42.1 19.5  Hip extension    Hip abduction 36.4 33.0  Hip adduction    Hip internal rotation    Hip external rotation    Knee flexion    Knee extension 46.2 46.4  Ankle dorsiflexion    Ankle plantarflexion    Ankle inversion    Ankle eversion     (Blank rows = not tested) Not tested 2nd to recent surgery    FUNCTIONAL TESTS:  Using hands to stand form a surface   GAIT: Using 2 crutches. Limited weight bearing at this time                                                                                                                               TREATMENT DATE:  8/5 There-ex: Nu-step L5 5 min  LAQ 3 # 3 2x15 Seated HSC GTB 2x15  Neuro-re-ed:  Bridge 3x12  Supine clamshell3x12 green  Sidelying clam 2x15ea  TherAct: Step down 3x10 2 inch  Squats 2x10 Standing marches 2x10ea Step ups 6 2x10 fwd    8/1 There-ex:  strength testing.  LAQ 3 #  Nu-step L4 5 min   Neuro-re-ed:  Bridge 3x12 with cuing for breathing  Supine clamshell with breathing 3x12 green   TherAct: Step down 3x10 2 inch   7/7  Manual: Roller to HS/calf in prone position at  end of sesison  There-ex:  Nu-step x45min L3 PROM L hip LAQ 3# lbs 2x15 5 hold Seated HSC GTB 2x10 Bridge 2x15 Hooklying hip abduction isometric with gait belt 5 2x10 Standing HR/TR 2x15 Standing march 2x10  TherAct: Squats 2x10 Standing marches 2x10ea Step ups 6 2x10 fwd     7/2  Manual: Roller to proximal hip flexors, glutes, HS in supine and prone positions.   There-ex:  Nu-step x48min L4 PROM L hip LAQ 2.5# lbs 2x15 5 hold Bridge 2x15 Hooklying hip abduction isometric with gait belt 5 2x10 Standing HR/TR 2x15 Standing march 2x10 Partial squats 2x10 Standing HSC 2x15  TherAct: Squats 2x10 Step ups 4 2x10 fwd and lateral   6/26  Manual: Roller to proximal hip flexors, glutes, HS in supine and prone positions.   There-ex:  Nu-step x79min L3 PROM L hip in protocol ranges LAQ 2.5# lbs 2x15 5 hold Prone HSC x20 HSC RTB 2x15 Bridge 2x10  Standing HR/TR 2x15 Standing march 2x10 Partial squats 2x10 Standing HSC 2x15  TherAct: Squats 2x10   PATIENT EDUCATION:  Education details: HEP, symptom management  Person educated: Patient Education method: Explanation, Demonstration, Tactile cues, Verbal cues, and Handouts Education comprehension: verbalized understanding, returned demonstration, verbal cues required, tactile cues required, and needs further education  HOME EXERCISE PROGRAM: Access Code: [email or web address] URL: [email or web address] Date: 01/09/2024 Prepared by: Alm Don  Exercises - Supine Heel Slide with Strap  - 3 x daily - 7 x weekly - 3 sets - 5 reps - 5 hold - Supine Quad Set  - 4-5 x daily - 7 x weekly - 3 sets - 10 reps - Seated Ankle Pumps  - 1 x daily - 6 x weekly - 3 sets - 10 reps - Supine Transversus Abdominis Bracing - Hands on Stomach  - 3-4 x daily - 7 x weekly - 3 sets - 10 reps  ASSESSMENT:  CLINICAL IMPRESSION: Progressed to s/l clamshells today which caused fatigue in L hip, but did well overall. She performed well with therAct with good tolerance. Continued with 2inch step downs with cues required for proper performance. Pt will benefit from additional LE strengthening especially for lateral hip.    Eval: Patient is a 48 year old female status post glute med repair and left  trochanteric bursa bursectomy.  She presents with expected limitations in motion, strength, and general functional mobility.  She is currently walking with 2 crutches.  She is having pain but it is improving.  She would benefit from skilled therapy to improve her ability to ambulate and perform daily tasks.  OBJECTIVE IMPAIRMENTS: Abnormal gait, decreased activity tolerance, decreased endurance, decreased mobility, difficulty walking, decreased ROM, decreased strength, increased edema, and pain.   ACTIVITY LIMITATIONS: carrying, lifting, bending, sitting, standing, squatting, sleeping, stairs, transfers, bed mobility, bathing, hygiene/grooming, and locomotion level  PARTICIPATION LIMITATIONS: meal prep, cleaning, laundry, driving, shopping, community activity, and occupation  PERSONAL FACTORS: 1-2 comorbidities: anxiety are also affecting patient's functional outcome.   REHAB POTENTIAL: Good  CLINICAL DECISION MAKING: Evolving/moderate complexity Pain limiting general function   EVALUATION COMPLEXITY: Moderate   GOALS: Goals reviewed with patient? Yes  SHORT TERM GOALS: Target date: 02/06/2024   Patient will get in and out of bed independently without increased pain Baseline: Goal status: IN PROGRESS 6/11  2.  Patient will progress to ambulation without crutches as tolerated Baseline:  Goal status: MET (uses [facility] 6/11)  3.  Patient will demonstrate good gluteal contraction without increased pain Baseline:  Goal status: MET  6/11  4.  Patient will increase passive range of motion on the left into flexion to 120 degrees Baseline:  Goal status: achieved and updated   5.  Patient will be independent with basic HEP Baseline:  Goal status: MET 6/11   LONG TERM GOALS: Target date: 03/05/2024    Patient will go up and down 2 steps with reciprocal gait pattern in order to get in and out of her house Baseline:  Goal status progressing stair training 8/1  2.  Patient will  ambulate community distances without increased pain Baseline:  Goal status: mild pain 8/1  3.  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  Patient will have complete exercise program to promote further strengthening and general functional mobility Baseline:  Goal status: progressing  L   PLAN:  PT FREQUENCY: 2x/week  PT DURATION: 8 weeks  PLANNED INTERVENTIONS: 97110-Therapeutic exercises, 97530- Therapeutic activity, [id number]- Neuromuscular re-education, 97535- Self Care, 02859- Manual therapy, [id number]- Gait training, [phone]- Aquatic Therapy, 97014- Electrical stimulation (unattended), 97035- Ultrasound, Patient/Family education, Stair training, Taping, Dry Needling, DME instructions, Cryotherapy, and Moist heat   PLAN FOR NEXT SESSION: Continue with passive range of motion.  Follow glutes Meade repair protocol.  Progress gait training as tolerated.   Asberry BRAVO Trisa Cranor, PTA 03/23/2024, 10:11 AM

## 2024-03-25 ENCOUNTER — Encounter (HOSPITAL_BASED_OUTPATIENT_CLINIC_OR_DEPARTMENT_OTHER): Payer: Self-pay

## 2024-03-25 ENCOUNTER — Ambulatory Visit (HOSPITAL_BASED_OUTPATIENT_CLINIC_OR_DEPARTMENT_OTHER): Payer: Self-pay

## 2024-03-25 DIAGNOSIS — M6281 Muscle weakness (generalized): Secondary | ICD-10-CM

## 2024-03-25 DIAGNOSIS — M25552 Pain in left hip: Secondary | ICD-10-CM

## 2024-03-25 DIAGNOSIS — M7062 Trochanteric bursitis, left hip: Secondary | ICD-10-CM

## 2024-03-25 NOTE — Therapy (Signed)
 OUTPATIENT PHYSICAL THERAPY LOWER EXTREMITY treatment   Patient Name: Jennifer Noble MRN: [id number] DOB:10/18/1975, 48 y.o., female Today's Date: 03/25/2024  END OF SESSION:  PT End of Session - 03/25/24 1136     Visit Number 13    Number of Visits 16    Date for PT Re-Evaluation 05/14/24    Authorization Type CAFA    PT Start Time 0932    PT Stop Time 1015    PT Time Calculation (min) 43 min    Activity Tolerance Patient tolerated treatment well    Behavior During Therapy WFL for tasks assessed/performed                     Past Medical History:  Diagnosis Date   Anxiety    Depression    Dysuria    Hemorrhoid    High cholesterol    Past Surgical History:  Procedure Laterality Date   GLUTEUS MINIMUS REPAIR Left 01/05/2024   Procedure: REPAIR, TENDON, GLUTEUS MINIMUS;  Surgeon: Genelle Standing, MD;  Location: [facility];  Service: Orthopedics;  Laterality: Left;  LEFT GLUTEUS MEDIUS REPAIR AND POSSIBLE COLLAGEN PATCH AUGMENTATION   HEMORRHOID SURGERY     Patient Active Problem List   Diagnosis Date Noted   Tear of left gluteus minimus tendon 01/05/2024   Vasomotor symptoms due to menopause 12/03/2022   Headache 05/07/2016   Depression 08/17/2015   Insomnia 08/17/2015   Elevated lipids 04/08/2013   Family history of early CAD 04/08/2013   Hemorrhoid    Dysuria     PCP: Haze Arlington NP  REFERRING PROVIDER: DR Elia Genelle   REFERRING DIAG:  Left glut repair   THERAPY DIAG:  Pain in left hip  Muscle weakness (generalized)  Trochanteric bursitis of left hip  Rationale for Evaluation and Treatment: Rehabilitation  ONSET DATE: DOS 01/05/2024  SUBJECTIVE:   SUBJECTIVE STATEMENT: Interpretor present.  Pt denies pain at entry in R hip. She had some soreness after last session, but not too bad.    Eval: Patient has a long history of left lateral hip and low back pain.  She was found to have a glute med tear.   On 01/05/2024 she had a glute med repair as well as a left trochanteric bursectomy.  At this time she is having pain when she is standing and walking.  She is using 2 crutches.  PERTINENT HISTORY: Anxiety, depression  PAIN:  Are you having pain? Yes: NPRS scale: 0/10 right now  Pain location: left hip Pain description: aching  Aggravating factors: Standing/ walking/ night time  Relieving factors: pain medication   PRECAUTIONS: None  RED FLAGS: None   WEIGHT BEARING RESTRICTIONS: Yes WBAT   FALLS:  Has patient fallen in last 6 months? 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  DIAGNOSTIC FINDINGS:  Nothing post op   PATIENT SURVEYS:  LEFS  Extreme difficulty/unable (0), Quite a bit of difficulty (1), Moderate difficulty (2), Little difficulty (3), No difficulty (4) Survey date:    Any of your usual work, housework or school activities   2. Usual hobbies, recreational or sporting activities   3. Getting into/out of the bath  4. Walking between rooms   5. Putting on socks/shoes   6. Squatting    7. Lifting an object, like a bag of groceries from the floor   8. Performing light activities around your home   9. Performing heavy activities around your home   10. Getting into/out of a car   11. Walking 2 blocks   12. Walking 1 mile   13. Going up/down 10 stairs (1 flight)   14. Standing for 1 hour   15.  sitting for 1 hour   16. Running on even ground   17. Running on uneven ground   18. Making sharp turns while running fast   19. Hopping    20. Rolling over in bed   Score total:  1/80     COGNITION: Overall cognitive status: Within functional limits for tasks  assessed     SENSATION: WFL  EDEMA:  No noticeable edema  POSTURE: No Significant postural limitations  PALPATION: No unexpected tenderness to palpation   LOWER EXTREMITY ROM:  Passive ROM Right eval Left eval Left   Hip flexion  78 108  Hip extension     Hip abduction     Hip adduction     Hip internal rotation  5 8  Hip external rotation  Not performed 2nd to protocol  36  Knee flexion     Knee extension     Ankle dorsiflexion     Ankle plantarflexion     Ankle inversion     Ankle eversion      (Blank rows = not tested)  LOWER EXTREMITY MMT:  MMT Right 8/1 Left 8/1  Hip flexion 42.1 19.5  Hip extension    Hip abduction 36.4 33.0  Hip adduction    Hip internal rotation    Hip external rotation    Knee flexion    Knee extension 46.2 46.4  Ankle dorsiflexion    Ankle plantarflexion    Ankle inversion    Ankle eversion     (Blank rows = not tested) Not tested 2nd to recent surgery    FUNCTIONAL TESTS:  Using hands to stand form a surface   GAIT: Using 2 crutches. Limited weight bearing at this time                                                                                                                               TREATMENT DATE:   8/7 There-ex: Nu-step L5 5 min  LAQ 3 # 3 2x15 Seated HSC GTB 2x15 Side stepping along rail x2 laps  Neuro-re-ed:  Bridge 3x10 Supine clamshell 2x15 green  Sidelying clam 2x10ea  TherAct: Step down 2x15 4 inch  Squats 2x15 Standing marches on airex 2x10ea Step ups 6 2x15 fwd    8/5 There-ex: Nu-step L5 5 min  LAQ 3 # 3 2x15 Seated HSC GTB 2x15  Neuro-re-ed:  Bridge 3x12  Supine clamshell3x12 green  Sidelying clam 2x15ea  TherAct: Step down 3x10 2 inch  Squats 2x10 Standing marches 2x10ea Step ups 6 2x10 fwd    8/1 There-ex:  strength testing.  LAQ 3 #  Nu-step L4 5 min   Neuro-re-ed:  Bridge 3x12 with cuing for breathing  Supine clamshell with breathing 3x12 green    TherAct: Step down 3x10 2 inch   7/7  Manual: Roller to HS/calf in prone position at end of sesison  There-ex:  Nu-step x69min L3 PROM L hip LAQ 3# lbs 2x15 5 hold Seated HSC GTB 2x10 Bridge 2x15 Hooklying hip abduction isometric with gait belt 5 2x10 Standing HR/TR 2x15 Standing march 2x10  TherAct: Squats 2x10 Standing marches 2x10ea Step ups 6 2x10 fwd    7/2  Manual: Roller to proximal hip flexors, glutes, HS in supine and prone positions.   There-ex:  Nu-step x53min L4 PROM L hip LAQ 2.5# lbs 2x15 5 hold Bridge 2x15 Hooklying hip abduction isometric with gait belt 5 2x10 Standing HR/TR 2x15 Standing march 2x10 Partial squats 2x10 Standing HSC 2x15  TherAct: Squats 2x10 Step ups 4 2x10 fwd and lateral   6/26  Manual: Roller to proximal hip flexors, glutes, HS in supine and prone positions.   There-ex:  Nu-step x72min L3 PROM L hip in protocol ranges LAQ 2.5# lbs 2x15 5 hold Prone HSC x20 HSC RTB 2x15 Bridge 2x10  Standing HR/TR 2x15 Standing march 2x10 Partial squats 2x10 Standing HSC 2x15  TherAct: Squats 2x10   PATIENT EDUCATION:  Education details: HEP, symptom management  Person educated: Patient Education method: Explanation, Demonstration, Tactile cues, Verbal cues, and Handouts Education comprehension: verbalized understanding, returned demonstration, verbal cues required, tactile cues required, and needs further education  HOME EXERCISE PROGRAM: Access Code: [email or web address] URL: [email or web address] Date: 01/09/2024 Prepared by: Alm Don  Exercises - Supine Heel Slide with Strap  - 3 x daily - 7 x weekly - 3 sets - 5 reps - 5 hold - Supine Quad Set  - 4-5 x daily - 7 x weekly - 3 sets - 10 reps - Seated Ankle Pumps  - 1 x daily - 6 x weekly - 3 sets - 10 reps - Supine Transversus Abdominis Bracing - Hands on Stomach  - 3-4 x daily - 7 x weekly - 3 sets - 10 reps  ASSESSMENT:  CLINICAL  IMPRESSION: Continued with s/l clamshells to work on lateral hip strengthening. Did decrease in reps due to c/o soreness, though she denied increase in pain. Increased height with step downs today with good tolerance, though she did report muscular fatigue. Will continue to focus on lateral hip strengthening in addition to LE stability.    Eval: Patient is a 48 year old female status post glute med repair and left trochanteric bursa bursectomy.  She presents with expected limitations in motion, strength, and general functional mobility.  She is currently walking with 2 crutches.  She is having pain but it is improving.  She would benefit from skilled therapy to improve her ability to ambulate and perform daily tasks.  OBJECTIVE IMPAIRMENTS: Abnormal gait, decreased activity tolerance, decreased endurance, decreased mobility, difficulty walking, decreased ROM, decreased strength, increased edema, and pain.   ACTIVITY LIMITATIONS: carrying, lifting, bending, sitting, standing, squatting, sleeping, stairs, transfers, bed mobility, bathing, hygiene/grooming, and locomotion level  PARTICIPATION LIMITATIONS: meal prep, cleaning, laundry, driving, shopping, community activity, and occupation  PERSONAL FACTORS: 1-2 comorbidities: anxiety are also affecting patient's functional outcome.   REHAB POTENTIAL: Good  CLINICAL DECISION MAKING: Evolving/moderate complexity Pain limiting general  function   EVALUATION COMPLEXITY: Moderate   GOALS: Goals reviewed with patient? Yes  SHORT TERM GOALS: Target date: 02/06/2024   Patient will get in and out of bed independently without increased pain Baseline: Goal status: IN PROGRESS 6/11  2.  Patient will progress to ambulation without crutches as tolerated Baseline:  Goal status: MET (uses [facility] 6/11)  3.  Patient will demonstrate good gluteal contraction without increased pain Baseline:  Goal status: MET 6/11  4.  Patient will increase passive range  of motion on the left into flexion to 120 degrees Baseline:  Goal status: achieved and updated   5.  Patient will be independent with basic HEP Baseline:  Goal status: MET 6/11   LONG TERM GOALS: Target date: 03/05/2024    Patient will go up and down 2 steps with reciprocal gait pattern in order to get in and out of her house Baseline:  Goal status progressing stair training 8/1  2.  Patient will ambulate community distances without increased pain Baseline:  Goal status: mild pain 8/1  3.  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  Patient will have complete exercise program to promote further strengthening and general functional mobility Baseline:  Goal status: progressing  L   PLAN:  PT FREQUENCY: 2x/week  PT DURATION: 8 weeks  PLANNED INTERVENTIONS: 97110-Therapeutic exercises, 97530- Therapeutic activity, [id number]- Neuromuscular re-education, 97535- Self Care, [id number]- Manual therapy, [id number]- Gait training, [phone]- Aquatic Therapy, [id number]- Electrical stimulation (unattended), 97035- Ultrasound, Patient/Family education, Stair training, Taping, Dry Needling, DME instructions, Cryotherapy, and Moist heat   PLAN FOR NEXT SESSION: Continue with passive range of motion.  Follow glutes Meade repair protocol.  Progress gait training as tolerated.   Asberry BRAVO Ricka Westra, PTA 03/25/2024, 11:43 AM

## 2024-03-30 ENCOUNTER — Ambulatory Visit (HOSPITAL_BASED_OUTPATIENT_CLINIC_OR_DEPARTMENT_OTHER): Payer: Self-pay

## 2024-03-30 ENCOUNTER — Encounter (HOSPITAL_BASED_OUTPATIENT_CLINIC_OR_DEPARTMENT_OTHER): Payer: Self-pay

## 2024-03-30 DIAGNOSIS — M25552 Pain in left hip: Secondary | ICD-10-CM

## 2024-03-30 DIAGNOSIS — M7062 Trochanteric bursitis, left hip: Secondary | ICD-10-CM

## 2024-03-30 DIAGNOSIS — M6281 Muscle weakness (generalized): Secondary | ICD-10-CM

## 2024-03-30 NOTE — Therapy (Signed)
 OUTPATIENT PHYSICAL THERAPY LOWER EXTREMITY treatment   Patient Name: Jennifer Noble MRN: [id number] DOB:1976/07/02, 48 y.o., female Today's Date: 03/30/2024  END OF SESSION:  PT End of Session - 03/30/24 1312     Visit Number 14    Number of Visits 16    Date for PT Re-Evaluation 05/14/24    Authorization Type CAFA    PT Start Time 1305    PT Stop Time 1345    PT Time Calculation (min) 40 min    Activity Tolerance Patient tolerated treatment well    Behavior During Therapy WFL for tasks assessed/performed                      Past Medical History:  Diagnosis Date   Anxiety    Depression    Dysuria    Hemorrhoid    High cholesterol    Past Surgical History:  Procedure Laterality Date   GLUTEUS MINIMUS REPAIR Left 01/05/2024   Procedure: REPAIR, TENDON, GLUTEUS MINIMUS;  Surgeon: Genelle Standing, MD;  Location: [facility];  Service: Orthopedics;  Laterality: Left;  LEFT GLUTEUS MEDIUS REPAIR AND POSSIBLE COLLAGEN PATCH AUGMENTATION   HEMORRHOID SURGERY     Patient Active Problem List   Diagnosis Date Noted   Tear of left gluteus minimus tendon 01/05/2024   Vasomotor symptoms due to menopause 12/03/2022   Headache 05/07/2016   Depression 08/17/2015   Insomnia 08/17/2015   Elevated lipids 04/08/2013   Family history of early CAD 04/08/2013   Hemorrhoid    Dysuria     PCP: Haze Arlington NP  REFERRING PROVIDER: DR Elia Genelle   REFERRING DIAG:  Left glut repair   THERAPY DIAG:  Pain in left hip  Trochanteric bursitis of left hip  Muscle weakness (generalized)  Rationale for Evaluation and Treatment: Rehabilitation  ONSET DATE: DOS 01/05/2024  SUBJECTIVE:   SUBJECTIVE STATEMENT: Interpretor present.  Pt has returned to work. Denies pain at work, only a little fatigue. She denies pain at entry.    Eval: Patient has a long history of left lateral hip and low back pain.  She was found to have a glute med  tear.  On 01/05/2024 she had a glute med repair as well as a left trochanteric bursectomy.  At this time she is having pain when she is standing and walking.  She is using 2 crutches.  PERTINENT HISTORY: Anxiety, depression  PAIN:  Are you having pain? Yes: NPRS scale: 0/10 right now  Pain location: left hip Pain description: aching  Aggravating factors: Standing/ walking/ night time  Relieving factors: pain medication   PRECAUTIONS: None  RED FLAGS: None   WEIGHT BEARING RESTRICTIONS: Yes WBAT   FALLS:  Has patient fallen in last 6 months? 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  DIAGNOSTIC FINDINGS:  Nothing post op   PATIENT SURVEYS:  LEFS  Extreme difficulty/unable (0), Quite a bit of difficulty (1), Moderate difficulty (2), Little difficulty (3), No difficulty (4) Survey date:    Any of your usual work, housework or school activities   2. Usual hobbies, recreational or sporting activities   3. Getting into/out of the bath  4. Walking between rooms   5. Putting on socks/shoes   6. Squatting    7. Lifting an object, like a bag of groceries from the floor   8. Performing light activities around your home   9. Performing heavy activities around your home   10. Getting into/out of a car   11. Walking 2 blocks   12. Walking 1 mile   13. Going up/down 10 stairs (1 flight)   14. Standing for 1 hour   15.  sitting for 1 hour   16. Running on even ground   17. Running on uneven ground   18. Making sharp turns while running fast   19. Hopping    20. Rolling over in bed   Score total:  1/80     COGNITION: Overall cognitive status: Within functional limits for tasks  assessed     SENSATION: WFL  EDEMA:  No noticeable edema  POSTURE: No Significant postural limitations  PALPATION: No unexpected tenderness to palpation   LOWER EXTREMITY ROM:  Passive ROM Right eval Left eval Left   Hip flexion  78 108  Hip extension     Hip abduction     Hip adduction     Hip internal rotation  5 8  Hip external rotation  Not performed 2nd to protocol  36  Knee flexion     Knee extension     Ankle dorsiflexion     Ankle plantarflexion     Ankle inversion     Ankle eversion      (Blank rows = not tested)  LOWER EXTREMITY MMT:  MMT Right 8/1 Left 8/1  Hip flexion 42.1 19.5  Hip extension    Hip abduction 36.4 33.0  Hip adduction    Hip internal rotation    Hip external rotation    Knee flexion    Knee extension 46.2 46.4  Ankle dorsiflexion    Ankle plantarflexion    Ankle inversion    Ankle eversion     (Blank rows = not tested) Not tested 2nd to recent surgery    FUNCTIONAL TESTS:  Using hands to stand form a surface   GAIT: Using 2 crutches. Limited weight bearing at this time                                                                                                                               TREATMENT DATE:   8/12 There-ex: Nu-step L5 5 min  LAQ 4 # 3 2x15 Seated HSC GTB 2x15 Side stepping along rail x3 laps  Neuro-re-ed:  Bridge 3x10 Supine clamshell 2x15 green  Sidelying clam 3x10ea  TherAct: Step down 2x15 4 inch  Squats 2x15 Step ups 6 2x15 fwd   Manual: STM to L gluteal mm   8/7 There-ex: Nu-step L5 5 min  LAQ 3 # 3 2x15 Seated HSC GTB 2x15 Side stepping along rail x2 laps  Neuro-re-ed:  Bridge 3x10 Supine  clamshell 2x15 green  Sidelying clam 2x10ea  TherAct: Step down 2x15 4 inch  Squats 2x15 Standing marches on airex 2x10ea Step ups 6 2x15 fwd    8/5 There-ex: Nu-step L5 5 min  LAQ 3 # 3 2x15 Seated HSC GTB 2x15  Neuro-re-ed:  Bridge 3x12  Supine clamshell3x12 green   Sidelying clam 2x15ea  TherAct: Step down 3x10 2 inch  Squats 2x10 Standing marches 2x10ea Step ups 6 2x10 fwd    8/1 There-ex:  strength testing.  LAQ 3 #  Nu-step L4 5 min   Neuro-re-ed:  Bridge 3x12 with cuing for breathing  Supine clamshell with breathing 3x12 green   TherAct: Step down 3x10 2 inch   7/7  Manual: Roller to HS/calf in prone position at end of sesison  There-ex:  Nu-step x27min L3 PROM L hip LAQ 3# lbs 2x15 5 hold Seated HSC GTB 2x10 Bridge 2x15 Hooklying hip abduction isometric with gait belt 5 2x10 Standing HR/TR 2x15 Standing march 2x10  TherAct: Squats 2x10 Standing marches 2x10ea Step ups 6 2x10 fwd    7/2  Manual: Roller to proximal hip flexors, glutes, HS in supine and prone positions.   There-ex:  Nu-step x11min L4 PROM L hip LAQ 2.5# lbs 2x15 5 hold Bridge 2x15 Hooklying hip abduction isometric with gait belt 5 2x10 Standing HR/TR 2x15 Standing march 2x10 Partial squats 2x10 Standing HSC 2x15  TherAct: Squats 2x10 Step ups 4 2x10 fwd and lateral   6/26  Manual: Roller to proximal hip flexors, glutes, HS in supine and prone positions.   There-ex:  Nu-step x21min L3 PROM L hip in protocol ranges LAQ 2.5# lbs 2x15 5 hold Prone HSC x20 HSC RTB 2x15 Bridge 2x10  Standing HR/TR 2x15 Standing march 2x10 Partial squats 2x10 Standing HSC 2x15  TherAct: Squats 2x1   PATIENT EDUCATION:  Education details: HEP, symptom management  Person educated: Patient Education method: Explanation, Demonstration, Tactile cues, Verbal cues, and Handouts Education comprehension: verbalized understanding, returned demonstration, verbal cues required, tactile cues required, and needs further education  HOME EXERCISE PROGRAM: Access Code: [email or web address] URL: [email or web address] Date: 01/09/2024 Prepared by: Alm Don  Exercises - Supine Heel Slide with Strap  - 3 x daily - 7 x weekly - 3 sets - 5 reps  - 5 hold - Supine Quad Set  - 4-5 x daily - 7 x weekly - 3 sets - 10 reps - Seated Ankle Pumps  - 1 x daily - 6 x weekly - 3 sets - 10 reps - Supine Transversus Abdominis Bracing - Hands on Stomach  - 3-4 x daily - 7 x weekly - 3 sets - 10 reps  ASSESSMENT:  CLINICAL IMPRESSION: Pt improving with tolerance for lateral hip strengthening. Added in extra set of s/l clams today and initiated standing hip abduction without c/o pain. Worked on STM to L gluteal mm and she did feel soreness here afterwards. Pt progressing appropriately and will benefit from continued PT to meet goals.    Eval: Patient is a 48 year old female status post glute med repair and left trochanteric bursa bursectomy.  She presents with expected limitations in motion, strength, and general functional mobility.  She is currently walking with 2 crutches.  She is having pain but it is improving.  She would benefit from skilled therapy to improve her ability to ambulate and perform daily tasks.  OBJECTIVE IMPAIRMENTS: Abnormal gait, decreased activity tolerance, decreased endurance, decreased mobility, difficulty walking, decreased ROM, decreased strength, increased edema, and pain.  ACTIVITY LIMITATIONS: carrying, lifting, bending, sitting, standing, squatting, sleeping, stairs, transfers, bed mobility, bathing, hygiene/grooming, and locomotion level  PARTICIPATION LIMITATIONS: meal prep, cleaning, laundry, driving, shopping, community activity, and occupation  PERSONAL FACTORS: 1-2 comorbidities: anxiety are also affecting patient's functional outcome.   REHAB POTENTIAL: Good  CLINICAL DECISION MAKING: Evolving/moderate complexity Pain limiting general function   EVALUATION COMPLEXITY: Moderate   GOALS: Goals reviewed with patient? Yes  SHORT TERM GOALS: Target date: 02/06/2024   Patient will get in and out of bed independently without increased pain Baseline: Goal status: IN PROGRESS 6/11  2.  Patient will  progress to ambulation without crutches as tolerated Baseline:  Goal status: MET (uses [facility] 6/11)  3.  Patient will demonstrate good gluteal contraction without increased pain Baseline:  Goal status: MET 6/11  4.  Patient will increase passive range of motion on the left into flexion to 120 degrees Baseline:  Goal status: achieved and updated   5.  Patient will be independent with basic HEP Baseline:  Goal status: MET 6/11   LONG TERM GOALS: Target date: 03/05/2024    Patient will go up and down 2 steps with reciprocal gait pattern in order to get in and out of her house Baseline:  Goal status progressing stair training 8/1  2.  Patient will ambulate community distances without increased pain Baseline:  Goal status: mild pain 8/1  3.  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  Patient will have complete exercise program to promote further strengthening and general functional mobility Baseline:  Goal status: progressing  L   PLAN:  PT FREQUENCY: 2x/week  PT DURATION: 8 weeks  PLANNED INTERVENTIONS: 97110-Therapeutic exercises, 97530- Therapeutic activity, [id number]- Neuromuscular re-education, 97535- Self Care, [id number]- Manual therapy, [id number]- Gait training, [phone]- Aquatic Therapy, 97014- Electrical stimulation (unattended), 97035- Ultrasound, Patient/Family education, Stair training, Taping, Dry Needling, DME instructions, Cryotherapy, and Moist heat   PLAN FOR NEXT SESSION: Continue with passive range of motion.  Follow glutes Meade repair protocol.  Progress gait training as tolerated.   Asberry BRAVO Kamonte Mcmichen, PTA 03/30/2024, 5:12 PM

## 2024-04-01 ENCOUNTER — Ambulatory Visit (HOSPITAL_BASED_OUTPATIENT_CLINIC_OR_DEPARTMENT_OTHER): Payer: Self-pay | Admitting: Physical Therapy

## 2024-04-01 ENCOUNTER — Encounter (HOSPITAL_BASED_OUTPATIENT_CLINIC_OR_DEPARTMENT_OTHER): Payer: Self-pay | Admitting: Physical Therapy

## 2024-04-01 DIAGNOSIS — M6281 Muscle weakness (generalized): Secondary | ICD-10-CM

## 2024-04-01 DIAGNOSIS — M7062 Trochanteric bursitis, left hip: Secondary | ICD-10-CM

## 2024-04-01 DIAGNOSIS — M25552 Pain in left hip: Secondary | ICD-10-CM

## 2024-04-01 NOTE — Therapy (Signed)
 OUTPATIENT PHYSICAL THERAPY LOWER EXTREMITY treatment   Patient Name: Jennifer Noble MRN: [id number] DOB:Oct 27, 1975, 48 y.o., female Today's Date: 04/01/2024  END OF SESSION:  PT End of Session - 04/01/24 0804     Visit Number 15    Number of Visits 16    Date for PT Re-Evaluation 05/14/24    Authorization Type CAFA    PT Start Time 0804    PT Stop Time 0843    PT Time Calculation (min) 39 min    Activity Tolerance Patient tolerated treatment well    Behavior During Therapy [facility] for tasks assessed/performed                      Past Medical History:  Diagnosis Date   Anxiety    Depression    Dysuria    Hemorrhoid    High cholesterol    Past Surgical History:  Procedure Laterality Date   GLUTEUS MINIMUS REPAIR Left 01/05/2024   Procedure: REPAIR, TENDON, GLUTEUS MINIMUS;  Surgeon: Genelle Standing, MD;  Location: [facility];  Service: Orthopedics;  Laterality: Left;  LEFT GLUTEUS MEDIUS REPAIR AND POSSIBLE COLLAGEN PATCH AUGMENTATION   HEMORRHOID SURGERY     Patient Active Problem List   Diagnosis Date Noted   Tear of left gluteus minimus tendon 01/05/2024   Vasomotor symptoms due to menopause 12/03/2022   Headache 05/07/2016   Depression 08/17/2015   Insomnia 08/17/2015   Elevated lipids 04/08/2013   Family history of early CAD 04/08/2013   Hemorrhoid    Dysuria     PCP: Haze Arlington NP  REFERRING PROVIDER: DR Elia Genelle   REFERRING DIAG:  Left glut repair   THERAPY DIAG:  Pain in left hip  Trochanteric bursitis of left hip  Muscle weakness (generalized)  Rationale for Evaluation and Treatment: Rehabilitation  ONSET DATE: DOS 01/05/2024  SUBJECTIVE:   SUBJECTIVE STATEMENT: Interpretor present.  Pt states hip is getting better. Some knee/hamstring pain. Some pain with walking for a while. Doesn't use stairs that much.    Eval: Patient has a long history of left lateral hip and low back pain.  She was  found to have a glute med tear.  On 01/05/2024 she had a glute med repair as well as a left trochanteric bursectomy.  At this time she is having pain when she is standing and walking.  She is using 2 crutches.  PERTINENT HISTORY: Anxiety, depression  PAIN:  Are you having pain? Yes: NPRS scale: 0/10 right now  Pain location: left hip Pain description: aching  Aggravating factors: Standing/ walking/ night time  Relieving factors: pain medication   PRECAUTIONS: None  RED FLAGS: None   WEIGHT BEARING RESTRICTIONS: Yes WBAT   FALLS:  Has patient fallen in last 6 months? 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  DIAGNOSTIC FINDINGS:  Nothing post op   PATIENT SURVEYS:  LEFS  Extreme difficulty/unable (0), Quite a bit of difficulty (1), Moderate difficulty (2), Little difficulty (3), No difficulty (4) Survey date:    Any of your usual work, housework or school activities   2. Usual hobbies, recreational or sporting activities   3. Getting into/out of  the bath   4. Walking between rooms   5. Putting on socks/shoes   6. Squatting    7. Lifting an object, like a bag of groceries from the floor   8. Performing light activities around your home   9. Performing heavy activities around your home   10. Getting into/out of a car   11. Walking 2 blocks   12. Walking 1 mile   13. Going up/down 10 stairs (1 flight)   14. Standing for 1 hour   15.  sitting for 1 hour   16. Running on even ground   17. Running on uneven ground   18. Making sharp turns while running fast   19. Hopping    20. Rolling over in bed   Score total:  1/80     COGNITION: Overall cognitive status: Within functional limits for tasks  assessed     SENSATION: WFL  EDEMA:  No noticeable edema  POSTURE: No Significant postural limitations  PALPATION: No unexpected tenderness to palpation   LOWER EXTREMITY ROM:  Passive ROM Right eval Left eval Left   Hip flexion  78 108  Hip extension     Hip abduction     Hip adduction     Hip internal rotation  5 8  Hip external rotation  Not performed 2nd to protocol  36  Knee flexion     Knee extension     Ankle dorsiflexion     Ankle plantarflexion     Ankle inversion     Ankle eversion      (Blank rows = not tested)  LOWER EXTREMITY MMT:  MMT Right 8/1 Left 8/1  Hip flexion 42.1 19.5  Hip extension    Hip abduction 36.4 33.0  Hip adduction    Hip internal rotation    Hip external rotation    Knee flexion    Knee extension 46.2 46.4  Ankle dorsiflexion    Ankle plantarflexion    Ankle inversion    Ankle eversion     (Blank rows = not tested) Not tested 2nd to recent surgery    FUNCTIONAL TESTS:  Using hands to stand form a surface   GAIT: Using 2 crutches. Limited weight bearing at this time                                                                                                                               TREATMENT DATE:  04/01/24 Elliptical 3 minutes for dynamic warm up Sidelying hip abduction 2 x 10 Prone hip extension 2 x 10 LAQ 4 #  2x15 with 5 second holds Step up 6 inch 2 x 15 Lateral step up 6 inch 2 x 15 Lateral step down 4 inch 2 x 10 SLS on airex 4 x 30 second holds Standing hip hinge 2 x 10 with SPC on back for mechanics Bridge 3x10 DKTC with heels on green ball 2 x 10 with 5  second holds  8/12 There-ex: Nu-step L5 5 min  LAQ 4 # 3 2x15 Seated HSC GTB 2x15 Side stepping along rail x3 laps  Neuro-re-ed:  Bridge 3x10 Supine clamshell 2x15 green  Sidelying clam 3x10ea  TherAct: Step down 2x15 4 inch  Squats 2x15 Step ups 6 2x15 fwd   Manual: STM to L gluteal mm   8/7 There-ex: Nu-step L5 5 min   LAQ 3 # 3 2x15 Seated HSC GTB 2x15 Side stepping along rail x2 laps  Neuro-re-ed:  Bridge 3x10 Supine clamshell 2x15 green  Sidelying clam 2x10ea  TherAct: Step down 2x15 4 inch  Squats 2x15 Standing marches on airex 2x10ea Step ups 6 2x15 fwd    8/5 There-ex: Nu-step L5 5 min  LAQ 3 # 3 2x15 Seated HSC GTB 2x15  Neuro-re-ed:  Bridge 3x12  Supine clamshell3x12 green  Sidelying clam 2x15ea  TherAct: Step down 3x10 2 inch  Squats 2x10 Standing marches 2x10ea Step ups 6 2x10 fwd    8/1 There-ex:  strength testing.  LAQ 3 #  Nu-step L4 5 min   Neuro-re-ed:  Bridge 3x12 with cuing for breathing  Supine clamshell with breathing 3x12 green   TherAct: Step down 3x10 2 inch      PATIENT EDUCATION:  Education details: HEP, symptom management  Person educated: Patient Education method: Explanation, Demonstration, Tactile cues, Verbal cues, and Handouts Education comprehension: verbalized understanding, returned demonstration, verbal cues required, tactile cues required, and needs further education  HOME EXERCISE PROGRAM: Access Code: [email or web address] URL: [email or web address] Date: 01/09/2024 Prepared by: Alm Don  Exercises - Supine Heel Slide with Strap  - 3 x daily - 7 x weekly - 3 sets - 5 reps - 5 hold - Supine Quad Set  - 4-5 x daily - 7 x weekly - 3 sets - 10 reps - Seated Ankle Pumps  - 1 x daily - 6 x weekly - 3 sets - 10 reps - Supine Transversus Abdominis Bracing - Hands on Stomach  - 3-4 x daily - 7 x weekly - 3 sets - 10 reps  ASSESSMENT:  CLINICAL IMPRESSION: Began session elliptical for dynamic warm up and conditioning. Some cueing required for positioning and mechanics with good/fair carry over. Continued with glute and functional strengthening. Moderate fatigue at EOS. Patient will continue to benefit from physical therapy in order to improve function and reduce impairment.    Eval: Patient is a 48 year old female  status post glute med repair and left trochanteric bursa bursectomy.  She presents with expected limitations in motion, strength, and general functional mobility.  She is currently walking with 2 crutches.  She is having pain but it is improving.  She would benefit from skilled therapy to improve her ability to ambulate and perform daily tasks.  OBJECTIVE IMPAIRMENTS: Abnormal gait, decreased activity tolerance, decreased endurance, decreased mobility, difficulty walking, decreased ROM, decreased strength, increased edema, and pain.   ACTIVITY LIMITATIONS: carrying, lifting, bending, sitting, standing, squatting, sleeping, stairs, transfers, bed mobility, bathing, hygiene/grooming, and locomotion level  PARTICIPATION LIMITATIONS: meal prep, cleaning, laundry, driving, shopping, community activity, and occupation  PERSONAL FACTORS: 1-2 comorbidities: anxiety are also affecting patient's functional outcome.   REHAB POTENTIAL: Good  CLINICAL DECISION MAKING: Evolving/moderate complexity Pain limiting general function   EVALUATION COMPLEXITY: Moderate   GOALS: Goals reviewed with patient? Yes  SHORT TERM GOALS: Target date: 02/06/2024   Patient will get in and out of bed independently without increased pain Baseline: Goal status: IN PROGRESS 6/11  2.  Patient will progress to ambulation without crutches as tolerated Baseline:  Goal status: MET (uses [facility] 6/11)  3.  Patient will demonstrate good gluteal contraction without increased pain Baseline:  Goal status: MET 6/11  4.  Patient will increase passive range of motion on the left into flexion to 120 degrees Baseline:  Goal status: achieved and updated   5.  Patient will be independent with basic HEP Baseline:  Goal status: MET 6/11   LONG TERM GOALS: Target date: 03/05/2024    Patient will go up and down 2 steps with reciprocal gait pattern in order to get in and out of her house Baseline:  Goal status progressing stair  training 8/1  2.  Patient will ambulate community distances without increased pain Baseline:  Goal status: mild pain 8/1  3.  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  Patient will have complete exercise program to promote further strengthening and general functional mobility Baseline:  Goal status: progressing  L   PLAN:  PT FREQUENCY: 2x/week  PT DURATION: 8 weeks  PLANNED INTERVENTIONS: 97110-Therapeutic exercises, 97530- Therapeutic activity, [id number]- Neuromuscular re-education, 97535- Self Care, [id number]- Manual therapy, [id number]- Gait training, [phone]- Aquatic Therapy, 97014- Electrical stimulation (unattended), 97035- Ultrasound, Patient/Family education, Stair training, Taping, Dry Needling, DME instructions, Cryotherapy, and Moist heat   PLAN FOR NEXT SESSION: Continue with passive range of motion.  Follow glutes Med repair protocol.  Progress gait training as tolerated.   Prentice RAMAN Jayquon Theiler, PT, DPT 04/01/2024, 8:04 AM

## 2024-04-06 ENCOUNTER — Encounter (HOSPITAL_BASED_OUTPATIENT_CLINIC_OR_DEPARTMENT_OTHER): Payer: Self-pay

## 2024-04-06 ENCOUNTER — Ambulatory Visit (HOSPITAL_BASED_OUTPATIENT_CLINIC_OR_DEPARTMENT_OTHER): Payer: Self-pay

## 2024-04-06 DIAGNOSIS — M6281 Muscle weakness (generalized): Secondary | ICD-10-CM

## 2024-04-06 DIAGNOSIS — M25552 Pain in left hip: Secondary | ICD-10-CM

## 2024-04-06 DIAGNOSIS — M7062 Trochanteric bursitis, left hip: Secondary | ICD-10-CM

## 2024-04-06 NOTE — Therapy (Signed)
 OUTPATIENT PHYSICAL THERAPY LOWER EXTREMITY treatment   Patient Name: Jennifer Noble MRN: [id number] DOB:03/27/1976, 48 y.o., female Today's Date: 04/06/2024  END OF SESSION:  PT End of Session - 04/06/24 0900     Visit Number 16    Number of Visits 32    Date for PT Re-Evaluation 05/14/24    Authorization Type CAFA    PT Start Time 0845    PT Stop Time 0930    PT Time Calculation (min) 45 min    Activity Tolerance Patient tolerated treatment well    Behavior During Therapy WFL for tasks assessed/performed                       Past Medical History:  Diagnosis Date   Anxiety    Depression    Dysuria    Hemorrhoid    High cholesterol    Past Surgical History:  Procedure Laterality Date   GLUTEUS MINIMUS REPAIR Left 01/05/2024   Procedure: REPAIR, TENDON, GLUTEUS MINIMUS;  Surgeon: Genelle Standing, MD;  Location: [facility];  Service: Orthopedics;  Laterality: Left;  LEFT GLUTEUS MEDIUS REPAIR AND POSSIBLE COLLAGEN PATCH AUGMENTATION   HEMORRHOID SURGERY     Patient Active Problem List   Diagnosis Date Noted   Tear of left gluteus minimus tendon 01/05/2024   Vasomotor symptoms due to menopause 12/03/2022   Headache 05/07/2016   Depression 08/17/2015   Insomnia 08/17/2015   Elevated lipids 04/08/2013   Family history of early CAD 04/08/2013   Hemorrhoid    Dysuria     PCP: Haze Arlington NP  REFERRING PROVIDER: DR Elia Genelle   REFERRING DIAG:  Left glut repair   THERAPY DIAG:  Pain in left hip  Trochanteric bursitis of left hip  Muscle weakness (generalized)  Rationale for Evaluation and Treatment: Rehabilitation  ONSET DATE: DOS 01/05/2024  SUBJECTIVE:   SUBJECTIVE STATEMENT: Interpretor present.  Pt states hip is getting better. Some knee/hamstring pain. Some pain with walking for a while. Doesn't use stairs that much.    Eval: Patient has a long history of left lateral hip and low back pain.  She  was found to have a glute med tear.  On 01/05/2024 she had a glute med repair as well as a left trochanteric bursectomy.  At this time she is having pain when she is standing and walking.  She is using 2 crutches.  PERTINENT HISTORY: Anxiety, depression  PAIN:  Are you having pain? Yes: NPRS scale: 0/10 right now  Pain location: left hip Pain description: aching  Aggravating factors: Standing/ walking/ night time  Relieving factors: pain medication   PRECAUTIONS: None  RED FLAGS: None   WEIGHT BEARING RESTRICTIONS: Yes WBAT   FALLS:  Has patient fallen in last 6 months? 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  DIAGNOSTIC FINDINGS:  Nothing post op   PATIENT SURVEYS:  LEFS  Extreme difficulty/unable (0), Quite a bit of difficulty (1), Moderate difficulty (2), Little difficulty (3), No difficulty (4) Survey date:    Any of your usual work, housework or school activities   2. Usual hobbies, recreational or sporting activities   3. Getting into/out  of the bath   4. Walking between rooms   5. Putting on socks/shoes   6. Squatting    7. Lifting an object, like a bag of groceries from the floor   8. Performing light activities around your home   9. Performing heavy activities around your home   10. Getting into/out of a car   11. Walking 2 blocks   12. Walking 1 mile   13. Going up/down 10 stairs (1 flight)   14. Standing for 1 hour   15.  sitting for 1 hour   16. Running on even ground   17. Running on uneven ground   18. Making sharp turns while running fast   19. Hopping    20. Rolling over in bed   Score total:  1/80     COGNITION: Overall cognitive status: Within functional limits for  tasks assessed     SENSATION: WFL  EDEMA:  No noticeable edema  POSTURE: No Significant postural limitations  PALPATION: No unexpected tenderness to palpation   LOWER EXTREMITY ROM:  Passive ROM Right eval Left eval Left   Hip flexion  78 108  Hip extension     Hip abduction     Hip adduction     Hip internal rotation  5 8  Hip external rotation  Not performed 2nd to protocol  36  Knee flexion     Knee extension     Ankle dorsiflexion     Ankle plantarflexion     Ankle inversion     Ankle eversion      (Blank rows = not tested)  LOWER EXTREMITY MMT:  MMT Right 8/1 Left 8/1  Hip flexion 42.1 19.5  Hip extension    Hip abduction 36.4 33.0  Hip adduction    Hip internal rotation    Hip external rotation    Knee flexion    Knee extension 46.2 46.4  Ankle dorsiflexion    Ankle plantarflexion    Ankle inversion    Ankle eversion     (Blank rows = not tested) Not tested 2nd to recent surgery    FUNCTIONAL TESTS:  Using hands to stand form a surface   GAIT: Using 2 crutches. Limited weight bearing at this time                                                                                                                               TREATMENT DATE:   04/05/24 Nu-step x57min L4 Long sit HSS 30sec x2ea Sidelying hip abduction 3 x 10bil Step up 8 inch 2 x 15 Lateral step up 6 inch 2 x 15 Lateral step down 6 inch 2 x 10 SLS on airex 4 x 30 second holds Standing hip hinge 2 x 10 with 1# bar on back for mechanics Single leg bridge 2x10bil     04/01/24 Elliptical 3 minutes for dynamic warm up Sidelying hip abduction 2 x 10 Prone hip  extension 2 x 10 LAQ 4 #  2x15 with 5 second holds Step up 6 inch 2 x 15 Lateral step up 6 inch 2 x 15 Lateral step down 4 inch 2 x 10 SLS on airex 4 x 30 second holds Standing hip hinge 2 x 10 with SPC on back for mechanics Bridge 3x10 DKTC with heels on green ball 2 x 10 with 5 second holds  8/12 There-ex: Nu-step  L5 5 min  LAQ 4 # 3 2x15 Seated HSC GTB 2x15 Side stepping along rail x3 laps  Neuro-re-ed:  Bridge 3x10 Supine clamshell 2x15 green  Sidelying clam 3x10ea  TherAct: Step down 2x15 4 inch  Squats 2x15 Step ups 6 2x15 fwd   Manual: STM to L gluteal mm   8/7 There-ex: Nu-step L5 5 min  LAQ 3 # 3 2x15 Seated HSC GTB 2x15 Side stepping along rail x2 laps  Neuro-re-ed:  Bridge 3x10 Supine clamshell 2x15 green  Sidelying clam 2x10ea  TherAct: Step down 2x15 4 inch  Squats 2x15 Standing marches on airex 2x10ea Step ups 6 2x15 fwd    8/5 There-ex: Nu-step L5 5 min  LAQ 3 # 3 2x15 Seated HSC GTB 2x15  Neuro-re-ed:  Bridge 3x12  Supine clamshell3x12 green  Sidelying clam 2x15ea  TherAct: Step down 3x10 2 inch  Squats 2x10 Standing marches 2x10ea Step ups 6 2x10 fwd    8/1 There-ex:  strength testing.  LAQ 3 #  Nu-step L4 5 min   Neuro-re-ed:  Bridge 3x12 with cuing for breathing  Supine clamshell with breathing 3x12 green   TherAct: Step down 3x10 2 inch      PATIENT EDUCATION:  Education details: HEP, symptom management  Person educated: Patient Education method: Explanation, Demonstration, Tactile cues, Verbal cues, and Handouts Education comprehension: verbalized understanding, returned demonstration, verbal cues required, tactile cues required, and needs further education  HOME EXERCISE PROGRAM: Access Code: [email or web address] URL: [email or web address] Date: 01/09/2024 Prepared by: Alm Don  Exercises - Supine Heel Slide with Strap  - 3 x daily - 7 x weekly - 3 sets - 5 reps - 5 hold - Supine Quad Set  - 4-5 x daily - 7 x weekly - 3 sets - 10 reps - Seated Ankle Pumps  - 1 x daily - 6 x weekly - 3 sets - 10 reps - Supine Transversus Abdominis Bracing - Hands on Stomach  - 3-4 x daily - 7 x weekly - 3 sets - 10 reps  ASSESSMENT:  CLINICAL IMPRESSION: Able to progress hip strengthening including additional  repetitions of s/l hip abduction and single leg bridges. Increased height of fwd step ups with good tolerance though she did report some fatigue. Felt significant quad fatigue with increased height lateral step downs, but denied pain or discomfort. Worked on SL stability and hip hinging without complaint. Pt requires intermittent UE support for SLS on compliant surface due to instability. Will continue to progress as tolerated. Planning for d/c at end of month if appropriate.    Eval: Patient is a 48 year old female status post glute med repair and left trochanteric bursa bursectomy.  She presents with expected limitations in motion, strength, and general functional mobility.  She is currently walking with 2 crutches.  She is having pain but it is improving.  She would benefit from skilled therapy to improve her ability to ambulate and perform daily tasks.  OBJECTIVE IMPAIRMENTS: Abnormal gait, decreased activity tolerance, decreased endurance, decreased mobility, difficulty walking, decreased ROM, decreased strength, increased  edema, and pain.   ACTIVITY LIMITATIONS: carrying, lifting, bending, sitting, standing, squatting, sleeping, stairs, transfers, bed mobility, bathing, hygiene/grooming, and locomotion level  PARTICIPATION LIMITATIONS: meal prep, cleaning, laundry, driving, shopping, community activity, and occupation  PERSONAL FACTORS: 1-2 comorbidities: anxiety are also affecting patient's functional outcome.   REHAB POTENTIAL: Good  CLINICAL DECISION MAKING: Evolving/moderate complexity Pain limiting general function   EVALUATION COMPLEXITY: Moderate   GOALS: Goals reviewed with patient? Yes  SHORT TERM GOALS: Target date: 02/06/2024   Patient will get in and out of bed independently without increased pain Baseline: Goal status: IN PROGRESS 6/11  2.  Patient will progress to ambulation without crutches as tolerated Baseline:  Goal status: MET (uses [facility] 6/11)  3.  Patient  will demonstrate good gluteal contraction without increased pain Baseline:  Goal status: MET 6/11  4.  Patient will increase passive range of motion on the left into flexion to 120 degrees Baseline:  Goal status: achieved and updated   5.  Patient will be independent with basic HEP Baseline:  Goal status: MET 6/11   LONG TERM GOALS: Target date: 03/05/2024    Patient will go up and down 2 steps with reciprocal gait pattern in order to get in and out of her house Baseline:  Goal status progressing stair training 8/1  2.  Patient will ambulate community distances without increased pain Baseline:  Goal status: mild pain 8/1  3.  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  Patient will have complete exercise program to promote further strengthening and general functional mobility Baseline:  Goal status: progressing  L   PLAN:  PT FREQUENCY: 2x/week  PT DURATION: 8 weeks  PLANNED INTERVENTIONS: 97110-Therapeutic exercises, 97530- Therapeutic activity, [id number]- Neuromuscular re-education, 97535- Self Care, 02859- Manual therapy, [id number]- Gait training, [phone]- Aquatic Therapy, 97014- Electrical stimulation (unattended), 97035- Ultrasound, Patient/Family education, Stair training, Taping, Dry Needling, DME instructions, Cryotherapy, and Moist heat   PLAN FOR NEXT SESSION: Continue with passive range of motion.  Follow glutes Med repair protocol.  Progress gait training as tolerated.   Asberry BRAVO Dishawn Bhargava, PTA 04/06/2024, 11:13 AM

## 2024-04-08 ENCOUNTER — Encounter (HOSPITAL_BASED_OUTPATIENT_CLINIC_OR_DEPARTMENT_OTHER): Payer: Self-pay | Admitting: Physical Therapy

## 2024-04-08 ENCOUNTER — Ambulatory Visit (HOSPITAL_BASED_OUTPATIENT_CLINIC_OR_DEPARTMENT_OTHER): Payer: Self-pay | Admitting: Physical Therapy

## 2024-04-08 DIAGNOSIS — M6281 Muscle weakness (generalized): Secondary | ICD-10-CM

## 2024-04-08 DIAGNOSIS — M7062 Trochanteric bursitis, left hip: Secondary | ICD-10-CM

## 2024-04-08 DIAGNOSIS — M25552 Pain in left hip: Secondary | ICD-10-CM

## 2024-04-08 NOTE — Therapy (Signed)
 OUTPATIENT PHYSICAL THERAPY LOWER EXTREMITY treatment   Patient Name: Jennifer Noble MRN: [id number] DOB:02-Dec-1975, 48 y.o., female Today's Date: 04/08/2024  PHYSICAL THERAPY DISCHARGE SUMMARY  Visits from Start of Care: 17  Current functional level related to goals / functional outcomes: See below   Remaining deficits: See below   Education / Equipment: See below   Patient agrees to discharge. Patient goals were met. Patient is being discharged due to being pleased with the current functional level.   END OF SESSION:  PT End of Session - 04/08/24 1148     Visit Number 17    Number of Visits 32    Date for PT Re-Evaluation 05/14/24    Authorization Type CAFA    PT Start Time 1148    PT Stop Time 1217    PT Time Calculation (min) 29 min    Activity Tolerance Patient tolerated treatment well    Behavior During Therapy WFL for tasks assessed/performed                       Past Medical History:  Diagnosis Date   Anxiety    Depression    Dysuria    Hemorrhoid    High cholesterol    Past Surgical History:  Procedure Laterality Date   GLUTEUS MINIMUS REPAIR Left 01/05/2024   Procedure: REPAIR, TENDON, GLUTEUS MINIMUS;  Surgeon: Genelle Standing, MD;  Location: [facility];  Service: Orthopedics;  Laterality: Left;  LEFT GLUTEUS MEDIUS REPAIR AND POSSIBLE COLLAGEN PATCH AUGMENTATION   HEMORRHOID SURGERY     Patient Active Problem List   Diagnosis Date Noted   Tear of left gluteus minimus tendon 01/05/2024   Vasomotor symptoms due to menopause 12/03/2022   Headache 05/07/2016   Depression 08/17/2015   Insomnia 08/17/2015   Elevated lipids 04/08/2013   Family history of early CAD 04/08/2013   Hemorrhoid    Dysuria     PCP: Haze Arlington NP  REFERRING PROVIDER: DR Elia Genelle   REFERRING DIAG:  Left glut repair   THERAPY DIAG:  Pain in left hip  Trochanteric bursitis of left hip  Muscle weakness  (generalized)  Rationale for Evaluation and Treatment: Rehabilitation  ONSET DATE: DOS 01/05/2024  SUBJECTIVE:   SUBJECTIVE STATEMENT: Interpretor present.  Pt states her hip is feeling good. Wants to try the elliptical again. Does not feel limited in life by her hip. Feels that she can do HEP. Currently at 95% functional status. No hip pain. Unrestricted with standing. Very minimal hip symptoms after walking greater than 30 minutes.  Eval: Patient has a long history of left lateral hip and low back pain.  She was found to have a glute med tear.  On 01/05/2024 she had a glute med repair as well as a left trochanteric bursectomy.  At this time she is having pain when she is standing and walking.  She is using 2 crutches.  PERTINENT HISTORY: Anxiety, depression  PAIN:  Are you having pain? Yes: NPRS scale: 0/10 right now  Pain location: left hip Pain description: aching  Aggravating factors: Standing/ walking/ night time  Relieving factors: pain medication   PRECAUTIONS: None  RED FLAGS: None   WEIGHT BEARING RESTRICTIONS: Yes WBAT   FALLS:  Has patient fallen in last 6 months? 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  DIAGNOSTIC FINDINGS:  Nothing post op   PATIENT SURVEYS:  LEFS  Extreme difficulty/unable (0), Quite a bit of difficulty (1), Moderate difficulty (2), Little difficulty (3), No difficulty (4) Survey date:    Any of your usual work, housework or school activities   2. Usual hobbies, recreational or sporting activities   3. Getting into/out of the bath   4. Walking between rooms   5. Putting on socks/shoes   6. Squatting    7. Lifting an object, like a bag of groceries from the  floor   8. Performing light activities around your home   9. Performing heavy activities around your home   10. Getting into/out of a car   11. Walking 2 blocks   12. Walking 1 mile   13. Going up/down 10 stairs (1 flight)   14. Standing for 1 hour   15.  sitting for 1 hour   16. Running on even ground   17. Running on uneven ground   18. Making sharp turns while running fast   19. Hopping    20. Rolling over in bed   Score total:  1/80     COGNITION: Overall cognitive status: Within functional limits for tasks assessed     SENSATION: WFL  EDEMA:  No noticeable edema  POSTURE: No Significant postural limitations  PALPATION: No unexpected tenderness to palpation   LOWER EXTREMITY ROM:  Passive ROM Right eval Left eval Left   Hip flexion  78 108  Hip extension     Hip abduction     Hip adduction     Hip internal rotation  5 8  Hip external rotation  Not performed 2nd to protocol  36  Knee flexion     Knee extension     Ankle dorsiflexion     Ankle plantarflexion     Ankle inversion     Ankle eversion      (Blank rows = not tested)  LOWER EXTREMITY MMT: 8/21: deferred HHD due to in use  MMT Right 8/1 Left 8/1 Right 04/08/24 Left 04/08/24  Hip flexion 42.1 19.5 5/5 5/5  Hip extension   4+/5 5/5  Hip abduction 36.4 33.0 4+/5 4+/5  Hip adduction      Hip internal rotation      Hip external rotation      Knee flexion   5/5 5/5  Knee extension 46.2 46.4 5/5 5/5  Ankle dorsiflexion      Ankle plantarflexion      Ankle inversion      Ankle eversion       (Blank rows = not tested) Not tested 2nd to recent surgery    FUNCTIONAL TESTS:  Using hands to stand form a surface   GAIT: Using 2 crutches. Limited weight bearing at this time  Reassessment 04/08/24:  Stairs 7 inch alternating, mechanics WFL  TREATMENT DATE:   04/08/24 Elliptical 5 minutes for dynamic warm up Reassessment Squats 1 x 10 Discussion and update of HEP  04/05/24 Nu-step x103min L4 Long sit HSS 30sec x2ea Sidelying hip abduction 3 x 10bil Step up 8 inch 2 x 15 Lateral step up 6 inch 2 x 15 Lateral step down 6 inch 2 x 10 SLS on airex 4 x 30 second holds Standing hip hinge 2 x 10 with 1# bar on back for mechanics Single leg bridge 2x10bil     04/01/24 Elliptical 3 minutes for dynamic warm up Sidelying hip abduction 2 x 10 Prone hip extension 2 x 10 LAQ 4 #  2x15 with 5 second holds Step up 6 inch 2 x 15 Lateral step up 6 inch 2 x 15 Lateral step down 4 inch 2 x 10 SLS on airex 4 x 30 second holds Standing hip hinge 2 x 10 with SPC on back for mechanics Bridge 3x10 DKTC with heels on green ball 2 x 10 with 5 second holds  8/12 There-ex: Nu-step L5 5 min  LAQ 4 # 3 2x15 Seated HSC GTB 2x15 Side stepping along rail x3 laps  Neuro-re-ed:  Bridge 3x10 Supine clamshell 2x15 green  Sidelying clam 3x10ea  TherAct: Step down 2x15 4 inch  Squats 2x15 Step ups 6 2x15 fwd   Manual: STM to L gluteal mm   8/7 There-ex: Nu-step L5 5 min  LAQ 3 # 3 2x15 Seated HSC GTB 2x15 Side stepping along rail x2 laps  Neuro-re-ed:  Bridge 3x10 Supine clamshell 2x15 green  Sidelying clam 2x10ea  TherAct: Step down 2x15 4 inch  Squats 2x15 Standing marches on airex 2x10ea Step ups 6 2x15 fwd    8/5 There-ex: Nu-step L5 5 min  LAQ 3 # 3 2x15 Seated HSC GTB 2x15  Neuro-re-ed:  Bridge 3x12  Supine clamshell3x12 green  Sidelying clam 2x15ea  TherAct: Step down 3x10 2 inch  Squats 2x10 Standing marches 2x10ea Step ups 6 2x10 fwd    8/1 There-ex:  strength testing.  LAQ 3 #  Nu-step L4 5 min   Neuro-re-ed:  Bridge 3x12 with cuing for breathing  Supine clamshell with breathing 3x12 green   TherAct: Step down 3x10 2 inch      PATIENT EDUCATION:  Education details: HEP, symptom management  04/08/24: reassessment findings, POC, HEP, returning to PT if needed Person educated: Patient Education method: Explanation, Demonstration, Tactile cues, Verbal cues, and Handouts Education comprehension: verbalized understanding, returned demonstration, verbal cues required, tactile cues required, and needs further education  HOME EXERCISE PROGRAM: Access Code: [email or web address] URL: [email or web address] Date: 01/09/2024 Prepared by: Alm Don  Exercises - Supine Heel Slide with Strap  - 3 x daily - 7 x weekly - 3 sets - 5 reps - 5 hold - Supine Quad Set  - 4-5 x daily - 7 x weekly - 3 sets - 10 reps - Seated Ankle Pumps  - 1 x daily - 6 x weekly - 3 sets - 10 reps - Supine Transversus Abdominis Bracing - Hands on Stomach  - 3-4 x daily - 7 x weekly - 3 sets - 10 reps  ASSESSMENT:  CLINICAL IMPRESSION: Patient has met 5/5 short term goals and 4/4 long term goals with ability to complete HEP and improvement in symptoms, strength, ROM, activity tolerance, gait, balance, and functional mobility. Patient stating mild endurance deficits with ambulation. Discussed continued HEP, exercise form and updated HEP. Educated on returning to PT if needed and reassessment  findings. Mild abductor weakness but equal bilaterally, extension strength L>R. Patient agreeable to continue with HEP and returning to PT if needed. Patient discharged from PT    Eval: Patient is a 48 year old female status post glute med repair and left trochanteric bursa bursectomy.  She presents with expected limitations in motion, strength, and general functional mobility.  She is currently walking with 2 crutches.  She is having pain but it is improving.  She would benefit from skilled therapy to improve her ability to ambulate and perform daily tasks.  OBJECTIVE IMPAIRMENTS: Abnormal gait, decreased activity tolerance, decreased endurance, decreased mobility, difficulty walking, decreased ROM, decreased strength,  increased edema, and pain.   ACTIVITY LIMITATIONS: carrying, lifting, bending, sitting, standing, squatting, sleeping, stairs, transfers, bed mobility, bathing, hygiene/grooming, and locomotion level  PARTICIPATION LIMITATIONS: meal prep, cleaning, laundry, driving, shopping, community activity, and occupation  PERSONAL FACTORS: 1-2 comorbidities: anxiety are also affecting patient's functional outcome.   REHAB POTENTIAL: Good  CLINICAL DECISION MAKING: Evolving/moderate complexity Pain limiting general function   EVALUATION COMPLEXITY: Moderate   GOALS: Goals reviewed with patient? Yes  SHORT TERM GOALS: Target date: 02/06/2024   Patient will get in and out of bed independently without increased pain Baseline: Goal status: IN PROGRESS 6/11  2.  Patient will progress to ambulation without crutches as tolerated Baseline:  Goal status: MET (uses [facility] 6/11)  3.  Patient will demonstrate good gluteal contraction without increased pain Baseline:  Goal status: MET 6/11  4.  Patient will increase passive range of motion on the left into flexion to 120 degrees Baseline:  Goal status: achieved and updated   5.  Patient will be independent with basic HEP Baseline:  Goal status: MET 6/11   LONG TERM GOALS: Target date: 03/05/2024    Patient will go up and down 2 steps with reciprocal gait pattern in order to get in and out of her house Baseline:  Goal status MET  2.  Patient will ambulate community distances without increased pain Baseline:  Goal status: MET  3.  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  Patient will have complete exercise program to promote further strengthening and general functional mobility Baseline:  Goal status: MET L   PLAN:  PT FREQUENCY: 2x/week  PT DURATION: 8 weeks  PLANNED INTERVENTIONS: 97110-Therapeutic exercises,  97530- Therapeutic activity, [id number]- Neuromuscular re-education, 97535- Self Care, [id number]- Manual therapy, [id number]- Gait training, [phone]- Aquatic Therapy, 97014- Electrical stimulation (unattended), 97035- Ultrasound, Patient/Family education, Stair training, Taping, Dry Needling, DME instructions, Cryotherapy, and Moist heat   PLAN FOR NEXT SESSION: n/a   Prentice GORMAN Stains, PT, DPT 04/08/2024, 12:21 PM

## 2024-04-13 ENCOUNTER — Encounter (HOSPITAL_BASED_OUTPATIENT_CLINIC_OR_DEPARTMENT_OTHER): Payer: Self-pay | Admitting: Physical Therapy

## 2024-04-15 ENCOUNTER — Encounter (HOSPITAL_BASED_OUTPATIENT_CLINIC_OR_DEPARTMENT_OTHER): Payer: Self-pay

## 2024-04-20 ENCOUNTER — Telehealth: Payer: Self-pay

## 2024-04-20 NOTE — Telephone Encounter (Signed)
 Copied from CRM [id number]. Topic: Clinical - Medical Advice >> Apr 20, 2024  3:03 PM Emylou G wrote: Reason for CRM: Patient looking to be seen OR referral.. wasn't sure which because she does need her insurance to cover.. ( we need translator ) She is experiencing her eyes crust, itching, and scar in her eye to be removed?

## 2024-04-21 NOTE — Telephone Encounter (Signed)
 I s/w patient. OC/CAFA does not cover eye surgeries. I recommend she continue with the current eye doctor she is seeing and work out a Insurance claims handler.

## 2024-06-07 ENCOUNTER — Other Ambulatory Visit: Payer: Self-pay

## 2024-06-17 ENCOUNTER — Ambulatory Visit: Payer: Self-pay

## 2024-06-17 ENCOUNTER — Telehealth: Payer: Self-pay | Admitting: Nurse Practitioner

## 2024-06-17 ENCOUNTER — Other Ambulatory Visit: Payer: Self-pay

## 2024-06-17 DIAGNOSIS — K089 Disorder of teeth and supporting structures, unspecified: Secondary | ICD-10-CM

## 2024-06-17 NOTE — Telephone Encounter (Signed)
 Referral sent

## 2024-06-17 NOTE — Telephone Encounter (Signed)
 FYI Only or Action Required?: FYI only for provider: appointment scheduled on 06/18/24 at [facility] urgent care.  Patient was last seen in primary care on 01/02/2024 by Theotis Haze ORN, NP.  Called Nurse Triage reporting Urinary Frequency.  Symptoms began today.  Interventions attempted: Nothing.  Symptoms are: gradually worsening.  Triage Disposition: See Physician Within 24 Hours  Patient/caregiver understands and will follow disposition?: Yes  Summary: Needs diabetes check, no appt soon enough   Reason for Triage: Pt wants to have her A1C checked, needs spanish interpreter. No appt soon enough           Nurse called Interpreter #Pablo [phone]   Pt stressed, had urgently Reason for Disposition  Unusual vaginal discharge (e.g., bad smelling, yellow, green, or foamy-white)  Answer Assessment - Initial Assessment Questions 1. SEVERITY: How bad is the pain?  (e.g., Scale 1-10; mild, moderate, or severe)     Mild to moderate  2. FREQUENCY: How many times have you had painful urination today?      Each urination 3. PATTERN: Is pain present every time you urinate or just sometimes?      Constant  4. ONSET: When did the painful urination start?      today 5. FEVER: Do you have a fever? If Yes, ask: What is your temperature, how was it measured, and when did it start?     no 6. PAST UTI: Have you had a urine infection before? If Yes, ask: When was the last time? and What happened that time?      na 7. CAUSE: What do you think is causing the painful urination?  (e.g., UTI, scratch, Herpes sore)     unknown 8. OTHER SYMPTOMS: Do you have any other symptoms? (e.g., blood in urine, flank pain, genital sores, urgency, vaginal discharge)     Abd feels hot inside- below abd  9. PREGNANCY: Is there any chance you are pregnant? When was your last menstrual period?     Na  Pt stated this morning she was stressed/mentally and noticed pt was having urinary  frequency and urgency.  Pt checked Blood sugar and it was 143 before eating: pt is unsure if this is related to pre-diabetes and now be diabetes or if something else is going on.  Nurse scheduled pt for tomorrow with [facility] urgent due to no available appts.  Protocols used: Urination Pain - Female-A-AH

## 2024-06-17 NOTE — Telephone Encounter (Signed)
 Copied from CRM [id number]. Topic: Referral - Request for Referral >> Jun 17, 2024  1:59 PM Rosaria BRAVO wrote:  Did the patient discuss referral with their provider in the last year? Yes (If No - schedule appointment) (If Yes - send message)  Appointment offered? Yes  Type of order/referral and detailed reason for visit: Dentist  Has a cracked tooth  Preference of office, provider, location: Local, needs spanish interpreter  If referral order, have you been seen by this specialty before? No (If Yes, this issue or another issue? When? Where?  Can we respond through MyChart? No

## 2024-06-18 ENCOUNTER — Other Ambulatory Visit: Payer: Self-pay

## 2024-06-18 ENCOUNTER — Ambulatory Visit
Admission: RE | Admit: 2024-06-18 | Discharge: 2024-06-18 | Disposition: A | Discharge status: Home / Self Care | Payer: Self-pay | Attending: Physician Assistant | Admitting: Physician Assistant

## 2024-06-18 VITALS — BP 134/83 | HR 82 | Temp 98.0°F | Resp 18 | Ht 62.0 in | Wt 177.0 lb

## 2024-06-18 DIAGNOSIS — R739 Hyperglycemia, unspecified: Secondary | ICD-10-CM | POA: Insufficient documentation

## 2024-06-18 DIAGNOSIS — R300 Dysuria: Secondary | ICD-10-CM | POA: Insufficient documentation

## 2024-06-18 LAB — POCT URINE DIPSTICK
Bilirubin, UA: NEGATIVE
Glucose, UA: NEGATIVE mg/dL
Ketones, POC UA: NEGATIVE mg/dL
Nitrite, UA: NEGATIVE
POC PROTEIN,UA: NEGATIVE
Spec Grav, UA: 1.025 (ref 1.010–1.025)
Urobilinogen, UA: 0.2 U/dL
pH, UA: 5.5 (ref 5.0–8.0)

## 2024-06-18 LAB — GLUCOSE, POCT (MANUAL RESULT ENTRY): POC Glucose: 129 mg/dL — AB (ref 70–99)

## 2024-06-18 MED ORDER — NITROFURANTOIN MONOHYD MACRO 100 MG PO CAPS: 100.0000 mg | ORAL_CAPSULE | Freq: Two times a day (BID) | ORAL | 0 refills | Status: DC

## 2024-06-18 NOTE — ED Triage Notes (Signed)
 Spanish interpreter used for clinical intake: Eric [phone].  Pt presents with a chief complaint of urinary frequency x 4 days. States she feels a burning sensation in lower abdomen. Currently denies pain, only voices discomfort in triage. Pt reports concerns about her blood sugar. Obtained yesterday and it was 143. Took BS as she felt shaky at work.

## 2024-06-18 NOTE — Discharge Instructions (Addendum)
 Based on your symptoms and results of the urinalysis I believe you have a UTI I recommend the following:  I have sent in a script for Macrobid 100 mg to be taken by mouth twice per day for 5 days.  Please finish the entire course of the antibiotic even if you are feeling better before it is completed unless you develop an allergic reaction or are told by a medical provider to stop taking it. We have sent a sample of your urine off for a urine culture.  This will help us  determine what bacteria is causing your symptoms as well as the most appropriate antibiotic to treat it.  If we need to make any adjustments to your medication regimen and new medication will be sent to the pharmacy on file and you will be updated via phone call in MyChart. Stay well hydrated (at least 75 oz of water per day) and avoid holding your urine for prolonged periods of time. If you have any of the following please return to urgent care or go to the emergency room: Persistent symptoms, fever, trouble urinating or inability to urinate, confusion, flank pain.   Please speak to your Primary Care Provider regarding your concerns about increased stress and diabetes as this provider would be the one to manage these conditions long term.   Segn sus sntomas y los resultados del anlisis de orina, creo que tiene una infeccin urinaria.  Le recomiendo lo siguiente: He enviado una receta para Macrobid 100 mg, que debe tomar por va oral dos veces al da durante 5 Catano.  Por favor, complete el tratamiento antibitico, incluso si se siente mejor antes de terminarlo, a menos que presente una reaccin alrgica o un mdico le indique que lo suspenda.  Hemos enviado una muestra de su orina para un urocultivo. Esto nos ayudar a determinar qu bacteria est causando sus sntomas y cul es el antibitico ms adecuado para tratarla. Si es necesario ajustar su medicacin, enviaremos un nuevo medicamento a la farmacia registrada y le  informaremos por telfono a travs de Clinical Cytogeneticist.  Mantngase bien hidratado (beba al menos 2,2 litros de agua al da) y evite retener la orina durante periodos prolongados.  Si presenta alguno de los siguientes sntomas, regrese a heritage manager de atencin urgente o acuda a urgencias: sntomas persistentes, fiebre, dificultad o incapacidad para orinar, confusin o dolor en el costado.  Por favor, hable con su mdico de cabecera sobre sus inquietudes relacionadas con el aumento del estrs y la diabetes, ya que l o ella ser quien gestione estas afecciones a largo plazo.

## 2024-06-18 NOTE — ED Provider Notes (Signed)
 [facility]    CSN: [id number] Arrival date & time: 06/18/24  0820      History   Chief Complaint Chief Complaint  Patient presents with   Urinary Frequency    Entered by patient   Blood Sugar Problem    HPI Jennifer Noble is a 48 y.o. female.  has a past medical history of Anxiety, Depression, Dysuria, Hemorrhoid, and High cholesterol.   HPI  Spanish translation services utilized for apt: Eric # [phone]  Pt is here today with concerns for pain with urination and she has concerns that she has diabetes. She states that her PCP sent her here for her concerns She reports she has had pain with urination for about 4 days along with suprapubic pain. She denies flank pain She reports having chills but denies fever, hematuria. She denies vaginal pain, bleeding or discharge.    Patient also reports concerns for increased stress lately.  She states that she is mad frequently.  Past Medical History:  Diagnosis Date   Anxiety    Depression    Dysuria    Hemorrhoid    High cholesterol     Patient Active Problem List   Diagnosis Date Noted   Tear of left gluteus minimus tendon 01/05/2024   Vasomotor symptoms due to menopause 12/03/2022   Headache 05/07/2016   Depression 08/17/2015   Insomnia 08/17/2015   Elevated lipids 04/08/2013   Family history of early CAD 04/08/2013   Hemorrhoid    Dysuria     Past Surgical History:  Procedure Laterality Date   GLUTEUS MINIMUS REPAIR Left 01/05/2024   Procedure: REPAIR, TENDON, GLUTEUS MINIMUS;  Surgeon: Genelle Standing, MD;  Location: [facility];  Service: Orthopedics;  Laterality: Left;  LEFT GLUTEUS MEDIUS REPAIR AND POSSIBLE COLLAGEN PATCH AUGMENTATION   HEMORRHOID SURGERY      OB History     Gravida  3   Para  3   Term  3   Preterm  0   AB  0   Living  3      SAB  0   IAB  0   Ectopic  0   Multiple  0   Live Births  3            Home Medications     Prior to Admission medications   Medication Sig Start Date End Date Taking? Authorizing Provider  nitrofurantoin, macrocrystal-monohydrate, (MACROBID) 100 MG capsule Take 1 capsule (100 mg total) by mouth 2 (two) times daily. 06/18/24  Yes Alexis Reber E, PA-C  aspirin  EC 325 MG tablet Take 1 tablet (325 mg total) by mouth daily. 11/21/23   Genelle Standing, MD  citalopram  (CELEXA ) 20 MG tablet Take 1 tablet (20 mg total) by mouth daily. 12/12/23   Fleming, Zelda W, NP  gabapentin  (NEURONTIN ) 300 MG capsule Take 1 capsule (300 mg total) by mouth 2 (two) times daily. Patient not taking: Reported on 01/09/2024 09/05/23   Brooks, Dana, DO  nitroGLYCERIN  (NITRODUR - DOSED IN MG/24 HR) 0.2 mg/hr patch Cut patch into fourths. Place 1/4 patch over affected area on hip. Change every 24 hours. Patient not taking: Reported on 12/29/2023 09/05/23   Burnetta Brunet, DO  oxyCODONE  (ROXICODONE ) 5 MG immediate release tablet Take 1 tablet (5 mg total) by mouth every 4 (four) hours as needed for severe pain (pain score 7-10) or breakthrough pain. 11/21/23   Genelle Standing, MD    Family History Family History  Problem Relation Age  of Onset   Diabetes Mother    Diabetes Sister    Diabetes Brother    Heart disease Other    Breast cancer Neg Hx     Social History Social History   Tobacco Use   Smoking status: Never   Smokeless tobacco: Never  Vaping Use   Vaping status: Never Used  Substance Use Topics   Alcohol use: No   Drug use: No     Allergies   Naproxen    Review of Systems Review of Systems  Constitutional:  Negative for chills and fever.  Gastrointestinal:  Positive for abdominal pain (suprapubic pain).  Genitourinary:  Positive for dysuria and frequency. Negative for difficulty urinating, flank pain, hematuria, vaginal bleeding, vaginal discharge and vaginal pain.     Physical Exam Triage Vital Signs ED Triage Vitals  Encounter Vitals Group     BP 06/18/24 0846 134/83     Girls  Systolic BP Percentile --      Girls Diastolic BP Percentile --      Boys Systolic BP Percentile --      Boys Diastolic BP Percentile --      Pulse Rate 06/18/24 0846 82     Resp 06/18/24 0846 18     Temp 06/18/24 0846 98 F (36.7 C)     Temp Source 06/18/24 0846 Oral     SpO2 06/18/24 0846 96 %     Weight 06/18/24 0907 177 lb (80.3 kg)     Height 06/18/24 0846 5' 2 (1.575 m)     Head Circumference --      Peak Flow --      Pain Score 06/18/24 0846 0     Pain Loc --      Pain Education --      Exclude from Growth Chart --    No data found.  Updated Vital Signs BP 134/83 (BP Location: Right Arm)   Pulse 82   Temp 98 F (36.7 C) (Oral)   Resp 18   Ht 5' 2 (1.575 m)   Wt 177 lb (80.3 kg)   LMP 02/27/2021 (Approximate)   SpO2 96%   BMI 32.37 kg/m   Visual Acuity Right Eye Distance:   Left Eye Distance:   Bilateral Distance:    Right Eye Near:   Left Eye Near:    Bilateral Near:     Physical Exam Vitals reviewed.  Constitutional:      General: She is awake.     Appearance: Normal appearance. She is well-developed and well-groomed.  HENT:     Head: Normocephalic and atraumatic.  Eyes:     General: Lids are normal. Gaze aligned appropriately.     Extraocular Movements: Extraocular movements intact.     Conjunctiva/sclera: Conjunctivae normal.  Pulmonary:     Effort: Pulmonary effort is normal.  Neurological:     Mental Status: She is alert and oriented to person, place, and time.  Psychiatric:        Attention and Perception: Attention and perception normal.        Mood and Affect: Mood and affect normal.        Speech: Speech normal.        Behavior: Behavior normal. Behavior is cooperative.        Thought Content: Thought content normal.        Judgment: Judgment normal.      UC Treatments / Results  Labs (all labs ordered are listed, but only abnormal results are displayed)  Labs Reviewed  POCT URINE DIPSTICK - Abnormal; Notable for the following  components:      Result Value   Clarity, UA cloudy (*)    Blood, UA trace-intact (*)    Leukocytes, UA Small (1+) (*)    All other components within normal limits  GLUCOSE, POCT (MANUAL RESULT ENTRY) - Abnormal; Notable for the following components:   POC Glucose 129 (*)    All other components within normal limits  URINE CULTURE    EKG   Radiology No results found.  Procedures Procedures (including critical care time)  Medications Ordered in UC Medications - No data to display  Initial Impression / Assessment and Plan / UC Course  I have reviewed the triage vital signs and the nursing notes.  Pertinent labs & imaging results that were available during my care of the patient were reviewed by me and considered in my medical decision making (see chart for details).      Final Clinical Impressions(s) / UC Diagnoses   Final diagnoses:  Hyperglycemia  Dysuria    Patient presents today with concerns for pain with urination as well as concerns for diabetes.  She states that she has checked her blood sugar at home yesterday and it was elevated.  She reports that she has had pain with urination for about 4 days along with suprapubic pain but denies flank pain, fever, hematuria, vaginal symptoms.  Urine dip is suspicious for UTI so we will start Macrobid p.o. twice daily x 5 days.  Will send urine culture all for definitive identification and susceptibility testing.  Reviewed with patient that she has an upcoming appointment with her PCP and they can do blood work to assess for diabetes as well as concerns for increased stress.  Reviewed with patient that she is already on a medication, Celexa  for stress and anxiety.  Recommend further follow-up with PCP at upcoming appointment to discuss concerns regarding chronic conditions.  Patient voices agreement understanding with this.    Discharge Instructions      Based on your symptoms and results of the urinalysis I believe you have a  UTI I recommend the following:  I have sent in a script for Macrobid 100 mg to be taken by mouth twice per day for 5 days.  Please finish the entire course of the antibiotic even if you are feeling better before it is completed unless you develop an allergic reaction or are told by a medical provider to stop taking it. We have sent a sample of your urine off for a urine culture.  This will help us  determine what bacteria is causing your symptoms as well as the most appropriate antibiotic to treat it.  If we need to make any adjustments to your medication regimen and new medication will be sent to the pharmacy on file and you will be updated via phone call in MyChart. Stay well hydrated (at least 75 oz of water per day) and avoid holding your urine for prolonged periods of time. If you have any of the following please return to urgent care or go to the emergency room: Persistent symptoms, fever, trouble urinating or inability to urinate, confusion, flank pain.   Please speak to your Primary Care Provider regarding your concerns about increased stress and diabetes as this provider would be the one to manage these conditions long term.   Segn sus sntomas y los resultados del anlisis de orina, creo que tiene una infeccin urinaria.  Le recomiendo lo siguiente:  He enviado una receta para Macrobid 100 mg, que debe tomar por va oral dos veces al da durante 5 [place].  Por favor, complete el tratamiento antibitico, incluso si se siente mejor antes de terminarlo, a menos que presente una reaccin alrgica o un mdico le indique que lo suspenda.  Hemos enviado una muestra de su orina para un urocultivo. Esto nos ayudar a determinar qu bacteria est causando sus sntomas y cul es el antibitico ms adecuado para tratarla. Si es necesario ajustar su medicacin, enviaremos un nuevo medicamento a la farmacia registrada y le informaremos por telfono a travs de Clinical Cytogeneticist.  Mantngase bien hidratado (beba al  menos 2,2 litros de agua al da) y evite retener la orina durante periodos prolongados.  Si presenta alguno de los siguientes sntomas, regrese a heritage manager de atencin urgente o acuda a urgencias: sntomas persistentes, fiebre, dificultad o incapacidad para orinar, confusin o dolor en el costado.  Por favor, hable con su mdico de cabecera sobre sus inquietudes relacionadas con el aumento del estrs y la diabetes, ya que l o ella ser quien gestione estas afecciones a largo plazo.      ED Prescriptions     Medication Sig Dispense Auth. Provider   nitrofurantoin, macrocrystal-monohydrate, (MACROBID) 100 MG capsule Take 1 capsule (100 mg total) by mouth 2 (two) times daily. 10 capsule Nilza Eaker E, PA-C      PDMP not reviewed this encounter.   Anaeli Cornwall E, PA-C 06/18/24 8095

## 2024-06-19 LAB — URINE CULTURE

## 2024-06-20 ENCOUNTER — Emergency Department (HOSPITAL_COMMUNITY): Payer: Self-pay

## 2024-06-20 ENCOUNTER — Encounter (HOSPITAL_COMMUNITY): Payer: Self-pay

## 2024-06-20 ENCOUNTER — Other Ambulatory Visit: Payer: Self-pay

## 2024-06-20 ENCOUNTER — Emergency Department (HOSPITAL_COMMUNITY)
Admission: EM | Admit: 2024-06-20 | Discharge: 2024-06-20 | Disposition: A | Discharge status: Home / Self Care | Payer: Self-pay | Attending: Emergency Medicine | Admitting: Emergency Medicine

## 2024-06-20 DIAGNOSIS — R519 Headache, unspecified: Secondary | ICD-10-CM | POA: Insufficient documentation

## 2024-06-20 DIAGNOSIS — Z7982 Long term (current) use of aspirin: Secondary | ICD-10-CM | POA: Insufficient documentation

## 2024-06-20 DIAGNOSIS — F419 Anxiety disorder, unspecified: Secondary | ICD-10-CM | POA: Insufficient documentation

## 2024-06-20 LAB — COMPREHENSIVE METABOLIC PANEL WITH GFR
ALT: 45 U/L — ABNORMAL HIGH (ref 0–44)
AST: 33 U/L (ref 15–41)
Albumin: 4.1 g/dL (ref 3.5–5.0)
Alkaline Phosphatase: 69 U/L (ref 38–126)
Anion gap: 12 (ref 5–15)
BUN: 9 mg/dL (ref 6–20)
CO2: 22 mmol/L (ref 22–32)
Calcium: 9 mg/dL (ref 8.9–10.3)
Chloride: 104 mmol/L (ref 98–111)
Creatinine, Ser: 0.47 mg/dL (ref 0.44–1.00)
GFR, Estimated: >60 mL/min (ref >60)
Glucose, Bld: 218 mg/dL — ABNORMAL HIGH (ref 70–99)
Potassium: 3.8 mmol/L (ref 3.5–5.1)
Sodium: 138 mmol/L (ref 135–145)
Total Bilirubin: 0.7 mg/dL (ref 0.0–1.2)
Total Protein: 7.7 g/dL (ref 6.5–8.1)

## 2024-06-20 LAB — URINALYSIS, ROUTINE W REFLEX MICROSCOPIC
Bilirubin Urine: NEGATIVE
Glucose, UA: NEGATIVE mg/dL
Hgb urine dipstick: NEGATIVE
Ketones, ur: 20 mg/dL — AB
Nitrite: NEGATIVE
Protein, ur: NEGATIVE mg/dL
Specific Gravity, Urine: 1.014 (ref 1.005–1.030)
pH: 5 (ref 5.0–8.0)

## 2024-06-20 LAB — CBC WITH DIFFERENTIAL/PLATELET
Abs Immature Granulocytes: 0.04 K/uL (ref 0.00–0.07)
Basophils Absolute: 0 K/uL (ref 0.0–0.1)
Basophils Relative: 0 %
Eosinophils Absolute: 0 K/uL (ref 0.0–0.5)
Eosinophils Relative: 0 %
HCT: 41.2 % (ref 36.0–46.0)
Hemoglobin: 13.5 g/dL (ref 12.0–15.0)
Immature Granulocytes: 0 %
Lymphocytes Relative: 31 %
Lymphs Abs: 3.1 K/uL (ref 0.7–4.0)
MCH: 30.1 pg (ref 26.0–34.0)
MCHC: 32.8 g/dL (ref 30.0–36.0)
MCV: 91.8 fL (ref 80.0–100.0)
Monocytes Absolute: 0.5 K/uL (ref 0.1–1.0)
Monocytes Relative: 5 %
Neutro Abs: 6.1 K/uL (ref 1.7–7.7)
Neutrophils Relative %: 64 %
Platelets: 325 K/uL (ref 150–400)
RBC: 4.49 MIL/uL (ref 3.87–5.11)
RDW: 13.2 % (ref 11.5–15.5)
WBC: 9.8 K/uL (ref 4.0–10.5)
nRBC: 0 % (ref 0.0–0.2)

## 2024-06-20 LAB — CBG MONITORING, ED: Glucose-Capillary: 210 mg/dL — ABNORMAL HIGH (ref 70–99)

## 2024-06-20 MED ORDER — ACETAMINOPHEN 500 MG PO TABS
1000.0000 mg | ORAL_TABLET | Freq: Once | ORAL | Status: AC
  Administered 2024-06-20: 1000 mg via ORAL
  Filled 2024-06-20: qty 2

## 2024-06-20 MED ORDER — LORAZEPAM 1 MG PO TABS
0.5000 mg | ORAL_TABLET | Freq: Once | ORAL | Status: AC
  Administered 2024-06-20: 0.5 mg via ORAL
  Filled 2024-06-20: qty 1

## 2024-06-20 MED ORDER — LORAZEPAM 0.5 MG PO TABS: 0.5000 mg | ORAL_TABLET | Freq: Two times a day (BID) | ORAL | 0 refills | Status: DC | PRN

## 2024-06-20 NOTE — Discharge Instructions (Signed)
 Como comentamos, consulte con su mdico para el manejo de los sntomas de Jennifer Noble. Contine tomando Tylenol  y/o Motrin  para cualquier dolor de cabeza recurrente. Su nivel de azcar en sangre hoy fue de 218. Esto est un poco por encima de lo normal y su mdico debe revisarlo para determinar si necesita medicacin para technical brewer.  As we discussed, follow up with your doctor for further management of anxiety symptoms. Continue Tylenol  and/or Motrin  for any recurrent headache. Your blood sugar today was 218. This is a little above normal and needs to be reviewed by your doctor to determine whether medication for control of blood sugar is needed.

## 2024-06-20 NOTE — ED Provider Triage Note (Signed)
 Emergency Medicine Provider Triage Evaluation Note  Sulema Bellami Farrelly , a 48 y.o. female  was evaluated in triage.  Pt complains of 5 days of urinary frequency and dysuria as well as significant anxiety, lightheadedness and mild headache.  Denies injury trauma or fall.  Denies fever.  Review of Systems  Positive: Ha Negative: Fever  Physical Exam  BP 136/72 (BP Location: Left Arm)   Pulse (!) 108   Temp 98.3 F (36.8 C) (Oral)   Resp 20   LMP 02/27/2021 (Approximate)   SpO2 99%  Gen:   Awake, no distress   Resp:  Normal effort  MSK:   Moves extremities without difficulty  Other:  No focal neurological deficits  Medical Decision Making  Medically screening exam initiated at 2:17 PM.  Appropriate orders placed.  Margaretmary Aburto Blas was informed that the remainder of the evaluation will be completed by another provider, this initial triage assessment does not replace that evaluation, and the importance of remaining in the ED until their evaluation is complete.     Shermon Warren SAILOR, PA-C 06/20/24 1418
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
## 2024-06-20 NOTE — ED Triage Notes (Signed)
 Patient in the ED with complaints of anxiety, dizziness and diaphoretic. She also reports a headache.  She reports she has had these symptoms for the past week.  Patient also reports that she has frequent urination. Reports burning when using the restroom. Denies odor.

## 2024-06-21 ENCOUNTER — Ambulatory Visit: Payer: Self-pay

## 2024-06-21 ENCOUNTER — Other Ambulatory Visit: Payer: Self-pay | Admitting: Nurse Practitioner
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
## 2024-06-21 NOTE — Telephone Encounter (Signed)
   Copied from CRM [id number]. Topic: Clinical - Red Word Triage >> Jun 21, 2024  9:49 AM Victoria B wrote: [facility] that prompted transfer to Nurse Triage: Patient still has anxiety even after visit to the ER and med has run out

## 2024-06-22 ENCOUNTER — Telehealth: Payer: Self-pay | Admitting: Nurse Practitioner

## 2024-06-22 ENCOUNTER — Telehealth: Payer: Self-pay

## 2024-06-22 ENCOUNTER — Ambulatory Visit (HOSPITAL_COMMUNITY): Payer: Self-pay

## 2024-06-22 NOTE — Telephone Encounter (Signed)
 I do not prescribe ativan or valium. If she would like a medication for anxiety I can send hydroxyzine .

## 2024-06-22 NOTE — Telephone Encounter (Signed)
 Requested medications are due for refill today.  unsure  Requested medications are on the active medications list.  yes  Last refill. 06/20/2024 #5 rf  Future visit scheduled.   yes  Notes to clinic.  Refill not delegated.  Rx signed by another provider.    Requested Prescriptions  Pending Prescriptions Disp Refills   LORazepam (ATIVAN) 0.5 MG tablet 5 tablet 0    Sig: Take 1 tablet (0.5 mg total) by mouth 2 (two) times daily as needed for anxiety.     Not Delegated - Psychiatry: Anxiolytics/Hypnotics 2 Failed - 06/22/2024  3:59 PM      Failed - This refill cannot be delegated      Failed - Urine Drug Screen completed in last 360 days      Passed - Patient is not pregnant      Passed - Valid encounter within last 6 months    Recent Outpatient Visits           5 months ago Anxiety and depression   [facility] [place]. [facility] Theotis Haze ORN, NP   1 year ago Bacterial sinusitis   [facility] [place]. [facility] Theotis Haze ORN, NP   1 year ago Trochanteric bursitis of left hip   [facility] Of [place]. [facility], Jon HERO, [place]   1 year ago Encounter for annual physical exam   [facility] Of [place]. [facility] Theotis Haze ORN, NP   3 years ago Physical exam   [facility] Dept Of [place]. [facility] Theotis Haze ORN, [place]

## 2024-06-22 NOTE — Telephone Encounter (Signed)
 Copied from CRM [id number]. Topic: Clinical - Medication Question >> Jun 22, 2024  9:35 AM Victoria B wrote: Reason for CRM: Patient called, wants to know if its ok to take the lorazapam just whenever she feels the need , instead of twice a day

## 2024-06-22 NOTE — Telephone Encounter (Signed)
 Copied from CRM #[id number]. Topic: General - Other >> Jun 22, 2024  9:32 AM Victoria B wrote:  Reason for CRM: Patient called wants to know will she get more than a 3 day supply of lorazapam that was sent to the pharmacy yesterday

## 2024-06-22 NOTE — Telephone Encounter (Signed)
 Duplicate

## 2024-06-23 ENCOUNTER — Emergency Department (HOSPITAL_COMMUNITY)
Admission: EM | Admit: 2024-06-23 | Discharge: 2024-06-23 | Disposition: A | Discharge status: Home / Self Care | Payer: Self-pay

## 2024-06-23 ENCOUNTER — Emergency Department (HOSPITAL_COMMUNITY): Payer: Self-pay

## 2024-06-23 ENCOUNTER — Other Ambulatory Visit: Payer: Self-pay

## 2024-06-23 DIAGNOSIS — W01198A Fall on same level from slipping, tripping and stumbling with subsequent striking against other object, initial encounter: Secondary | ICD-10-CM | POA: Insufficient documentation

## 2024-06-23 DIAGNOSIS — Z7982 Long term (current) use of aspirin: Secondary | ICD-10-CM | POA: Insufficient documentation

## 2024-06-23 DIAGNOSIS — M549 Dorsalgia, unspecified: Secondary | ICD-10-CM | POA: Insufficient documentation

## 2024-06-23 DIAGNOSIS — M25511 Pain in right shoulder: Secondary | ICD-10-CM | POA: Insufficient documentation

## 2024-06-23 DIAGNOSIS — W19XXXA Unspecified fall, initial encounter: Secondary | ICD-10-CM

## 2024-06-23 MED ORDER — ACETAMINOPHEN 500 MG PO TABS
1000.0000 mg | ORAL_TABLET | Freq: Once | ORAL | Status: AC
  Administered 2024-06-23: 1000 mg via ORAL
  Filled 2024-06-23: qty 2

## 2024-06-23 NOTE — Telephone Encounter (Signed)
 Copied from CRM [id number]. Topic: Clinical - Medication Question >> Jun 23, 2024 12:59 PM Winona R wrote:  Pt states she is expecting a call back in regards to the med request for medication similar to lorazepam  >> Jun 23, 2024  3:23 PM Amy B wrote: Patient's 3rd attempt to reach someone regarding a prescription for anxiety medication.  She requests a call back to discuss.  [phone]

## 2024-06-23 NOTE — Telephone Encounter (Signed)
 She should continue citalopram .

## 2024-06-23 NOTE — Telephone Encounter (Signed)
 Awaiting providers response

## 2024-06-23 NOTE — Telephone Encounter (Signed)
 Patient identified by name and date of birth.  Patient aware of results and voiced understanding.  Interpreter used to relay information to patient.  ID # is Clarisa.

## 2024-06-23 NOTE — Telephone Encounter (Signed)
 Copied from CRM [id number]. Topic: Clinical - Medication Question >> Jun 23, 2024 12:59 PM Winona R wrote:  Pt states she is expecting a call back in regards to the med request for medication similar to lorazepam

## 2024-06-23 NOTE — Discharge Instructions (Signed)
 You were evaluated in the emergency room following a fall.  Your x-ray did not show any fracture.  You may use Tylenol  and ibuprofen  at home.  Please call the number on the sheet to follow-up with the orthopedic doctor if your symptoms persist.  Tras una cada, fue evaluado en urgencias. La radiografa no mostr ninguna fractura. Puede tomar paracetamol e ibuprofeno en casa. Si los sntomas persisten, llame al nmero que aparece en la hoja para programar una cita con scientist, product/process development.

## 2024-06-23 NOTE — Telephone Encounter (Signed)
 Patient identified by name and date of birth.  Patient aware of providers response and voiced understanding.  Interpreter used to relay information to patient.  ID # is [id number].

## 2024-06-23 NOTE — ED Notes (Signed)
 Patient transported to X-ray

## 2024-06-23 NOTE — ED Provider Notes (Addendum)
 [place] EMERGENCY DEPARTMENT AT [facility] Provider Note   CSN: [id number] Arrival date & time: 06/23/24  9077     Patient presents with: Back Pain and Fall   Jennifer Noble is a 48 y.o. female with noncontributory past medical history presents following mechanical ground-level fall that occurred yesterday.  States that she tripped and hit her head and right side on a wall.  No LOC.  No blood thinners.  Has had no vomiting, headache, vision changes, extremity weakness or numbness.  Primarily complains of pain into her right shoulder and scapular region.  Pain is worse with movement.  No difficulty ambulating or speaking.  Patient is Spanish-speaking.  Exam mediated by telemetry interpreter    Back Pain Fall      Past Medical History:  Diagnosis Date  . Anxiety   . Depression   . Dysuria   . Hemorrhoid   . High cholesterol      Prior to Admission medications   Medication Sig Start Date End Date Taking? Authorizing Provider  aspirin  EC 325 MG tablet Take 1 tablet (325 mg total) by mouth daily. 11/21/23   Genelle Standing, MD  citalopram  (CELEXA ) 20 MG tablet Take 1 tablet (20 mg total) by mouth daily. 12/12/23   Fleming, Zelda W, NP  gabapentin  (NEURONTIN ) 300 MG capsule Take 1 capsule (300 mg total) by mouth 2 (two) times daily. Patient not taking: Reported on 01/09/2024 09/05/23   Brooks, Dana, DO  LORazepam (ATIVAN) 0.5 MG tablet Take 1 tablet (0.5 mg total) by mouth 2 (two) times daily as needed for anxiety. 06/20/24   Odell Balls, PA-C  nitrofurantoin, macrocrystal-monohydrate, (MACROBID) 100 MG capsule Take 1 capsule (100 mg total) by mouth 2 (two) times daily. 06/18/24   Mecum, Erin E, PA-C  nitroGLYCERIN  (NITRODUR - DOSED IN MG/24 HR) 0.2 mg/hr patch Cut patch into fourths. Place 1/4 patch over affected area on hip. Change every 24 hours. Patient not taking: Reported on 12/29/2023 09/05/23   Burnetta Brunet, DO  oxyCODONE  (ROXICODONE ) 5 MG immediate  release tablet Take 1 tablet (5 mg total) by mouth every 4 (four) hours as needed for severe pain (pain score 7-10) or breakthrough pain. 11/21/23   Genelle Standing, MD    Allergies: Naproxen     Review of Systems  Musculoskeletal:  Positive for back pain.    Updated Vital Signs BP 123/79 (BP Location: Right Arm)   Pulse 86   Temp 98 F (36.7 C) (Oral)   Resp 16   LMP 02/27/2021 (Approximate)   SpO2 97%   Physical Exam Vitals and nursing note reviewed.  Constitutional:      General: She is not in acute distress.    Appearance: She is well-developed.  HENT:     Head: Normocephalic and atraumatic.  Eyes:     Conjunctiva/sclera: Conjunctivae normal.  Cardiovascular:     Rate and Rhythm: Normal rate and regular rhythm.     Heart sounds: No murmur heard. Pulmonary:     Effort: Pulmonary effort is normal. No respiratory distress.     Breath sounds: Normal breath sounds.  Abdominal:     Palpations: Abdomen is soft.     Tenderness: There is no abdominal tenderness.  Musculoskeletal:        General: No swelling.     Cervical back: Neck supple.     Comments: Mild tenderness to right scapula and upper trap region.  Tolerates full right shoulder range of motion, 5 out of 5 strength  Skin:    General: Skin is warm and dry.     Capillary Refill: Capillary refill takes less than 2 seconds.  Neurological:     Mental Status: She is alert.     Comments: Patient is alert and oriented. There is no abnormal phonation. Symmetric smile without facial droop. Moves all extremities spontaneously. 5/5 strength in upper and lower extremities. . No sensation deficit. There is no nystagmus. EOMI, PERRL. Coordination intact with finger to nose and normal ambulation.    Psychiatric:        Mood and Affect: Mood normal.     (all labs ordered are listed, but only abnormal results are displayed) Labs Reviewed - No data to display  EKG: None  Radiology: DG Shoulder Right Result Date:  06/23/2024 CLINICAL DATA:  Fall with right shoulder pain. EXAM: RIGHT SHOULDER - 2+ VIEW COMPARISON:  None Available. FINDINGS: No fracture or dislocation. Mild degenerative changes in the right acromioclavicular joint. Visualized right chest is unremarkable. IMPRESSION: 1. No acute findings. 2. Mild right acromioclavicular joint osteoarthritis. Electronically Signed   By: Newell Eke M.D.   On: 06/23/2024 11:21     Procedures   Medications Ordered in the ED  acetaminophen  (TYLENOL ) tablet 1,000 mg (1,000 mg Oral Given 06/23/24 1056)    Clinical Course as of 06/23/24 1137  Wed Jun 23, 2024  1033 Patient evaluated following mechanical ground-level fall that occurred yesterday, primarily complaining of right shoulder pain.  She is hemodynamically stable.  On exam she has mild tenderness to the right scapula and upper trap region.  She has no midline spinal tenderness.  No neurodeficits on exam.  Is normocephalic atraumatic.  She tolerates full right shoulder range of motion.  She states that she is here for x-rays.  Will obtain right shoulder films and provide dose of Tylenol  while here in the ED. [JT]  1136 DG Shoulder Right No acute abnormality.  Patient will be discharged home.  Recommended supportive care.  Will provide Ortho follow-up. [JT]    Clinical Course User Index [JT] Donnajean Lynwood DEL, PA-C                                 Medical Decision Making Amount and/or Complexity of Data Reviewed Radiology: ordered.  Risk OTC drugs.   This patient presents to the ED with chief complaint(s) of fall .  The complaint involves an extensive differential diagnosis and also carries with it a high risk of complications and morbidity.   Pertinent past medical history as listed in HPI  The differential diagnosis includes  Do not suspect intracranial head or spinal fracture.  No CT imaging indicated per [place]/[employer] criteria. No evidence of fracture on xray. Additional history  obtained: Records reviewed Care Everywhere/External Records  Disposition:   Patient will be discharged home. The patient has been appropriately medically screened and/or stabilized in the ED. I have low suspicion for any other emergent medical condition which would require further screening, evaluation or treatment in the ED or require inpatient management. At time of discharge the patient is hemodynamically stable and in no acute distress. I have discussed work-up results and diagnosis with patient and answered all questions. Patient is agreeable with discharge plan. We discussed strict return precautions for returning to the emergency department and they verbalized understanding.     Social Determinants of Health:   none  This note was dictated with voice recognition software.  Despite best efforts at proofreading, errors may have occurred which can change the documentation meaning.       Final diagnoses:  Fall, initial encounter    ED Discharge Orders     None          Donnajean Lynwood DEL, PA-C 06/23/24 1126    Donnajean Lynwood DEL, PA-C 06/23/24 1137    Neysa Caron PARAS, DO 06/23/24 1608

## 2024-06-24 ENCOUNTER — Telehealth: Payer: Self-pay

## 2024-06-24 NOTE — Telephone Encounter (Signed)
 Copied from CRM #[id number]. Topic: Clinical - Prescription Issue >> Jun 24, 2024  2:55 PM Hadassah PARAS wrote: Reason for CRM: Pt is following up on med LORazepam (ATIVAN) 0.5 MG tablet. It is showing refused. Please advise [phone]

## 2024-06-24 NOTE — Telephone Encounter (Signed)
 Thank you :)

## 2024-06-24 NOTE — Telephone Encounter (Signed)
(  Interpreter [phone]): Spoke with the patient regarding her recent visit to the Emergency Department on 06/20/2024 for anxiety. The patient is requesting medication for anxiety management. I informed her that she must be evaluated by one of our providers before any medication can be prescribed and sent to her pharmacy. An appointment was offered, but the patient declined, stating she needed to be seen sooner than the available slot. I advised her that she could be seen at our Mobile Unit (MU) on Monday, 06/28/2024. The patient agreed to this option. The location address was provided, and she was instructed to arrive by 8:00 AM, as the provider and staff take lunch from 12:00 PM to 1:00 PM and the clinic closes at 3:30 PM. The patient voiced understanding.

## 2024-06-24 NOTE — Telephone Encounter (Signed)
 Noted

## 2024-06-28 ENCOUNTER — Encounter: Payer: Self-pay | Admitting: Physician Assistant

## 2024-06-28 ENCOUNTER — Telehealth: Payer: Self-pay | Admitting: Nurse Practitioner

## 2024-06-28 ENCOUNTER — Ambulatory Visit: Payer: Self-pay | Admitting: Physician Assistant

## 2024-06-28 ENCOUNTER — Other Ambulatory Visit: Payer: Self-pay | Admitting: Nurse Practitioner

## 2024-06-28 VITALS — BP 140/76 | HR 86 | Ht 62.0 in | Wt 171.0 lb

## 2024-06-28 DIAGNOSIS — Z7984 Long term (current) use of oral hypoglycemic drugs: Secondary | ICD-10-CM

## 2024-06-28 DIAGNOSIS — F411 Generalized anxiety disorder: Secondary | ICD-10-CM

## 2024-06-28 DIAGNOSIS — R030 Elevated blood-pressure reading, without diagnosis of hypertension: Secondary | ICD-10-CM

## 2024-06-28 DIAGNOSIS — E1165 Type 2 diabetes mellitus with hyperglycemia: Secondary | ICD-10-CM

## 2024-06-28 LAB — POCT GLYCOSYLATED HEMOGLOBIN (HGB A1C)
HbA1c POC (<> result, manual entry): 6 % (ref 4.0–5.6)
HbA1c, POC (controlled diabetic range): 6 % (ref 0.0–7.0)
HbA1c, POC (prediabetic range): 6 % (ref 5.7–6.4)
Hemoglobin A1C: 6 % — AB (ref 4.0–5.6)

## 2024-06-28 MED ORDER — METFORMIN HCL ER 500 MG PO TB24: 500.0000 mg | ORAL_TABLET | Freq: Every day | ORAL | 1 refills | Status: DC

## 2024-06-28 MED ORDER — HYDROXYZINE HCL 10 MG PO TABS: 10.0000 mg | ORAL_TABLET | Freq: Three times a day (TID) | ORAL | 0 refills | Status: DC | PRN

## 2024-06-28 NOTE — Patient Instructions (Addendum)
 VISIT SUMMARY:  Today, we discussed your increased anxiety and recent panic attack, as well as your elevated blood sugar levels. We have made some adjustments to your medications and ordered additional tests to better understand and manage your conditions.  YOUR PLAN:  -TYPE 2 DIABETES MELLITUS WITH HYPERGLYCEMIA: Type 2 diabetes is a condition where your body does not use insulin properly, leading to high blood sugar levels. We have started you on metformin, which you should take once daily in the morning. We also ordered an A1c test to check your current blood sugar control and additional lab work to assess your thyroid function, vitamin D  levels, and kidney function.  -GENERALIZED ANXIETY DISORDER: Generalized anxiety disorder is a condition characterized by excessive worry and anxiety. Your anxiety may be worsened by high blood sugar levels. Continue taking Celexa  daily as prescribed. We have also prescribed hydroxyzine  to take as needed for anxiety. Additional lab work has been ordered to rule out other causes of anxiety.   - Elevated Blood Pressure - please check your BP at home, keep a written log and take with you to your office visits.   Cmo tomarse la presin arterial How to Take Your Blood Pressure La presin arterial es la medida de la fuerza de la sangre al presionar contra las paredes de las arterias. Las arterias son los vasos sanguneos que transportan la sangre desde el corazn hacia todas las partes del cuerpo. Su mdico toma su presin arterial en cada visita al consultorio. Usted tambin puede tomarse la presin arterial en casa con un tensimetro. Es posible que deba tomarse la presin arterial: Para confirmar un diagnstico de presin arterial elevada (hipertensin). Para vigilar su presin arterial a lo largo del tiempo. Para asegurarse de que el medicamento que toma para la presin arterial est surtiendo engineer, mining. Materiales necesarios: Monitor para medir la presin  arterial. Una silla para sentarse. Debe ser ignacia silla en la que pueda sentarse erguido con la espalda apoyada. No se siente en un silln blando o sof. Mesa o escritorio. Cuaderno pequeo y lpiz o bolgrafo. Cmo prepararse Para obtener la lectura ms precisa, evite realizar lo siguiente durante los 30 minutos previos a chief operating officer su presin arterial: Beber cafena. Consumir alcohol. [place]er. Fumar. Realizar actividad fsica. Cinco minutos antes de controlar su presin arterial: Vaya al bao y orine para tener la vejiga vaca. Sintese tranquilamente en una silla. No hable. Cmo tomarse la presin arterial Para controlar su presin arterial, siga las instrucciones presentes en el manual que se incluye con el tensimetro. Si tiene ambulance person, las instrucciones podran ser las siguientes: Sintese derecho en una silla. Coloque los pies en el piso. No cruce los tobillos ni las piernas. Apoye su brazo izquierdo al nivel de su corazn en una mesa o escritorio, o en el brazo de la silla. Arremnguese. Envuelva la parte superior de su brazo izquierdo, 1 pulgada (2.5 cm) sobre su codo, con el brazalete. Es mejor optometrist brazalete alrededor de la piel [place]. Ajuste el brazalete ceidamente, pero no demasiado apretado, alrededor del brazo. Debe poder meter nicamente un dedo entre el brazalete y el brazo. Coloque el cordn de modo que quede apoyado en el pliegue del codo. Presione el botn de encendido. Permanezca sentado tranquilamente mientras el brazalete se infla y se desinfla. Lea la lectura digital que aparece en la pantalla del tensimetro y anote los nmeros (regstrelos) en un cuaderno. Espere de 2 a 3 minutos, y luego repita los pasos desde el paso 1.  Qu significa mi lectura de presin arterial? Una lectura de la presin arterial consta de un nmero ms alto sobre un nmero ms bajo. En condiciones ideales, la presin arterial debe estar por debajo de 120/80. El primer  nmero ("superior") es la presin sistlica. Es la medida de la presin de las arterias cuando el corazn late. El segundo nmero ("inferior") es la presin diastlica. Es la medida de la presin en las arterias cuando el corazn se relaja. La presin arterial se clasifica en cuatro etapas. Las siguientes son las etapas para adultos que no tienen enfermedad grave de corto plazo o una afeccin crnica. La presin sistlica y la presin diastlica se miden en una unidad llamada mm Hg (milmetros de mercurio).  Normal Presin sistlica: por debajo de 120. Presin diastlica: por debajo de 80. Elevada Presin sistlica: 120-129. Presin diastlica: por debajo de 80. Etapa 1 de hipertensin Presin sistlica: 130-139. Presin diastlica: 80-89. Etapa 2 de hipertensin Presin sistlica: 140 o ms. Presin diastlica: 90 o ms. Puede tener presin arterial elevada o hipertensin incluso si nicamente el nmero sistlico o el diastlico de su lectura es ms elevado de lo normal. Siga estas indicaciones en su casa: Medicamentos Use los medicamentos de venta libre y los recetados solamente como se lo haya indicado el mdico. Dgale a su mdico si tiene efectos secundarios causados por los medicamentos para la presin arterial. Indicaciones generales Mida su presin arterial con la frecuencia recomendada por su mdico. Contrlese la presin arterial a la misma hora todos los [place]. Lleve el tensimetro a su prxima cita con el mdico para asegurarse de lo siguiente: Que lo usa  correctamente. Que genera lecturas precisas. Asegrese de entender cules son sus nmeros objetivo para la presin arterial. Concurra a todas las visitas de seguimiento. Esto es importante. Consejos generales Su mdico puede sugerirle un tensimetro confiable que cumpla con sus necesidades. Existen varios tipos de tensimetros para cabin crew. Escoja un tensimetro que tenga un brazalete. No escoja un tensimetro que  mida su presin arterial en la mueca o el dedo. Elija un brazalete que le envuelva ceidamente, pero no ajuste demasiado ni quede muy flojo, la parte superior del brazo. Debe poder meter nicamente un dedo entre el brazalete y el brazo. Puede comprar un tensimetro en la mayora de las farmacias o en lnea. Dnde obtener ms informacin American Heart Association (Asociacin Estadounidense del Corazn): www.heart.org Comunquese con un mdico si: Su presin arterial es sistemticamente alta. Su presin arterial disminuye repentinamente. Solicite ayuda de inmediato si: Su presin arterial sistlica est por encima de 180. Su presin arterial diastlica est por encima de 120. Estos sntomas pueden customer service manager. Solicite ayuda de inmediato. Llame al 911. No espere a ver si los sntomas desaparecen. No conduzca por sus propios medios officemax incorporated. Resumen La presin arterial es la medida de la fuerza de la sangre al presionar contra las paredes de las arterias. Una lectura de la presin arterial consta de un nmero ms alto sobre un nmero ms bajo. En condiciones ideales, la presin arterial debe estar por debajo de 120/80. Contrlese la presin arterial a la [employer]. Evite la cafena, el alcohol, fumar y hacer actividad fsica durante los 30 minutos anteriores a controlarse la presin arterial. Estos factores pueden afectar la precisin de la lectura de la presin arterial. Esta informacin no tiene como fin reemplazar el consejo del mdico. Asegrese de hacerle al mdico cualquier pregunta que tenga. Document Revised: 05/14/2021 Document Reviewed: 05/14/2021  Elsevier Patient Education  2024 [employer].  Control de la ansiedad en los adultos Managing Anxiety, Adult Despus de haber recibido un diagnstico de ansiedad, es posible que se sienta aliviado al saber por qu se haba sentido o haba actuado de cierto modo. Es posible que tambin se sienta  abrumado por el tratamiento que tiene por delante y por lo que este significar para su vida. Con cuidado y apoyo, puede controlar su ansiedad. Cmo manejar los cambios en el estilo de vida Comprender la diferencia entre estrs y ansiedad Aunque el estrs puede desempear un papel en la ansiedad, no es lo mismo que la ansiedad. El estrs es la reaccin del cuerpo ante los cambios y los acontecimientos de la vida, tanto buenos como malos. El estrs a menudo es causado por algo externo, como una fecha lmite, una prueba o una competencia. Normalmente desaparece despus de que el acontecimiento ha finalizado y dura solo unas horas. Pero el estrs puede ser continuo y conducir a ms que solo estrs. La ansiedad es causada por algo interno, como imaginar un resultado terrible o preocuparse porque algo ir mal y lo copy. A menudo, la ansiedad no desaparece incluso despus de que el acontecimiento ha finalizado y puede convertirse en una preocupacin a largo plazo (crnica). Reducir el estrs y la ansiedad Hable con el mdico o un consejero para obtener ms informacin sobre cmo reducir la ansiedad y development worker, community. Es posible que sugiera tcnicas para reducir la tensin, tales como: Msica. Pasar tiempo creando o escuchando msica que disfrute y lo inspire. Meditacin consciente. Practicar el estar consciente de la respiracin normal sin intentar controlarla. Puede realizarse mientras est sentado o camina. Oracin centrante. Centrarse en ppl corporation, frase o imagen sagrada que le signifique algo y le genere [address]. Respiracin profunda. Expanda el estmago e inhale lentamente por la nariz. Mantenga el aire durante unos 3 a 5 segundos. Luego, exhale lentamente mientras deja que los msculos del estmago se relajen. Dilogo interno. Aprenda a observar y public house manager patrones de pensamiento que provocan reacciones de ansiedad. Cambie esos patrones por pensamientos que transmitan tranquilidad. Relajacin  muscular. Dedique tiempo a tensar los msculos y dynegy. Elija una tcnica para reducir la tensin que se adapte a su estilo de vida y su personalidad. Estas tcnicas llevan tiempo y prctica. Resrvese de 5 a 15 minutos por da para associate professor. Los terapeutas especializados pueden ofrecer orientacin y capacitacin en estas tcnicas. Es posible que algunos planes de seguros mdicos cubran la capacitacin. Otras cosas que puede hacer para controlar el estrs y la ansiedad incluyen: Llevar un registro del estrs. Esto puede ayudarlo a identificar qu le desencadena su reaccin y, luego, aprender las maneras de controlar su respuesta al [place]. Pensar en cmo reacciona ante ciertas situaciones. Es posible que no sea capaz de chief operating officer todo, pero puede corporate investment banker. Hacerse tiempo para las actividades que lo ayudan a lexicographer y no sentir culpa por pasar su tiempo de [place]. Crear imgenes visuales. Esto implica imaginar o crear imgenes mentales para ayudarlo a relajarse. Practicar yoga. A travs de las posturas de yoga, puede disminuir la tensin y [place].  Medicamentos Algunos medicamentos para la ansiedad: Antidepresivos. Por lo general, se recetan para el control diario a largo plazo. Medicamentos para la ansiedad. Estos se pueden agregar en casos graves, especialmente cuando ocurren crisis de angustia. Cuando se usan juntos, los medicamentos, la psicoterapia y las tcnicas de reduccin de la tensin pueden ser el tratamiento ms efectivo. Las  relaciones Las relaciones interpersonales pueden ser muy importantes para ayudar a su recuperacin. Pase ms tiempo interactuando con amigos y familiares de confianza. Piense en ir a terapia de pareja, si tiene una pareja, tomar clases de educacin familiar o ir a information systems manager. La terapia puede ayudarlos a usted y a los dems a comprender mejor su ansiedad. Cmo reconocer cambios en su ansiedad Cada persona responde de [place]  diferente al tratamiento de la ansiedad. Se dice que se est recuperado de la ansiedad cuando los sntomas se reducen y dejan de interferir en la vida diaria en el hogar o el trabajo. Esto podra significar que comenzar a radio producer lo siguiente: Tener mejor concentracin y atencin. Tener menos interferencia de la preocupacin en el pensamiento diario. Dormir mejor. Estar menos irritable. Tener ms energa. Tener progress energy. Trate de reconocer cundo su afeccin empeora. Comunquese con el mdico si sus sntomas interfieren en su hogar o su trabajo, y usted siente que su afeccin no est mejorando. Siga estas instrucciones en su casa: Actividad Actividad fsica. Los adultos deben hacer lo siguiente: Education Officer, Environmental, al menos, 150 minutos de actividad fsica por semana. El ejercicio debe aumentar la frecuencia cardaca y media planner transpirar (ejercicio de intensidad moderada). Hacer ejercicios de fortalecimiento por lo [facility] por semana. Duerma bien y por el tiempo adecuado. La harley-dav[employer]son de los adultos necesitan entre 7 y 9 horas de sueo todas las noches. Estilo de vida  Siga una dieta saludable que incluya abundantes frutas, verduras, cereales integrales, productos lcteos descremados y protenas magras. No consuma muchos alimentos con alto contenido de grasas, azcares agregados o sal (sodio). Opte por cosas que le simplifiquen la vida. No consuma ningn producto que contenga nicotina o tabaco. Estos productos incluyen cigarrillos, tabaco para theatre manager y aparatos de vapeo, como los cigarrillos electrnicos. Si necesita ayuda para dejar de fumar, consulte al mdico. Evite el consumo de cafena, alcohol y ciertos medicamentos contra el resfro de venta sin receta. Estos podran optician, dispensing. Pregntele al farmacutico qu medicamentos no debera tomar. Instrucciones generales Use los medicamentos de venta libre y los recetados solamente como se lo haya indicado el mdico. Concurra a todas  las visitas de seguimiento. Esto es para verificar que est controlando su ansiedad o si necesita ms apoyo. Dnde obtener apoyo Puede conseguir ayuda y apoyo de: Grupos de autoayuda. Organizaciones comunitarias y en lnea. Un lder espiritual de confianza. Terapia de pareja. Clases de educacin familiar. Terapia familiar. Dnde obtener ms informacin Formar parte de un grupo de apoyo podra resultarle til para enfrentar la ansiedad. Las siguientes fuentes pueden ayudarlo a clinical research associate consejeros o grupos de apoyo cerca de su hogar: Mental Health America (Salud Mental de los Estados Unidos): Altadena.net Anxiety and Depression Association of America [ADAA] (Asociacin de Ansiedad y Depresin de los Estados Unidos): adaa.org The First American on Mental Illness (NAMI) (Alianza Nacional Sobre Enfermedades Mentales): nami.org Comunquese con un mdico si: Le resulta difcil permanecer concentrado o finalizar las tareas. Pasa muchas horas por da sintindose preocupado por la vida cotidiana. Est muy cansado porque no puede dejar de preocuparse. Comienza a tener dolores de cabeza o se siente tenso con frecuencia. Tiene nuseas o diarrea crnicas. Solicite ayuda de inmediato si: Siente que tiene el corazn acelerado. Le falta el aire. Piensa acerca de lastimarse o lastimar a economist. Busque ayuda de inmediato si alguna vez siente que puede hacerse dao a usted mismo o a otros, o tiene pensamientos de patent examiner a su vida. Dirjase al ezella de  urgencias ms cercano o: Llame al 911. Llame a National Suicide Prevention Lifeline (Lnea Telefnica Nacional para la Prevencin del Suicidio) al [phone] o al 988. Est disponible las 24 horas del da. Enve un mensaje de texto a la lnea para casos de crisis al [phone]. Esta informacin no tiene theme park manager el consejo del mdico. Asegrese de hacerle al mdico cualquier pregunta que tenga. Document Revised: 06/12/2022  Document Reviewed: 12/14/2020 Elsevier Patient Education  2024 [employer].

## 2024-06-28 NOTE — Addendum Note (Signed)
 Addended by: VERMA SLOUGH D on: 06/28/2024 10:51 AM   Modules accepted: Orders

## 2024-06-28 NOTE — Telephone Encounter (Unsigned)
 Copied from CRM [id number]. Topic: Clinical - Medication Refill >> Jun 28, 2024  3:04 PM Everette C wrote: Medication: metFORMIN (GLUCOPHAGE-XR) 500 MG 24 hr tablet [[id number]]  Has the patient contacted their pharmacy? Yes (Agent: If no, request that the patient contact the pharmacy for the refill. If patient does not wish to contact the pharmacy document the reason why and proceed with request.) (Agent: If yes, when and what did the pharmacy advise?)  This is the patient's preferred pharmacy:  [facility] [address] ([address] [place] - [address] [place] ([place]) [place] [address] Phone: [phone] Fax: [phone]   Is this the correct pharmacy for this prescription? Yes If no, delete pharmacy and type the correct one.   Has the prescription been filled recently? Yes  Is the patient out of the medication? Yes  Has the patient been seen for an appointment in the last year OR does the patient have an upcoming appointment? Yes  Can we respond through MyChart? No  Agent: Please be advised that Rx refills may take up to 3 business days. We ask that you follow-up with your pharmacy.

## 2024-06-28 NOTE — Progress Notes (Signed)
 Established Patient Office Visit  Subjective   Patient ID: Jennifer Noble, female    DOB: 05-06-1976  Age: 48 y.o. MRN: [id number]  Chief Complaint  Patient presents with   Anxiety    Sje jas a history of anxiety and reports it has been getting worse in the last 2 weeks  Discussed the use of AI scribe software for clinical note transcription with the patient, who gave verbal consent to proceed.  History of Present Illness   Jennifer Noble is a 48 year old female who presents with increased anxiety and a recent panic attack.  She has experienced increased anxiety over the past couple of weeks, culminating in a panic attack last Thursday. She visited the emergency room and was prescribed Ativan, but states the prescription was canceled pending a follow-up. She continues to take Celexa  daily. She sleeps well, averaging about seven hours per night, but wakes up feeling anxious in the mornings. She also experiences sweating of the hands and feet, which she attributes to stress. Endorses new stressors contributing to her anxiety.  During a recent emergency room visit, her blood sugar was elevated at 218 mg/dL. Her A1c in May was 6.7%; she is not currently on any medication for this condition. She has the means to check her blood sugar at home but not her blood pressure. Does not check her BG at home at this time.   Due to language barrier, an interpreter was present during the history-taking and subsequent discussion (and for part of the physical exam) with this patient.    Past Medical History:  Diagnosis Date   Anxiety    Depression    Dysuria    Hemorrhoid    High cholesterol    Social History   Socioeconomic History   Marital status: Married    Spouse name: Not on file   Number of children: 3   Years of education: Not on file   Highest education level: 6th grade  Occupational History   Not on file  Tobacco Use   Smoking status: Never   Smokeless  tobacco: Never  Vaping Use   Vaping status: Never Used  Substance and Sexual Activity   Alcohol use: No   Drug use: No   Sexual activity: Yes    Birth control/protection: Post-menopausal  Other Topics Concern   Not on file  Social History Narrative   Not on file   Social Drivers of Health   Financial Resource Strain: Not on file  Food Insecurity: No Food Insecurity (06/28/2024)   Hunger Vital Sign    Worried About Running Out of Food in the Last Year: Never true    Ran Out of Food in the Last Year: Never true  Transportation Needs: No Transportation Needs (12/03/2022)   PRAPARE - Administrator, Civil Service (Medical): No    Lack of Transportation (Non-Medical): No  Physical Activity: Not on file  Stress: Not on file  Social Connections: Not on file  Intimate Partner Violence: Not on file   Family History  Problem Relation Age of Onset   Diabetes Mother    Diabetes Sister    Diabetes Brother    Heart disease Other    Breast cancer Neg Hx    Allergies  Allergen Reactions   Naproxen  Nausea And Vomiting    Review of Systems  Constitutional: Negative.   HENT: Negative.    Eyes: Negative.   Respiratory:  Negative for shortness of breath.  Cardiovascular:  Negative for chest pain.  Gastrointestinal: Negative.   Genitourinary: Negative.   Musculoskeletal: Negative.   Skin: Negative.   Neurological:  Negative for dizziness and weakness.  Endo/Heme/Allergies: Negative.   Psychiatric/Behavioral:  The patient is nervous/anxious. The patient does not have insomnia.       Objective:     BP (!) 140/76 (BP Location: Left Arm, Patient Position: Sitting)   Pulse 86   Ht 5' 2 (1.575 m)   Wt 171 lb (77.6 kg)   LMP 02/27/2021 (Approximate)   SpO2 98%   BMI 31.28 kg/m  BP Readings from Last 3 Encounters:  06/28/24 (!) 140/76  06/23/24 124/66  06/20/24 128/60   Wt Readings from Last 3 Encounters:  06/28/24 171 lb (77.6 kg)  06/18/24 177 lb (80.3 kg)   01/05/24 169 lb 12.1 oz (77 kg)    Physical Exam Vitals and nursing note reviewed.    GENERAL: Alert, cooperative, well developed, no acute distress. HEENT: Normocephalic, normal oropharynx, moist mucous membranes. NECK: Supple, no tenderness. CHEST: Clear to auscultation bilaterally, no wheezes, rhonchi, or crackles. CARDIOVASCULAR: Normal heart rate and rhythm, S1 and S2 normal without murmurs. EXTREMITIES: No cyanosis or edema. NEUROLOGICAL: Cranial nerves grossly intact, moves all extremities without gross motor or sensory deficit.   Assessment & Plan:   Problem List Items Addressed This Visit   None Visit Diagnoses       Type 2 diabetes mellitus with hyperglycemia, without long-term current use of insulin (HCC)    -  Primary   Relevant Medications   metFORMIN (GLUCOPHAGE-XR) 500 MG 24 hr tablet   Other Relevant Orders   Microalbumin / creatinine urine ratio   Vitamin D , 25-hydroxy     GAD (generalized anxiety disorder)       Relevant Medications   hydrOXYzine  (ATARAX ) 10 MG tablet   Other Relevant Orders   TSH   Vitamin D , 25-hydroxy     Elevated blood pressure reading in office without diagnosis of hypertension          1. Type 2 diabetes mellitus with hyperglycemia, without long-term current use of insulin (HCC) (Primary) Patient with A1c was 6.7 in May 2025, today is 6.0.  Is reasonable to start on metformin at this time.  Encouraged patient to check blood glucose levels at home, keep a written log and have available for all office visits.  States that she has blood glucose measuring supplies.  Patient has follow-up appointment with primary care provider on July 09, 2024.  Encouraged to keep appointment, and return to mobile unit as needed.  Red flags given for prompt reevaluation - Microalbumin / creatinine urine ratio - metFORMIN (GLUCOPHAGE-XR) 500 MG 24 hr tablet; Take 1 tablet (500 mg total) by mouth daily with breakfast.  Dispense: 30 tablet; Refill: 1 -  Vitamin D , 25-hydroxy  2. GAD (generalized anxiety disorder) Trial hydroxyzine .  Patient does have good sleep hygiene.  Will rule out thyroid issues, consider increasing Celexa  if no improvement.  Patient education given on supportive care.  - TSH - hydrOXYzine  (ATARAX ) 10 MG tablet; Take 1 tablet (10 mg total) by mouth 3 (three) times daily as needed.  Dispense: 45 tablet; Refill: 0 - Vitamin D , 25-hydroxy  3. Elevated blood pressure reading in office without diagnosis of hypertension Encouraged patient to check blood pressure at home, keep a written log and have available for all office visits.   I have reviewed the patient's medical history (PMH, PSH, Social History, Family History, Medications, and  allergies) , and have been updated if relevant. I spent 30 minutes reviewing chart and  face to face time with patient.    Return in about 11 days (around 07/09/2024) for [facility].    Kirk RAMAN Mayers, PA-C

## 2024-06-28 NOTE — Telephone Encounter (Signed)
 Copied from CRM [id number]. Topic: Clinical - Medication Question >> Jun 28, 2024  3:08 PM Jennifer Noble wrote:  Reason for CRM: The patient would like to be contacted to review the directions for their recently prescribed hydrOXYzine  (ATARAX ) 10 MG tablet [[id number]]. Please contact when possible

## 2024-06-29 ENCOUNTER — Ambulatory Visit: Payer: Self-pay

## 2024-06-29 NOTE — Telephone Encounter (Signed)
 PCP out of office, routing to covering provider.  Please advise on next steps.

## 2024-06-29 NOTE — Telephone Encounter (Signed)
 Spanish DANTON Dess # [phone] utilized for this call   Soonest available OV is in December. Please advise if able to fit in sooner or if provider can try a different medication. Also requesting lab results.   FYI Only or Action Required?: Action required by provider: clinical question for provider.  Patient was last seen in primary care on 06/28/2024 by Mayers, Kirk RAMAN, PA-C.  Called Nurse Triage reporting Medication Reaction.  Symptoms began chronic.  Interventions attempted: Prescription medications: hydroxyzine .  Symptoms are: unchanged.  Triage Disposition: Discuss With PCP and Callback by Nurse Today (overriding Call PCP When Office is Open)  Patient/caregiver understands and will follow disposition?: Yes  Reason for Disposition  [1] Caller has NON-URGENT medicine question about med that PCP prescribed AND [2] triager unable to answer question  Answer Assessment - Initial Assessment Questions 1. NAME of MEDICINE: What medicine(s) are you calling about?     Hydroxyzine   2. QUESTION: What is your question? (e.g., double dose of medicine, side effect)     States has been experiencing fatigue and diarrhea since starting hydroxyzine  yesterday.   3. PRESCRIBER: Who prescribed the medicine? Reason: if prescribed by specialist, call should be referred to that group.     [facility]  4. SYMPTOMS: Do you have any symptoms? If Yes, ask: What symptoms are you having?  How bad are the symptoms (e.g., mild, moderate, severe)     Fatigue and diarrhea  Protocols used: Medication Question Call-A-AH   Message from Amy B sent at 06/29/2024 12:21 PM EST  Reason for Triage: Patient states hydroxyzine  is causing excessive sleepiness and diarrhea.  Requests a call back to discuss.  Patient is Spanish speaking only

## 2024-06-30 ENCOUNTER — Ambulatory Visit: Payer: Self-pay | Admitting: Physician Assistant

## 2024-06-30 ENCOUNTER — Ambulatory Visit: Payer: Self-pay

## 2024-06-30 LAB — MICROALBUMIN / CREATININE URINE RATIO
Creatinine, Urine: 53.5 mg/dL
Microalb/Creat Ratio: 9 mg/g{creat} (ref 0–29)
Microalbumin, Urine: 4.8 ug/mL

## 2024-06-30 LAB — VITAMIN D 25 HYDROXY (VIT D DEFICIENCY, FRACTURES): Vit D, 25-Hydroxy: 24 ng/mL — ABNORMAL LOW (ref 30.0–100.0)

## 2024-06-30 LAB — TSH: TSH: 0.853 u[IU]/mL (ref 0.450–4.500)

## 2024-06-30 NOTE — Telephone Encounter (Signed)
 FYI Only or Action Required?: Action required by provider: request for appointment and clinical question for provider.  Patient was last seen in primary care on 06/28/2024 by Mayers, Kirk RAMAN, PA-C.  Called Nurse Triage reporting Advice Only.  Symptoms began chronic.  Interventions attempted: Nothing.  Symptoms are: gradually worsening.  Triage Disposition: Discuss With PCP and Callback by Nurse Today (overriding Call PCP When Office is Open)  Patient/caregiver understands and will follow disposition?: Yes Reason for Disposition  [1] Caller has NON-URGENT medicine question about med that PCP prescribed AND [2] triager unable to answer question  Answer Assessment - Initial Assessment Questions 1. NAME of MEDICINE: What medicine(s) are you calling about?  Spanish Int Roena [phone] utilized for this call. Pt needs PCP visit per encounter yesterday.   Patient has appt 11/21 with PCP regarding medications. Advised to d/c hydroxyzine  but states the metformin is also making her tired.   Requesting a sooner appointment.  Protocols used: Medication Question Call-A-AH  Copied from CRM #[id number]. Topic: Clinical - Red Word Triage >> Jun 30, 2024  8:36 AM Avram MATSU wrote: Red Word that prompted transfer to Nurse Triage: patient has a lot of anxiety, was pink word yesterday and I saw a note she needs to see her pcp for a follow up   ----------------------------------------------------------------------- From previous Reason for Contact - Scheduling: Patient/patient representative is calling to schedule an appointment. Refer to attachments for appointment information.

## 2024-06-30 NOTE — Telephone Encounter (Signed)
 Patient aware to discontinue medication: Hydroxyzine .

## 2024-06-30 NOTE — Telephone Encounter (Signed)
 Duplicate request- rx 06/28/24 #30 1RF- outside provider Requested Prescriptions  Pending Prescriptions Disp Refills   metFORMIN (GLUCOPHAGE-XR) 500 MG 24 hr tablet 30 tablet 1    Sig: Take 1 tablet (500 mg total) by mouth daily with breakfast.     Endocrinology:  Diabetes - Biguanides Failed - 06/30/2024  3:01 PM      Failed - B12 Level in normal range and within 720 days    No results found for: VITAMINB12       Passed - Cr in normal range and within 360 days    Creatinine, Ser  Date Value Ref Range Status  06/20/2024 0.47 0.44 - 1.00 mg/dL Final         Passed - HBA1C is between 0 and 7.9 and within 180 days    Hemoglobin A1C  Date Value Ref Range Status  06/28/2024 6.0 (A) 4.0 - 5.6 % Final   HbA1c, POC (prediabetic range)  Date Value Ref Range Status  06/28/2024 6.0 5.7 - 6.4 % Final   HbA1c, POC (controlled diabetic range)  Date Value Ref Range Status  06/28/2024 6.0 0.0 - 7.0 % Final   HbA1c POC (<> result, manual entry)  Date Value Ref Range Status  06/28/2024 6.0 4.0 - 5.6 % Final         Passed - eGFR in normal range and within 360 days    GFR calc Af Amer  Date Value Ref Range Status  11/23/2019 139 >59 mL/min/1.73 Final   GFR, Estimated  Date Value Ref Range Status  06/20/2024 >60 >60 mL/min Final    Comment:    (NOTE) Calculated using the CKD-EPI Creatinine Equation (2021)    eGFR  Date Value Ref Range Status  01/02/2024 115 >59 mL/min/1.73 Final         Passed - Valid encounter within last 6 months    Recent Outpatient Visits           6 months ago Anxiety and depression   [facility] [place]. [address] Theotis Haze ORN, NP   1 year ago Bacterial sinusitis   [facility] [facility] [place]. [facility] Theotis Haze ORN, NP   1 year ago Trochanteric bursitis of left hip   [facility] [place]. [facility] [place], Jon HERO,  [place]   1 year ago Encounter for annual physical exam   [facility] [place] [facility] [place]. [facility] Theotis Haze ORN, NP   3 years ago Physical exam   [facility] [place]. [facility] [place], Haze ORN, NP              Passed - CBC within normal limits and completed in the last 12 months    WBC  Date Value Ref Range Status  06/20/2024 9.8 4.0 - 10.5 K/uL Final   RBC  Date Value Ref Range Status  06/20/2024 4.49 3.87 - 5.11 MIL/uL Final   Hemoglobin  Date Value Ref Range Status  06/20/2024 13.5 12.0 - 15.0 g/dL Final  94/83/7974 85.7 11.1 - 15.9 g/dL Final   HCT  Date Value Ref Range Status  06/20/2024 41.2 36.0 - 46.0 % Final   Hematocrit  Date Value Ref Range Status  01/02/2024 43.5 34.0 - 46.6 % Final   MCHC  Date Value Ref Range Status  06/20/2024 32.8 30.0 - 36.0 g/dL Final   [facility]  Date Value Ref Range Status  06/20/2024 30.1 26.0 - 34.0 pg Final   MCV  Date Value Ref Range Status  06/20/2024 91.8 80.0 - 100.0 fL Final  01/02/2024 91 79 - 97 fL Final   No results found for: PLTCOUNTKUC, LABPLAT, POCPLA RDW  Date Value Ref Range Status  06/20/2024 13.2 11.5 - 15.5 % Final  01/02/2024 12.7 11.7 - 15.4 % Final

## 2024-07-07 ENCOUNTER — Telehealth: Payer: Self-pay | Admitting: Nurse Practitioner

## 2024-07-07 NOTE — Telephone Encounter (Signed)
 Pt confirmed appt (per vr)

## 2024-07-09 ENCOUNTER — Telehealth: Payer: Self-pay | Admitting: Nurse Practitioner

## 2024-07-09 ENCOUNTER — Ambulatory Visit: Payer: Self-pay | Attending: Nurse Practitioner | Admitting: Nurse Practitioner

## 2024-07-09 ENCOUNTER — Encounter: Payer: Self-pay | Admitting: Nurse Practitioner

## 2024-07-09 ENCOUNTER — Other Ambulatory Visit: Payer: Self-pay

## 2024-07-09 VITALS — BP 115/75 | HR 71 | Resp 19 | Ht 62.0 in | Wt 167.8 lb

## 2024-07-09 DIAGNOSIS — M542 Cervicalgia: Secondary | ICD-10-CM

## 2024-07-09 DIAGNOSIS — K089 Disorder of teeth and supporting structures, unspecified: Secondary | ICD-10-CM

## 2024-07-09 DIAGNOSIS — G8929 Other chronic pain: Secondary | ICD-10-CM

## 2024-07-09 DIAGNOSIS — H11002 Unspecified pterygium of left eye: Secondary | ICD-10-CM

## 2024-07-09 DIAGNOSIS — F419 Anxiety disorder, unspecified: Secondary | ICD-10-CM

## 2024-07-09 DIAGNOSIS — F32A Depression, unspecified: Secondary | ICD-10-CM

## 2024-07-09 MED ORDER — CITALOPRAM HYDROBROMIDE 40 MG PO TABS
40.0000 mg | ORAL_TABLET | Freq: Every day | ORAL | 1 refills | Status: AC
  Filled 2024-07-09: qty 90, 90d supply, fill #0

## 2024-07-09 NOTE — Progress Notes (Signed)
 Assessment & Plan:  Jennifer Noble was seen today for medical management of chronic issues.  Diagnoses and all orders for this visit:  Anxiety and depression -     citalopram  (CELEXA ) 40 MG tablet; Take 1 tablet (40 mg total) by mouth daily.  Chronic neck pain -     DG Cervical Spine Complete; Future -     Ambulatory referral to Orthopedic Surgery  Pterygium of left eye -     Ambulatory referral to Ophthalmology    Patient has been counseled on age-appropriate routine health concerns for screening and prevention. These are reviewed and up-to-date. Referrals have been placed accordingly. Immunizations are up-to-date or declined.    Subjective:   Chief Complaint  Patient presents with   Medical Management of Chronic Issues    History of Present Illness Jennifer Noble is a 48 year old female who presents with anxiety and stress-related symptoms.  She has been experiencing significant stress for the past three weeks, which began after a discussion with a coworker. This stress led to symptoms such as feeling very warm and having hot ears, prompting a visit to the emergency room. In the ER, she was given ATIVAN  which alleviated her symptoms but caused excessive sleepiness, and she does not wish to continue this medication. She is currently taking celexa  20 mg daily which she does not feel is effective.  She continues to experience anxiety symptoms, including a tingling sensation in her neck, hot ears, and sweating in her hands and feet, particularly in the mornings. Hydroxyzine  caused diarrhea and she stopped taking this   During her ER visit, she was informed of slightly elevated blood sugar levels. She was previously prescribed metformin  after a mobile clinic visit where her A1c was reported as 6.0. She would like to know if she needs to continue metformin .  She reports discomfort in her left eye associated with a pterygium.  Described as feeling like there is discharge or  something moving in it. She was advised at a [facility] center that she might need surgery for this issue, which she believes could be cataracts or another condition causing visual disturbance.   VRI was used to communicate directly with patient for the entire encounter including providing detailed patient instructions.    She reports feeling in her neck despite taking gabapentin .  She has had neck pain on and off for several years with MRI in 2016.  Pain is constant, worsens with movement and radiates down her arms. She has not had weakness in the arms. There has not been any injury.  She has been treated in the past with tramadol , po steroids and anti-inflammatories. C4-5: Small central disc protrusion. Negative for spinal or foraminal stenosis  C5-6: Small central disc protrusion without spinal or foraminal stenosis  C6-7: Small central disc protrusion without spinal or foraminal stenosis      Review of Systems  Constitutional:  Negative for fever, malaise/fatigue and weight loss.  HENT: Negative.  Negative for nosebleeds.   Eyes:  Positive for blurred vision. Negative for double vision and photophobia.  Respiratory: Negative.  Negative for cough and shortness of breath.   Cardiovascular: Negative.  Negative for chest pain, palpitations and leg swelling.  Gastrointestinal: Negative.  Negative for heartburn, nausea and vomiting.  Musculoskeletal:  Positive for neck pain. Negative for myalgias.  Neurological:  Positive for tingling and sensory change. Negative for dizziness, focal weakness, seizures and headaches.  Psychiatric/Behavioral:  Positive for depression. Negative for suicidal ideas. The  patient is nervous/anxious.     Past Medical History:  Diagnosis Date   Anxiety    Depression    Dysuria    Hemorrhoid    High cholesterol     Past Surgical History:  Procedure Laterality Date   GLUTEUS MINIMUS REPAIR Left 01/05/2024   Procedure: REPAIR, TENDON, GLUTEUS MINIMUS;   Surgeon: Genelle Standing, MD;  Location: [facility];  Service: Orthopedics;  Laterality: Left;  LEFT GLUTEUS MEDIUS REPAIR AND POSSIBLE COLLAGEN PATCH AUGMENTATION   HEMORRHOID SURGERY      Family History  Problem Relation Age of Onset   Diabetes Mother    Diabetes Sister    Diabetes Brother    Heart disease Other    Breast cancer Neg Hx     Social History Reviewed with no changes to be made today.   Outpatient Medications Prior to Visit  Medication Sig Dispense Refill   citalopram  (CELEXA ) 20 MG tablet Take 1 tablet (20 mg total) by mouth daily. 90 tablet 3   metFORMIN  (GLUCOPHAGE -XR) 500 MG 24 hr tablet Take 1 tablet (500 mg total) by mouth daily with breakfast. 30 tablet 1   aspirin  EC 325 MG tablet Take 1 tablet (325 mg total) by mouth daily. (Patient not taking: Reported on 07/09/2024) 14 tablet 0   gabapentin  (NEURONTIN ) 300 MG capsule Take 1 capsule (300 mg total) by mouth 2 (two) times daily. (Patient not taking: Reported on 07/09/2024) 60 capsule 1   hydrOXYzine  (ATARAX ) 10 MG tablet Take 1 tablet (10 mg total) by mouth 3 (three) times daily as needed. (Patient not taking: Reported on 07/09/2024) 45 tablet 0   LORazepam  (ATIVAN ) 0.5 MG tablet Take 1 tablet (0.5 mg total) by mouth 2 (two) times daily as needed for anxiety. (Patient not taking: Reported on 07/09/2024) 5 tablet 0   nitrofurantoin , macrocrystal-monohydrate, (MACROBID ) 100 MG capsule Take 1 capsule (100 mg total) by mouth 2 (two) times daily. (Patient not taking: Reported on 07/09/2024) 10 capsule 0   nitroGLYCERIN  (NITRODUR - DOSED IN MG/24 HR) 0.2 mg/hr patch Cut patch into fourths. Place 1/4 patch over affected area on hip. Change every 24 hours. (Patient not taking: Reported on 07/09/2024) 30 patch 0   oxyCODONE  (ROXICODONE ) 5 MG immediate release tablet Take 1 tablet (5 mg total) by mouth every 4 (four) hours as needed for severe pain (pain score 7-10) or breakthrough pain. (Patient not taking:  Reported on 07/09/2024) 15 tablet 0   No facility-administered medications prior to visit.    Allergies  Allergen Reactions   Naproxen  Nausea And Vomiting       Objective:    BP 115/75 (BP Location: Left Arm, Patient Position: Sitting, Cuff Size: Normal)   Pulse 71   Resp 19   Ht 5' 2 (1.575 m)   Wt 167 lb 12.8 oz (76.1 kg)   LMP 02/27/2021 (Approximate)   SpO2 100%   BMI 30.69 kg/m  Wt Readings from Last 3 Encounters:  07/09/24 167 lb 12.8 oz (76.1 kg)  06/28/24 171 lb (77.6 kg)  06/18/24 177 lb (80.3 kg)    Physical Exam Vitals and nursing note reviewed.  Constitutional:      Appearance: She is well-developed.  HENT:     Head: Normocephalic and atraumatic.  Eyes:      Comments: pterygium  Cardiovascular:     Rate and Rhythm: Normal rate and regular rhythm.     Heart sounds: Normal heart sounds. No murmur heard.    No friction  rub. No gallop.  Pulmonary:     Effort: Pulmonary effort is normal. No tachypnea or respiratory distress.     Breath sounds: Normal breath sounds. No decreased breath sounds, wheezing, rhonchi or rales.  Chest:     Chest wall: No tenderness.  Musculoskeletal:        General: Normal range of motion.     Cervical back: Normal range of motion.  Skin:    General: Skin is warm and dry.  Neurological:     Mental Status: She is alert and oriented to person, place, and time.     Coordination: Coordination normal.  Psychiatric:        Behavior: Behavior normal. Behavior is cooperative.        Thought Content: Thought content normal.        Judgment: Judgment normal.          Patient has been counseled extensively about nutrition and exercise as well as the importance of adherence with medications and regular follow-up. The patient was given clear instructions to go to ER or return to medical center if symptoms don't improve, worsen or new problems develop. The patient verbalized understanding.   Follow-up: Return in about 14 weeks  (around 10/15/2024).   Haze LELON Servant, FNP-BC [facility] [place], [place] [phone]   07/09/2024, 12:41 PM

## 2024-07-09 NOTE — Telephone Encounter (Signed)
 Patient is requesting a Dental Referral please advise.

## 2024-07-09 NOTE — Addendum Note (Signed)
 Addended by: LIANE JACKOLYN GAILS on: 07/09/2024 02:34 PM   Modules accepted: Orders

## 2024-07-09 NOTE — Telephone Encounter (Signed)
 Noted

## 2024-07-09 NOTE — Telephone Encounter (Signed)
 Referral sent

## 2024-07-09 NOTE — Patient Instructions (Addendum)
 NATURAL REMEDIES FOR GLUCOSE LOWERING GINGER CINNAMON FENUGREEK  APPLE CIDER VINEGAR

## 2024-07-26 ENCOUNTER — Telehealth: Payer: Self-pay | Admitting: Nurse Practitioner

## 2024-07-26 NOTE — Telephone Encounter (Signed)
 Copied from CRM [id number]. Topic: Referral - Status >> Jul 26, 2024  3:19 PM Winona R wrote:  Pt calling to follow up on eye surgeon as she has already seen a optometrist

## 2024-07-27 ENCOUNTER — Encounter: Payer: Self-pay | Admitting: Nurse Practitioner

## 2024-07-27 NOTE — Telephone Encounter (Signed)
Return call unanswered.  

## 2024-08-02 ENCOUNTER — Ambulatory Visit: Payer: Self-pay | Admitting: Physical Medicine and Rehabilitation

## 2024-08-02 ENCOUNTER — Encounter: Payer: Self-pay | Admitting: Physical Medicine and Rehabilitation

## 2024-08-02 DIAGNOSIS — M545 Low back pain, unspecified: Secondary | ICD-10-CM

## 2024-08-02 DIAGNOSIS — M546 Pain in thoracic spine: Secondary | ICD-10-CM

## 2024-08-02 DIAGNOSIS — M7918 Myalgia, other site: Secondary | ICD-10-CM

## 2024-08-02 DIAGNOSIS — M542 Cervicalgia: Secondary | ICD-10-CM

## 2024-08-02 MED ORDER — METHOCARBAMOL 500 MG PO TABS: 500.0000 mg | ORAL_TABLET | Freq: Three times a day (TID) | ORAL | 0 refills | Status: AC

## 2024-08-02 MED ORDER — MELOXICAM 15 MG PO TABS: 15.0000 mg | ORAL_TABLET | Freq: Every day | ORAL | 0 refills | Status: AC

## 2024-08-02 MED ORDER — PREDNISONE 50 MG PO TABS: 50.0000 mg | ORAL_TABLET | Freq: Every day | ORAL | 0 refills | Status: AC

## 2024-08-02 NOTE — Progress Notes (Unsigned)
 Pain Scale   Average Pain 8 Patient advising she has middle back pain that radiates to both hips.        +Driver, -BT, -Dye Allergies.

## 2024-08-02 NOTE — Progress Notes (Unsigned)
 Jennifer Noble - 48 y.o. female MRN [id number]  Date of birth: [birth year]  Office Visit Note: Visit Date: 08/02/2024 PCP: Theotis Haze ORN, NP Referred by: Theotis Haze ORN, NP  Subjective: Chief Complaint  Patient presents with   Middle Back - Pain   HPI: Jennifer Noble is a 48 y.o. female who comes in today per the request of Haze Theotis, NP for evaluation of acute bilateral neck pain radiating to middle and lower back. She has pain radiating down entire spine. No pain radiating down arms or legs. Spanish interpreter at bedside. Pain started 1.5 months ago after mechanical fall. States she tripped over belt on the floor and hit head on wall. She was evaluated in the emergency department next day for evaluation. Right shoulder x-ray in ED was normal. Her back pain worsens with activity. She describes pain as sore and tight sensation, currently rates as 8 out of 10. Some relief of pain with home exercise regimen, rest and use of medications. Some relief of pain with Tylenol  and Motrin . No history of formal physical therapy/chiropractic treatments. Her pain has improved from onset, however her back continues to feels sore. Patient denies focal weakness, numbness and tingling. No recent trauma or falls.      Review of Systems  Musculoskeletal:  Positive for back pain, myalgias and neck pain.  Neurological:  Negative for tingling, sensory change, focal weakness and weakness.  All other systems reviewed and are negative.  Otherwise per HPI.  Assessment & Plan: Visit Diagnoses:    ICD-10-CM   1. Neck pain  M54.2 Ambulatory referral to Physical Therapy    2. Acute bilateral thoracic back pain  M54.6 Ambulatory referral to Physical Therapy    3. Acute bilateral low back pain without sciatica  M54.50 Ambulatory referral to Physical Therapy    4. Myofascial pain syndrome  M79.18 Ambulatory referral to Physical Therapy       Plan: Findings:  Acute bilateral neck  pain radiating to middle and lower back post mechanical fall. No pain radiating down the arms or legs. Patient continues to have pain despite good conservative therapies such as home exercise regimen, rest and use of medications. Patients clinical presentation and exam are consistent with myofascial strain injury, there is myofascial tenderness to entire spine today, most prominent to thoracic and lumbar paraspinal regions. We discussed treatment plan in detail today. I prescribed short course of oral Prednisone , Robaxin  and meloxicam . Advised her to complete oral Prednisone  before started meloxicam . I also placed order for short course of formal physical therapy. I do think she would benefit from manual treatments and strengthening exercises. I explained to her that myofascial strain injuries can take 6-8 weeks to heal. I would like to see her back in approximately 10 weeks for follow up post physical therapy. She has no questions today. No red flag symptoms noted upon exam today.     Meds & Orders:  Meds ordered this encounter  Medications   predniSONE  (DELTASONE ) 50 MG tablet    Sig: Take 1 tablet (50 mg total) by mouth daily with breakfast. Take until completed.    Dispense:  5 tablet    Refill:  0   meloxicam  (MOBIC ) 15 MG tablet    Sig: Take 1 tablet (15 mg total) by mouth daily.    Dispense:  30 tablet    Refill:  0   methocarbamol  (ROBAXIN ) 500 MG tablet    Sig: Take 1 tablet (500 mg total) by mouth  3 (three) times daily.    Dispense:  90 tablet    Refill:  0    Orders Placed This Encounter  Procedures   Ambulatory referral to Physical Therapy    Follow-up: Return for 10 week follow up post physical therapy.   Procedures: No procedures performed      Clinical History: No specialty comments available.   She reports that she has never smoked. She has never used smokeless tobacco.  Recent Labs    06/28/24 1051  HGBA1C 6.0  6.0  6.0  6.0*    Objective:  VS:  HT:    WT:    BMI:     BP:   HR: bpm  TEMP: ( )  RESP:  Physical Exam Vitals and nursing note reviewed.  HENT:     Head: Normocephalic and atraumatic.     Right Ear: External ear normal.     Left Ear: External ear normal.     Nose: Nose normal.     Mouth/Throat:     Mouth: Mucous membranes are moist.  Eyes:     Extraocular Movements: Extraocular movements intact.  Cardiovascular:     Rate and Rhythm: Normal rate.     Pulses: Normal pulses.  Pulmonary:     Effort: Pulmonary effort is normal.  Abdominal:     General: Abdomen is flat. There is no distension.  Musculoskeletal:        General: Tenderness present.     Cervical back: Tenderness present.     Comments: Patient rises from seated position to standing without difficulty. Good lumbar range of motion. No pain noted with facet loading. 5/5 strength noted with bilateral hip flexion, knee flexion/extension, ankle dorsiflexion/plantarflexion and EHL. No clonus noted bilaterally. No pain upon palpation of greater trochanters. No pain with internal/external rotation of bilateral hips. Sensation intact bilaterally. Myofascial tenderness noted to bilateral cervical, thoracic and lumbar paraspinal regions upon palpation. No spinous process tenderness. Negative slump test bilaterally. Ambulates without aid, gait steady.     Skin:    General: Skin is warm and dry.     Capillary Refill: Capillary refill takes less than 2 seconds.  Neurological:     General: No focal deficit present.     Mental Status: She is alert and oriented to person, place, and time.  Psychiatric:        Mood and Affect: Mood normal.        Behavior: Behavior normal.     Ortho Exam  Imaging: No results found.  Past Medical/Family/Surgical/Social History: Medications & Allergies reviewed per EMR, new medications updated. Patient Active Problem List   Diagnosis Date Noted   Tear of left gluteus minimus tendon 01/05/2024   Vasomotor symptoms due to menopause 12/03/2022    Headache 05/07/2016   Depression 08/17/2015   Insomnia 08/17/2015   Elevated lipids 04/08/2013   Family history of early CAD 04/08/2013   Hemorrhoid    Dysuria    Past Medical History:  Diagnosis Date   Anxiety    Depression    Dysuria    Hemorrhoid    High cholesterol    Family History  Problem Relation Age of Onset   Diabetes Mother    Diabetes Sister    Diabetes Brother    Heart disease Other    Breast cancer Neg Hx    Past Surgical History:  Procedure Laterality Date   GLUTEUS MINIMUS REPAIR Left 01/05/2024   Procedure: REPAIR, TENDON, GLUTEUS MINIMUS;  Surgeon: Genelle Standing, MD;  Location: [facility];  Service: Orthopedics;  Laterality: Left;  LEFT GLUTEUS MEDIUS REPAIR AND POSSIBLE COLLAGEN PATCH AUGMENTATION   HEMORRHOID SURGERY     Social History   Occupational History   Not on file  Tobacco Use   Smoking status: Never   Smokeless tobacco: Never  Vaping Use   Vaping status: Never Used  Substance and Sexual Activity   Alcohol use: No   Drug use: No   Sexual activity: Yes    Birth control/protection: Post-menopausal

## 2024-08-06 ENCOUNTER — Encounter: Payer: Self-pay | Admitting: *Deleted

## 2024-08-06 ENCOUNTER — Ambulatory Visit: Payer: Self-pay

## 2024-08-06 ENCOUNTER — Ambulatory Visit
Admission: EM | Admit: 2024-08-06 | Discharge: 2024-08-06 | Disposition: A | Discharge status: Home / Self Care | Payer: Self-pay | Attending: Emergency Medicine | Admitting: Emergency Medicine

## 2024-08-06 DIAGNOSIS — J069 Acute upper respiratory infection, unspecified: Secondary | ICD-10-CM

## 2024-08-06 LAB — POC COVID19/FLU A&B COMBO
Covid Antigen, POC: NEGATIVE
Influenza A Antigen, POC: NEGATIVE
Influenza B Antigen, POC: NEGATIVE

## 2024-08-06 NOTE — ED Triage Notes (Signed)
 Spanish interpreter used for clinical intake: Jennifer Noble [phone]  Pt reports 3 days of eyes watering and red, with nose running. Slight cough today. Denies fever. She has tried OTC meds without relief.

## 2024-08-06 NOTE — Discharge Instructions (Addendum)
 Hoy la atendimos por congestin nasal e irritacin ocular. La prueba de COVID-19 y de influenza dio negativa, y sospecho que sus sntomas probablemente se deban a una infeccin respiratoria viral. Ladora recomiendo que contine controlando sus sntomas en casa con medicamentos de venta libre como Tylenol , ibuprofeno y Mucinex. Tambin puede tomar un antihistamnico como Claritin o Zyrtec para technical sales engineer picazn en los ojos y los odos. Si presenta sntomas nuevos o un empeoramiento de los sntomas, consulte con su mdico de information systems manager. Acuda a la sala de emergencias ms cercana si presenta sntomas graves como dificultad para respirar, engineer, mining en el pecho o debilidad importante.   You were seen today for concerns of congestion and eye irritation.  You were negative for COVID-19, influenza, and I suspect your symptoms are likely due to a viral respiratory infection causing the symptoms.  I would recommend continuing managing your symptoms at home with over-the-counter medications such as Tylenol , ibuprofen , Mucinex.  You can also add an antihistamine such as Claritin or Zyrtec to help with the itching in your eyes and ears.  For any concerns of new or worsening symptoms, please follow-up closely with your primary care provider.  Go to the nearest emergency department for any concerns of worsening symptoms such as shortness of breath, chest pain, or significant weakness.

## 2024-08-06 NOTE — Telephone Encounter (Signed)
 FYI Only or Action Required?: FYI only for provider: UC advised d/t no availability in office today  .  Patient was last seen in primary care on 07/09/2024 by Theotis Haze ORN, NP.  Called Nurse Triage reporting Nasal Congestion and Facial Pain.  Symptoms began several days ago.  Interventions attempted: OTC medications: sudafed.  Symptoms are: unchanged.  Triage Disposition: See HCP Within 4 Hours (Or PCP Triage)  Patient/caregiver understands and will follow disposition?: Yes             Reason for Disposition  [1] SEVERE sinus pain (e.g., excruciating) AND [2] not improved 2 hours after pain medicine  Answer Assessment - Initial Assessment Questions Patient verbalized understanding and agrees with plan to go to UC following this call.     1. LOCATION: Where does it hurt?      B/w eyebrows and forehead  2. ONSET: When did the sinus pain start?  (e.g., hours, days)      3 days ago nasal congestion pain b/w eyebrows and forehead   3. SEVERITY: How bad is the pain?   (Scale 0-10; or none, mild, moderate or severe)     8/10  4. RECURRENT SYMPTOM: Have you ever had sinus problems before? If Yes, ask: When was the last time? and What happened that time?      Mentions has had flu like sxs in the past , and has need medicine for sinusitus  5. NASAL CONGESTION: Is the nose blocked? If Yes, ask: Can you open it or must you breathe through your mouth?     Yes nose is blocked can be cleared , can breath normally out of mouth, 6. NASAL DISCHARGE: Do you have discharge from your nose? If so ask, What color?     Yes  7. FEVER: Do you have a fever? If Yes, ask: What is it, how was it measured, and when did it start?      Denies  8. OTHER SYMPTOMS: Do you have any other symptoms? (e.g., sore throat, cough, earache, difficulty breathing)     Cough started today, nasal congestion, face b/w eyes and forehead , mild redness swelling of the forehead around  eyes   Denies chest pain, difficulty breathing, fever  Protocols used: Sinus Pain or Congestion-A-AH  Copied from CRM #[id number]. Topic: Clinical - Red Word Triage >> Aug 06, 2024  8:19 AM Antwanette L wrote: Red Word that prompted transfer to Nurse Triage: The patient reports experiencing flu-like symptoms for the past three days. Current symptoms include cough, nasal congestion, redness of the eyes, and pain localized to the forehead >> Aug 06, 2024  8:21 AM Antwanette L wrote: [place] [phone]) [employer]

## 2024-08-06 NOTE — ED Provider Notes (Signed)
 " [facility]    CSN: [id number] Arrival date & time: 08/06/24  0913      History   Chief Complaint No chief complaint on file.   HPI Jennifer Noble is a 48 y.o. female.  Patient with significant medical history presents to the emergency department today with concerns of congestion, itchy eyes, and cough.  Reports symptoms started about 3 days ago with development of cough today.  No reported fever, chills or bodyaches.  Has been trying over-the-counter medications with minimal improvement in symptoms.  Denies any obvious sick contacts as far as she is aware.  No reported chest pain, shortness of breath, nausea, vomiting, or diarrhea.  Spanish interpreter,  Foy [phone], used for encounter.  HPI  Past Medical History:  Diagnosis Date   Anxiety    Depression    Dysuria    Hemorrhoid    High cholesterol     Patient Active Problem List   Diagnosis Date Noted   Tear of left gluteus minimus tendon 01/05/2024   Vasomotor symptoms due to menopause 12/03/2022   Headache 05/07/2016   Depression 08/17/2015   Insomnia 08/17/2015   Elevated lipids 04/08/2013   Family history of early CAD 04/08/2013   Hemorrhoid    Dysuria     Past Surgical History:  Procedure Laterality Date   GLUTEUS MINIMUS REPAIR Left 01/05/2024   Procedure: REPAIR, TENDON, GLUTEUS MINIMUS;  Surgeon: Genelle Standing, MD;  Location: [facility];  Service: Orthopedics;  Laterality: Left;  LEFT GLUTEUS MEDIUS REPAIR AND POSSIBLE COLLAGEN PATCH AUGMENTATION   HEMORRHOID SURGERY      OB History     Gravida  3   Para  3   Term  3   Preterm  0   AB  0   Living  3      SAB  0   IAB  0   Ectopic  0   Multiple      Live Births  3            Home Medications    Prior to Admission medications  Medication Sig Start Date End Date Taking? Authorizing Provider  citalopram  (CELEXA ) 40 MG tablet Take 1 tablet (40 mg total) by mouth daily. 07/09/24  Yes  Theotis Haze ORN, NP  meloxicam  (MOBIC ) 15 MG tablet Take 1 tablet (15 mg total) by mouth daily. Patient not taking: Reported on 08/06/2024 08/02/24 08/02/25  Williams, Megan E, NP  methocarbamol  (ROBAXIN ) 500 MG tablet Take 1 tablet (500 mg total) by mouth 3 (three) times daily. Patient not taking: Reported on 08/06/2024 08/02/24   Williams, Megan E, NP  predniSONE  (DELTASONE ) 50 MG tablet Take 1 tablet (50 mg total) by mouth daily with breakfast. Take until completed. Patient not taking: Reported on 08/06/2024 08/02/24   Trudy Duwaine BRAVO, NP    Family History Family History  Problem Relation Age of Onset   Diabetes Mother    Diabetes Sister    Diabetes Brother    Heart disease Other    Breast cancer Neg Hx     Social History Social History[1]   Allergies   Naproxen    Review of Systems Review of Systems  HENT:  Positive for ear discharge.   Respiratory:  Positive for cough.   All other systems reviewed and are negative.    Physical Exam Triage Vital Signs ED Triage Vitals  Encounter Vitals Group     BP 08/06/24 0930 (!) 141/84  Girls Systolic BP Percentile --      Girls Diastolic BP Percentile --      Boys Systolic BP Percentile --      Boys Diastolic BP Percentile --      Pulse Rate 08/06/24 0930 84     Resp 08/06/24 0930 16     Temp 08/06/24 0930 98.3 F (36.8 C)     Temp Source 08/06/24 0930 Oral     SpO2 08/06/24 0930 97 %     Weight --      Height --      Head Circumference --      Peak Flow --      Pain Score 08/06/24 0931 8     Pain Loc --      Pain Education --      Exclude from Growth Chart --    No data found.  Updated Vital Signs BP (!) 141/84 (BP Location: Left Arm)   Pulse 84   Temp 98.3 F (36.8 C) (Oral)   Resp 16   LMP 02/27/2021   SpO2 97%   Visual Acuity Right Eye Distance:   Left Eye Distance:   Bilateral Distance:    Right Eye Near:   Left Eye Near:    Bilateral Near:     Physical Exam Vitals and nursing note  reviewed.  Constitutional:      General: She is not in acute distress.    Appearance: She is well-developed.  HENT:     Head: Normocephalic and atraumatic.     Mouth/Throat:     Mouth: Mucous membranes are moist.     Pharynx: Posterior oropharyngeal erythema present. No oropharyngeal exudate.     Comments: Erythematous oropharynx with no appreciable tonsillar exudate or drainage present.  Uvula midline. Eyes:     General:        Right eye: Discharge present.        Left eye: Discharge present.    Extraocular Movements: Extraocular movements intact.     Pupils: Pupils are equal, round, and reactive to light.     Comments: Conjunctival injection left greater than right, watery drainage present in bilateral eyes.   Cardiovascular:     Rate and Rhythm: Normal rate and regular rhythm.     Heart sounds: No murmur heard. Pulmonary:     Effort: Pulmonary effort is normal. No respiratory distress.     Breath sounds: Normal breath sounds.  Abdominal:     Palpations: Abdomen is soft.     Tenderness: There is no abdominal tenderness.  Musculoskeletal:        General: No swelling.     Cervical back: Neck supple.  Skin:    General: Skin is warm and dry.     Capillary Refill: Capillary refill takes less than 2 seconds.  Neurological:     Mental Status: She is alert.  Psychiatric:        Mood and Affect: Mood normal.      UC Treatments / Results  Labs (all labs ordered are listed, but only abnormal results are displayed) Labs Reviewed  POC COVID19/FLU A&B COMBO - Normal    EKG   Radiology No results found.  Procedures Procedures (including critical care time)  Medications Ordered in UC Medications - No data to display  Initial Impression / Assessment and Plan / UC Course  I have reviewed the triage vital signs and the nursing notes.  Pertinent labs & imaging results that were available during my care  of the patient were reviewed by me and considered in my medical  decision making (see chart for details).     This patient presents to the UC for concern of congestion, sore throat, eye irritation. Differential diagnosis includes COVID-19, viral URI, influenza, strep, conjunctivitis    Additional history obtained:  Additional history obtained from chart review   Lab Tests:  I Ordered, and personally interpreted labs.  The pertinent results include:  COVID-19 negative, influenza A/B negative   Problem List / UC Course:  Patient without significant medical history presents to urgent care today with concerns of congestion, watery and irritated eyes and sore throat.  Reports symptoms ongoing for the last 3 days.  States that she developed a cough this morning.  No reported fevers.  Denies any sick contacts.  No chest pain, shortness of breath, nausea, vomiting, or diarrhea. Exam reveals erythematous oropharynx but no tonsillar exudate seen.  There is no appreciable cervical chain adenopathy.  Uvula is midline.  No abnormal heart or lung sounds. Given likely viral URI, will obtain COVID-19 and influenza A/B rapid testing.  Low concern for strep given Centor criteria negative. Patient's viral testing today is negative.  I suspect likely viral upper respiratory infection currently causing her symptoms.  Advised continued over-the-counter symptomatic management.  Conjunctival findings can be seen in viral infection so low concern for bacterial conjunctivitis.  I did advise patient if she has any history of seasonal allergies that an antihistamine may be of benefit over the next several days to try to reduce the itching and watering in her eyes and ears that she has been endorsing.  Encourage close follow-up with her PCP.  She is otherwise stable for outpatient follow-up and discharged home.   Social Determinants of Health:    Final Clinical Impressions(s) / UC Diagnoses   Final diagnoses:  Acute upper respiratory infection     Discharge Instructions       Hoy la atendimos por congestin nasal e irritacin ocular. La prueba de COVID-19 y de influenza dio negativa, y sospecho que sus sntomas probablemente se deban a una infeccin respiratoria viral. Ladora recomiendo que contine controlando sus sntomas en casa con medicamentos de venta libre como Tylenol , ibuprofeno y Mucinex. Tambin puede tomar un antihistamnico como Claritin o Zyrtec para technical sales engineer picazn en los ojos y los odos. Si presenta sntomas nuevos o un empeoramiento de los sntomas, consulte con su mdico de information systems manager. Acuda a la sala de emergencias ms cercana si presenta sntomas graves como dificultad para respirar, engineer, mining en el pecho o debilidad importante.   You were seen today for concerns of congestion and eye irritation.  You were negative for COVID-19, influenza, and I suspect your symptoms are likely due to a viral respiratory infection causing the symptoms.  I would recommend continuing managing your symptoms at home with over-the-counter medications such as Tylenol , ibuprofen , Mucinex.  You can also add an antihistamine such as Claritin or Zyrtec to help with the itching in your eyes and ears.  For any concerns of new or worsening symptoms, please follow-up closely with your primary care provider.  Go to the nearest emergency department for any concerns of worsening symptoms such as shortness of breath, chest pain, or significant weakness.     ED Prescriptions   None    PDMP not reviewed this encounter.     [1]  Social History Tobacco Use   Smoking status: Never   Smokeless tobacco: Never  Vaping Use   Vaping  status: Never Used  Substance Use Topics   Alcohol use: No   Drug use: No     Kiya Eno A, PA-C 08/06/24 1015  "

## 2024-08-06 NOTE — ED Notes (Signed)
 Spanish interpreter used for d/c teaching: Foy [phone]

## 2024-08-23 ENCOUNTER — Encounter: Payer: Self-pay | Admitting: Physical Therapy

## 2024-08-23 ENCOUNTER — Ambulatory Visit (INDEPENDENT_AMBULATORY_CARE_PROVIDER_SITE_OTHER): Payer: Self-pay | Admitting: Physical Therapy

## 2024-08-23 DIAGNOSIS — M6281 Muscle weakness (generalized): Secondary | ICD-10-CM

## 2024-08-23 DIAGNOSIS — R293 Abnormal posture: Secondary | ICD-10-CM

## 2024-08-23 DIAGNOSIS — M542 Cervicalgia: Secondary | ICD-10-CM

## 2024-08-23 NOTE — Therapy (Signed)
 " OUTPATIENT PHYSICAL THERAPY CERVICAL EVALUATION   Patient Name: Jennifer Noble MRN: [id number] DOB:08/02/1976, 49 y.o., female Today's Date: 08/23/2024  END OF SESSION:  PT End of Session - 08/23/24 1025     Visit Number 1    Number of Visits 16    Date for Recertification  11/05/24    PT Start Time 1020    PT Stop Time 1059    PT Time Calculation (min) 39 min    Activity Tolerance Patient tolerated treatment well    Behavior During Therapy [facility] for tasks assessed/performed          Past Medical History:  Diagnosis Date   Anxiety    Depression    Dysuria    Hemorrhoid    High cholesterol    Past Surgical History:  Procedure Laterality Date   GLUTEUS MINIMUS REPAIR Left 01/05/2024   Procedure: REPAIR, TENDON, GLUTEUS MINIMUS;  Surgeon: Genelle Standing, MD;  Location: [facility];  Service: Orthopedics;  Laterality: Left;  LEFT GLUTEUS MEDIUS REPAIR AND POSSIBLE COLLAGEN PATCH AUGMENTATION   HEMORRHOID SURGERY     Patient Active Problem List   Diagnosis Date Noted   Tear of left gluteus minimus tendon 01/05/2024   Vasomotor symptoms due to menopause 12/03/2022   Headache 05/07/2016   Depression 08/17/2015   Insomnia 08/17/2015   Elevated lipids 04/08/2013   Family history of early CAD 04/08/2013   Hemorrhoid    Dysuria     PCP: Theotis Haze ORN, NP   REFERRING PROVIDER: Trudy Duwaine BRAVO, NP   REFERRING DIAG:  M54.2 (ICD-10-CM) - Neck pain  M54.6 (ICD-10-CM) - Acute bilateral thoracic back pain  M54.50 (ICD-10-CM) - Acute bilateral low back pain without sciatica  M79.18 (ICD-10-CM) - Myofascial pain syndrome    THERAPY DIAG:  Cervicalgia  Muscle weakness (generalized)  Abnormal posture  Rationale for Evaluation and Treatment: Rehabilitation  ONSET DATE: 1.5 months ago  SUBJECTIVE:                                                                                                                                                                                                          SUBJECTIVE STATEMENT: Pain started 1.5 months ago after mechanical fall. States she tripped over belt on the floor and hit head on wall. She was evaluated in the emergency department next day for evaluation. Right shoulder x-ray in ED was normal. Pt still reporting pain in her left side neck and thoracic spine.   PERTINENT HISTORY:  See PMH above  PAIN:  NPRS scale:  9/10, can reach 10/10 Pain location: thoracic spine, cervical spine, low back Pain description: achy Aggravating factors: lifting, turning Relieving factors: resting  PRECAUTIONS: None  RED FLAGS: None  WEIGHT BEARING RESTRICTIONS: No  FALLS:  Has patient fallen in last 6 months? No  LIVING ENVIRONMENT: Lives with: lives with their family and lives with their spouse Lives in: House/apartment Stairs: Yes: External: 3 steps; can reach both Has following equipment at home: None  OCCUPATION: [employer]  PLOF: Independent  PATIENT GOALS: Stop hurting  Next MD visit:  OBJECTIVE:   DIAGNOSTIC FINDINGS:  Rt shoulder clear  PATIENT SURVEYS: Patient-Specific Activity Scoring Scheme  0 represents unable to perform. 10 represents able to perform at prior level. 0 1 2 3 4 5 6 7 8 9  10 (Date and Score)   Activity Eval  08/23/24    1. Moving objects  8    2. Lifting objects  8    3.     4.    5.    Score 8    Total score = sum of the activity scores/number of activities Minimum detectable change (90%CI) for average score = 2 points Minimum detectable change (90%CI) for single activity score = 3 points    COGNITION: Overall cognitive status: Within functional limits for tasks assessed  SENSATION: WFL  POSTURE:  rounded shoulders and forward head  PALPATION: 08/23/24 TTP left levator scapula and left rrhomboids with active trigger points noted.    CERVICAL ROM:   ROM AROM (deg) Eval 08/23/24  Flexion 30 pain bil cervical spine   Extension 32 pain on left  Right lateral flexion 20, pain on left  Left lateral flexion 28  Right rotation 45  Left rotation 48 pain down left side   (Blank rows = not tested)  UPPER EXTREMITY MMT:   MMT  Rt hand dominant Right Eval 08/23/24 HHD  sitting Left Eval 08/23/24 HHD  sitting  Shoulder flexion 22.0 20.6 21.1 19.1  Shoulder extension    Shoulder abduction 21.9 22.2 20.5 19.2  Shoulder adduction    Shoulder extension    Shoulder internal rotation    Shoulder external rotation 13.4 12.2 16.0 16.4  Elbow flexion    Elbow extension    Wrist flexion    Wrist extension    Wrist ulnar deviation    Wrist radial deviation    Wrist pronation    Wrist supination     (Blank rows = not tested)  UPPER EXTREMITY ROM:  ROM Right eval Left eval  Shoulder flexion [facility]  Shoulder extension [facility]  Shoulder abduction    Shoulder adduction    Shoulder extension    Shoulder internal rotation    Shoulder external rotation     (Blank rows = not tested)  TODAY'S TREATMENT:                                                                                                       DATE:  08/23/24 Therex:    HEP instruction/performance c cues for techniques, handout provided.  Trial set performed of each for comprehension and symptom assessment.  See below for exercise list  PATIENT EDUCATION:  Education details: HEP, POC Person educated: Patient Education method: Explanation, Demonstration, Verbal cues, and Handouts Education comprehension: verbalized understanding, returned demonstration, and verbal cues required  HOME EXERCISE PROGRAM: Access Code: [email or web address] URL: [email or web address] Date: 08/23/2024 Prepared by: Delon Lunger  Exercises - Seated Scapular Retraction  - 2  x daily - 7 x weekly - 15 reps - 5 seconds hold - Supine Lower Trunk Rotation  - 2 x daily - 7 x weekly - 3 reps - 30 seconds hold - Sidelying Thoracic Rotation with Open Book  - 2 x daily - 7 x weekly - 3 reps - 30 seconds hold - Supine Cervical Retraction with Towel  - 2 x daily - 7 x weekly - 10 reps - 5 seconds hold - Gentle Levator Scapulae Stretch  - 2 x daily - 7 x weekly - 3 reps - 10 seconds hold  ASSESSMENT:  CLINICAL IMPRESSION: Patient is a 49 y.o. who comes to clinic with complaints of cervical and low back pain with mobility, strength and movement coordination deficits that impair their ability to perform usual daily and recreational functional activities without increase difficulty/symptoms at this time.  Patient to benefit from skilled PT services to address impairments and limitations to improve to previous level of function without restriction secondary to condition.    OBJECTIVE IMPAIRMENTS: decreased mobility, difficulty walking, decreased ROM, decreased strength, postural dysfunction, and pain.   ACTIVITY LIMITATIONS: lifting, bending, sitting, standing, squatting, sleeping, and hygiene/grooming  PARTICIPATION LIMITATIONS: cleaning, laundry, community activity, and occupation  PERSONAL FACTORS: see PMH above are also affecting patient's functional outcome.   REHAB POTENTIAL: Good  CLINICAL DECISION MAKING: Stable/uncomplicated  EVALUATION COMPLEXITY: Low   GOALS: Goals reviewed with patient? Yes  SHORT TERM GOALS: (target date for Short term goals are 3 weeks 09/13/2024)  1.Patient will demonstrate independent use of home exercise program to maintain progress from in clinic treatments. Goal status: New  LONG TERM GOALS: (target dates for all long term goals are 10 weeks  11/01/2024 )   1. Patient will demonstrate/report pain at worst less than or equal to 2/10 to facilitate minimal limitation in daily activity secondary to pain symptoms. Goal status: New    2. Patient will demonstrate independent use of home exercise program to facilitate ability to maintain/progress functional gains from skilled physical therapy services. Goal status: New   3. Patient will demonstrate Patient specific functional scale avg > or = >/= 9.5 to indicate reduced disability due to condition.  Goal status: New   4.  Patient will demonstrate cervical rotation AROM  >/= 60 deg bilaterally for improved functional mobility and driving.   Goal status: New   5.  Pt  will improve shoulder flexion and abduction MMT using HHD by >/= 5 pounds in order to improve ADLs and functional mobility.   Goal status: New      PLAN:  PT FREQUENCY: 1x/week  PT DURATION: 10 weeks  Can include 02853- PT Re-evaluation, 97110-Therapeutic exercises, 97530- Therapeutic activity, [id number]- Neuromuscular re-education, 97535- Self Care, 97140- Manual therapy, [phone]- Gait training, [phone]- Orthotic Fit/training, [id number]- Canalith repositioning, [id number]- Aquatic Therapy, [phone]- Electrical stimulation (unattended), [id number] Physical performance testing, 97016- Vasopneumatic device, [id number]- Ultrasound, [id number]- Traction (mechanical), [id number]- Ionotophoresis 4mg /ml Dexamethasone ,  79439 - Needle insertion w/o injection 1 or 2 muscles, 20561 - Needle insertion w/o injection 3 or more muscles.   Patient/Family education, Balance training, Stair training, Taping, Dry Needling, Joint mobilization, Joint manipulation, Spinal manipulation, Spinal mobilization, Scar mobilization, Vestibular training, Visual/preceptual remediation/compensation, DME instructions, Cryotherapy, and Moist heat.  All performed as medically necessary.  All included unless contraindicated  PLAN FOR NEXT SESSION: Check HEP use/response, cervical ROM and strengthening, thoracic mobility, try Nustep vs Scifit,      Delon JONELLE Lunger, PT, MPT 08/23/2024, 2:22 PM      "

## 2024-09-10 ENCOUNTER — Encounter: Payer: Self-pay | Admitting: Physical Therapy

## 2024-09-10 ENCOUNTER — Ambulatory Visit: Payer: Self-pay | Admitting: Physical Therapy

## 2024-09-10 DIAGNOSIS — R293 Abnormal posture: Secondary | ICD-10-CM

## 2024-09-10 DIAGNOSIS — M542 Cervicalgia: Secondary | ICD-10-CM

## 2024-09-10 DIAGNOSIS — M7062 Trochanteric bursitis, left hip: Secondary | ICD-10-CM

## 2024-09-10 DIAGNOSIS — M6281 Muscle weakness (generalized): Secondary | ICD-10-CM

## 2024-09-10 DIAGNOSIS — M25552 Pain in left hip: Secondary | ICD-10-CM

## 2024-09-10 NOTE — Therapy (Signed)
 " OUTPATIENT PHYSICAL THERAPY CERVICAL TREATMENT   Patient Name: Jennifer Noble MRN: [id number] DOB:1975/10/13, 49 y.o., female Today's Date: 09/10/2024  END OF SESSION:  PT End of Session - 09/10/24 0924     Visit Number 2    Number of Visits 16    Date for Recertification  11/05/24    PT Start Time 0930    PT Stop Time 1010    PT Time Calculation (min) 40 min    Activity Tolerance Patient tolerated treatment well    Behavior During Therapy [facility] for tasks assessed/performed           Past Medical History:  Diagnosis Date   Anxiety    Depression    Dysuria    Hemorrhoid    High cholesterol    Past Surgical History:  Procedure Laterality Date   GLUTEUS MINIMUS REPAIR Left 01/05/2024   Procedure: REPAIR, TENDON, GLUTEUS MINIMUS;  Surgeon: Genelle Standing, MD;  Location: [facility];  Service: Orthopedics;  Laterality: Left;  LEFT GLUTEUS MEDIUS REPAIR AND POSSIBLE COLLAGEN PATCH AUGMENTATION   HEMORRHOID SURGERY     Patient Active Problem List   Diagnosis Date Noted   Tear of left gluteus minimus tendon 01/05/2024   Vasomotor symptoms due to menopause 12/03/2022   Headache 05/07/2016   Depression 08/17/2015   Insomnia 08/17/2015   Elevated lipids 04/08/2013   Family history of early CAD 04/08/2013   Hemorrhoid    Dysuria     PCP: Theotis Haze ORN, NP   REFERRING PROVIDER: Trudy Duwaine BRAVO, NP   REFERRING DIAG:  M54.2 (ICD-10-CM) - Neck pain  M54.6 (ICD-10-CM) - Acute bilateral thoracic back pain  M54.50 (ICD-10-CM) - Acute bilateral low back pain without sciatica  M79.18 (ICD-10-CM) - Myofascial pain syndrome    THERAPY DIAG:  Cervicalgia  Muscle weakness (generalized)  Abnormal posture  Pain in left hip  Trochanteric bursitis of left hip  Rationale for Evaluation and Treatment: Rehabilitation  ONSET DATE: 1.5 months ago  SUBJECTIVE:                                                                                                                                                                                                          SUBJECTIVE STATEMENT: Pt reports she feels better. Has been doing the exercises at home. Pt states not feeling much pain in her neck this morning. Pt states when she looks up is when she feels the pain (4/10). Turning she can feel it pulling on her L side (along ribs) and in the back  of her neck.    From eval: Pain started 1.5 months ago after mechanical fall. States she tripped over belt on the floor and hit head on wall. She was evaluated in the emergency department next day for evaluation. Right shoulder x-ray in ED was normal. Pt still reporting pain in her left side neck and thoracic spine.   PERTINENT HISTORY:  See PMH above  PAIN:  NPRS scale: 9/10, can reach 10/10 Pain location: thoracic spine, cervical spine, low back Pain description: achy Aggravating factors: lifting, turning Relieving factors: resting  PRECAUTIONS: None  RED FLAGS: None  WEIGHT BEARING RESTRICTIONS: No  FALLS:  Has patient fallen in last 6 months? No  LIVING ENVIRONMENT: Lives with: lives with their family and lives with their spouse Lives in: House/apartment Stairs: Yes: External: 3 steps; can reach both Has following equipment at home: None  OCCUPATION: [employer]  PLOF: Independent  PATIENT GOALS: Stop hurting  Next MD visit:  OBJECTIVE:   DIAGNOSTIC FINDINGS:  Rt shoulder clear  PATIENT SURVEYS: Patient-Specific Activity Scoring Scheme  0 represents unable to perform. 10 represents able to perform at prior level. 0 1 2 3 4 5 6 7 8 9  10 (Date and Score)   Activity Eval  08/23/24    1. Moving objects  8    2. Lifting objects  8    3.     4.    5.    Score 8    Total score = sum of the activity scores/number of activities Minimum detectable change (90%CI) for average score = 2 points Minimum detectable change (90%CI) for single activity score = 3  points    COGNITION: Overall cognitive status: Within functional limits for tasks assessed  SENSATION: WFL  POSTURE:  rounded shoulders and forward head  PALPATION: 08/23/24 TTP left levator scapula and left rrhomboids with active trigger points noted.    CERVICAL ROM:   ROM AROM (deg) Eval 08/23/24  Flexion 30 pain bil cervical spine  Extension 32 pain on left  Right lateral flexion 20, pain on left  Left lateral flexion 28  Right rotation 45  Left rotation 48 pain down left side   (Blank rows = not tested)  UPPER EXTREMITY MMT:   MMT  Rt hand dominant Right Eval 08/23/24 HHD  sitting Left Eval 08/23/24 HHD  sitting  Shoulder flexion 22.0 20.6 21.1 19.1  Shoulder extension    Shoulder abduction 21.9 22.2 20.5 19.2  Shoulder adduction    Shoulder extension    Shoulder internal rotation    Shoulder external rotation 13.4 12.2 16.0 16.4  Elbow flexion    Elbow extension    Wrist flexion    Wrist extension    Wrist ulnar deviation    Wrist radial deviation    Wrist pronation    Wrist supination     (Blank rows = not tested)  UPPER EXTREMITY ROM:  ROM Right eval Left eval  Shoulder flexion [facility]  Shoulder extension [facility]  Shoulder abduction    Shoulder adduction    Shoulder extension    Shoulder internal rotation    Shoulder external rotation     (Blank rows = not tested)  TODAY'S TREATMENT:                                                                                                       DATE:  09/10/24 Therex: Nustep L6 x 10 min UEs/LEs Seated scapular retraction x10 Seated levator scap stretch x30 R&L Seated thoracic extension against ball 5x5 Seated cervical retraction x10 Seated cervical ext with pillowcase assist x10 Seated thoracic rotation +  lateral flexion with deep diaphragmatic breaths  Seated thoracic flexion into R rotation with arms in bear hug Seated shoulder external rotation 2x10 Seated W 2x10 Self care:  self massage with tennis ball  Manual Therapy: STM & TPR L periscapular muscles -- multiple trigger points in mid and low traps, rhomboids, infraspinatus, and teres minor/lats   08/23/24 Therex:    HEP instruction/performance c cues for techniques, handout provided.  Trial set performed of each for comprehension and symptom assessment.  See below for exercise list  PATIENT EDUCATION:  Education details: HEP, POC Person educated: Patient Education method: Explanation, Demonstration, Verbal cues, and Handouts Education comprehension: verbalized understanding, returned demonstration, and verbal cues required  HOME EXERCISE PROGRAM: Access Code: [email or web address] URL: [email or web address] Date: 09/10/2024 Prepared by: Jedrick Hutcherson April Earnie Starring  Exercises - Seated Scapular Retraction  - 2 x daily - 7 x weekly - 15 reps - 5 seconds hold - Sidelying Thoracic Rotation with Open Book  - 2 x daily - 7 x weekly - 3 reps - 30 seconds hold - Supine Cervical Retraction with Towel  - 2 x daily - 7 x weekly - 10 reps - 5 seconds hold - Cervical Extension AROM with Strap  - 2 x daily - 7 x weekly - 1 sets - 10 reps - Seated Thoracic Flexion and Rotation with Arms Crossed  - 1 x daily - 7 x weekly - 2 sets - 30 sec hold - Seated Thoracic Flexion and Extension  - 1 x daily - 7 x weekly - 1 sets - 5 reps - 5 sec hold - Shoulder External Rotation and Scapular Retraction  - 1 x daily - 7 x weekly - 2 sets - 10 reps - Shoulder External Rotation in 45 Degrees Abduction  - 1 x daily - 7 x weekly - 2 sets - 10 reps - Standing Upper Trapezius Mobilization with Small Ball  - 1 x daily - 7 x weekly - 2 sets - 2 min hold  ASSESSMENT:  CLINICAL IMPRESSION: Reviewed HEP. Pain is now more along the lower cervical and L  midback/periscapular muscles. Worked more on thoracic mobility and initiated gentle midback strengthening this session. Manual to address her trigger points. Pt tolerated well. Provided tennis ball to work on her trigger points independently at home.   From eval: Patient is a 49 y.o. who comes to clinic with complaints of cervical and low back pain with mobility, strength and movement coordination deficits that impair their ability to perform usual daily and recreational functional activities without increase difficulty/symptoms at this time.  Patient to benefit from skilled PT services to address impairments and limitations  to improve to previous level of function without restriction secondary to condition.    OBJECTIVE IMPAIRMENTS: decreased mobility, difficulty walking, decreased ROM, decreased strength, postural dysfunction, and pain.   ACTIVITY LIMITATIONS: lifting, bending, sitting, standing, squatting, sleeping, and hygiene/grooming  PARTICIPATION LIMITATIONS: cleaning, laundry, community activity, and occupation  PERSONAL FACTORS: see PMH above are also affecting patient's functional outcome.   REHAB POTENTIAL: Good  CLINICAL DECISION MAKING: Stable/uncomplicated  EVALUATION COMPLEXITY: Low   GOALS: Goals reviewed with patient? Yes  SHORT TERM GOALS: (target date for Short term goals are 3 weeks 09/13/2024)  1.Patient will demonstrate independent use of home exercise program to maintain progress from in clinic treatments. Goal status: New  LONG TERM GOALS: (target dates for all long term goals are 10 weeks  11/01/2024 )   1. Patient will demonstrate/report pain at worst less than or equal to 2/10 to facilitate minimal limitation in daily activity secondary to pain symptoms. Goal status: New   2. Patient will demonstrate independent use of home exercise program to facilitate ability to maintain/progress functional gains from skilled physical therapy services. Goal status: New    3. Patient will demonstrate Patient specific functional scale avg > or = >/= 9.5 to indicate reduced disability due to condition.  Goal status: New   4.  Patient will demonstrate cervical rotation AROM  >/= 60 deg bilaterally for improved functional mobility and driving.   Goal status: New   5.  Pt will improve shoulder flexion and abduction MMT using HHD by >/= 5 pounds in order to improve ADLs and functional mobility.   Goal status: New      PLAN:  PT FREQUENCY: 1x/week  PT DURATION: 10 weeks  Can include 02853- PT Re-evaluation, 97110-Therapeutic exercises, 97530- Therapeutic activity, [id number]- Neuromuscular re-education, 97535- Self Care, 97140- Manual therapy, [phone]- Gait training, [phone]- Orthotic Fit/training, [phone]- Canalith repositioning, [id number]- Aquatic Therapy, [phone]- Electrical stimulation (unattended), [id number] Physical performance testing, 97016- Vasopneumatic device, [id number]- Ultrasound, [id number]- Traction (mechanical), [id number]- Ionotophoresis 4mg /ml Dexamethasone ,  79439 - Needle insertion w/o injection 1 or 2 muscles, 20561 - Needle insertion w/o injection 3 or more muscles.   Patient/Family education, Balance training, Stair training, Taping, Dry Needling, Joint mobilization, Joint manipulation, Spinal manipulation, Spinal mobilization, Scar mobilization, Vestibular training, Visual/preceptual remediation/compensation, DME instructions, Cryotherapy, and Moist heat.  All performed as medically necessary.  All included unless contraindicated  PLAN FOR NEXT SESSION: Check HEP use/response, cervical ROM and strengthening, thoracic mobility, Nustep. Midback/periscapular strengthening and mobility      Khaila Velarde April Ma L Cressey, PT, DPT 09/10/2024, 9:26 AM      "

## 2024-09-17 ENCOUNTER — Encounter: Payer: Self-pay | Admitting: Physical Therapy

## 2024-09-17 ENCOUNTER — Ambulatory Visit (INDEPENDENT_AMBULATORY_CARE_PROVIDER_SITE_OTHER): Payer: Self-pay | Admitting: Physical Therapy

## 2024-09-17 DIAGNOSIS — M6281 Muscle weakness (generalized): Secondary | ICD-10-CM

## 2024-09-17 DIAGNOSIS — R293 Abnormal posture: Secondary | ICD-10-CM

## 2024-09-17 DIAGNOSIS — M542 Cervicalgia: Secondary | ICD-10-CM

## 2024-10-11 ENCOUNTER — Ambulatory Visit: Payer: Self-pay | Admitting: Physical Medicine and Rehabilitation

## 2024-10-18 ENCOUNTER — Ambulatory Visit: Payer: Self-pay | Admitting: Nurse Practitioner
